# Patient Record
Sex: Male | Born: 1970
Health system: Southern US, Community
[De-identification: ages and names within clinical notes are randomized; demographics above are authoritative.]

## PROBLEM LIST (undated history)

## (undated) DIAGNOSIS — J45909 Unspecified asthma, uncomplicated: Secondary | ICD-10-CM

## (undated) DIAGNOSIS — E662 Morbid (severe) obesity with alveolar hypoventilation: Secondary | ICD-10-CM

## (undated) DIAGNOSIS — I5032 Chronic diastolic (congestive) heart failure: Secondary | ICD-10-CM

## (undated) DIAGNOSIS — J449 Chronic obstructive pulmonary disease, unspecified: Secondary | ICD-10-CM

## (undated) DIAGNOSIS — I509 Heart failure, unspecified: Secondary | ICD-10-CM

## (undated) DIAGNOSIS — E669 Obesity, unspecified: Secondary | ICD-10-CM

## (undated) DIAGNOSIS — I10 Essential (primary) hypertension: Secondary | ICD-10-CM

## (undated) HISTORY — DX: Essential (primary) hypertension: I10

## (undated) HISTORY — PX: NO PAST SURGERIES: SHX2092

## (undated) HISTORY — DX: Chronic diastolic (congestive) heart failure: I50.32

## (undated) HISTORY — DX: Morbid (severe) obesity with alveolar hypoventilation: E66.2

## (undated) NOTE — *Deleted (*Deleted)
NAME:  Joseph Morales, MRN:  [id number], DOB:  09-23-1971, LOS: 1 ADMISSION DATE:  10/16/2020, CONSULTATION DATE: 10/17/20 REFERRING MD:  Dr. Rhona Leavens, CHIEF COMPLAINT:  AMS   Brief History   6 y/o M admitted 10/19 with reports of increasing SOB, DOE, orthopnea and LE swelling thought secondary to decompensated diastolic CHF, OSA/OHS.    History of present illness   42 y/o M who presented to [facility] on 10/19 with reports of 1 week of increasing shortness of breath, dyspnea with exertion, inability to lie flat and LE swelling.   He has a PMH of diastolic CHF (LVEF 70-75% 05/2020), asthma, OHS, and obesity with BMI of 47.25.  He was admitted from 6/24-6/29/21 with acute hypoxic respiratory failure in the setting of dCHF and OHS.  He was diuresed 2L and was discharged on [place] O2 at 2L.   The patient reported on admit that he had been trying to follow his prescribed diet but had not as closely in the last few months.  Initial work up notable for UDS positive for THC, BNP 225, Trop 54, bicarb on BMP 36, WBC 21, Hgb 15.6 and platelets of 304.  CXR 10/19 with mild hilar fullness, interstitial edema.  He was admitted per [facility] for evaluation of suspected decompensated dCHF.  The patient was diuresed (no I/O's documented) and home antihypertensive regimen was continued.     On 10/20 am, RRT was called for evaluation of altered mental status. Per staff report, the patient's O2 was increased to 15L overnight to maintain saturations.  He was placed on NIV per the rapid response team.  RT at bedside, ABG assessed with pH 7.1 / pCO2 >90.  The patient was somnolent but aroused to deep pain.  Asterixis noted on exam.  He was transferred to ICU and placed on AVAPS.   10/21, Patient is more alert and oriented than 10/20. He states he has a CPAP at home but does not wear it because it's uncomfortable. He denies SOB and Chest pain.  Past Medical History  Obesity  Diastolic CHF - LVEF 70-75% Ashtma  Former Smoker - 10pk yr hx  HTN HLD  OHS  Significant Hospital Events   10/19 Admit  10/20 Tx to ICU for hypercarbic   Consults:    Procedures:    Significant Diagnostic Tests:  10/19 CTA Chest >> negative for PE, airway inflammation with mild bronchial wall thickening, small hiatal hernia, non-specific perinephric stranding within the visualized abdomen 10/21 CXR >> shallow inspiration. Infiltration or atelectasis in left lung base.  Micro Data:  COVID 10/19 >> negative  Influenza A/B 10/19 >> negative   Antimicrobials:    Interim history/subjective:  Afebrile / WBC 20.6  Glucose trend 84-103 I/O UOP recorded   Objective   Blood pressure (!) 158/61, pulse 83, temperature 97.7 F (36.5 C), temperature source Oral, resp. rate (!) 24, height 6\' 3"  (1.905 m), weight (!) 168.1 kg, SpO2 96 %.        Intake/Output Summary (Last 24 hours) at 10/18/2020 0831 Last data filed at 10/18/2020 0500 Gross per 24 hour  Intake -  Output 2560 ml  Net -2560 ml   Filed Weights   10/16/20 1319 10/17/20 0900 10/18/20 0500  Weight: (!) 171.5 kg (!) 172.3 kg (!) 168.1 kg    Examination: General: adult male lying in bed in NAD, somnolent HEENT: MM pink/dry, BiPAP mask in place, anicteric  Neuro: Alert to commands; follows commands CV: s1s2 rrr, no m/r/g PULM: non-labored on  5 liter O2, lungs distant bilaterally with scattered wheezing in right lung GI: soft, bsx4 active, No pain with deep palpation Extremities: warm/dry, 2+ pre-tibial edema  Skin: no rashes or lesions  Resolved Hospital Problem list     Assessment & Plan:   Acute on Chronic Hypoxic / Hypercarbic Respiratory Failure  Suspected OHS / OSA  In setting of suspected non-compliance / decompensated dCHF - Now on Nasal Cannula 5 liters: wean oxygen to O2 sats of 88-95% - Will attempt off BiPAP today as tolerated and place back on BiPAP if mental status worsens today. - BiPAP changed to prn and bedtime with AVAPS and back up rate  -  Increased Lasix to 60 mg BID; Also one time dose 40 mg IV ordered -CXR 10/21 shows LLL infiltrate or atelectasis: Pulmonary toiletry (IS and flutter valve) ordered tid and prn - will need sleep study at discharge. Consider nasal pillows with chin strap for patient compliance.  Acute Decompensation of Diastolic CHF - increased lasix 60 mg IV BID  -follow electrolytes closely with diuresis  -follow up BMP at 2000 to assess K  -monitor I/O's   Asthma without Exacerbation  -continue Dulera  - scheduled DuoNeb q6 and albuterol prn for wheezing  Tobacco Abuse  1ppd habit  -nicotine patch   Leukocytosis  -10/21 WBC 20.6 (gradually coming down) -UA negative -CXR 10/21 LLL infiltrate or atelectasis -if fever, consider blood cultures  -follow up CBC in morning  Hyperkalemia -Lokelma given 10/21 pm -Increased Lasix 60 mg IV BID and one time dose 40 mg IV lasix -recheck BMP later today -Consider 2nd dose lokelma -will consider adjusting losartan if hyperkalemia persist  HTN, HLD -Coreg increased to 6.25 due to HTN -resume home losartan, hydralazine, lasix, atorvastatin once able to take PO's   Small Hiatal Hernia  Noted on CT scan 10/19  -PPI    Best practice:  Diet: NPO except for sips with meds Pain/Anxiety/Delirium protocol (if indicated): n/a VAP protocol (if indicated): n/a DVT prophylaxis: lovenox  GI prophylaxis: PPI  Glucose control: per primary  Mobility: BR, mobilize as tolerated  Code Status: Full Code  Family Communication: *** Disposition: ICU  Labs   CBC: Recent Labs  Lab 10/16/20 1358 10/17/20 0424 10/18/20 0250  WBC 21.0* 23.1* 20.6*  NEUTROABS 15.6*  --   --   HGB 15.6 16.2 16.1  HCT 52.2* 55.2* 56.0*  MCV 100.6* 104.2* 103.5*  PLT 304 300 253    Basic Metabolic Panel: Recent Labs  Lab 10/16/20 1358 10/17/20 0424 10/17/20 2050 10/18/20 0250  NA 141 140 142 142  K 4.7 5.0 6.0* 5.5*  CL 96* 94* 94* 92*  CO2 36* 34* 38* 40*  GLUCOSE  120* 143* 100* 105*  BUN 27* 24* 28* 26*  CREATININE 1.15 1.01 1.11 1.14  CALCIUM 8.8* 8.7* 8.6* 8.9  MG  --  2.6*  --   --    GFR: Estimated Creatinine Clearance: 133.6 mL/min (by C-G formula based on SCr of 1.14 mg/dL). Recent Labs  Lab 10/16/20 1358 10/17/20 0424 10/18/20 0250  WBC 21.0* 23.1* 20.6*    Liver Function Tests: Recent Labs  Lab 10/16/20 1358  AST 25  ALT 39  ALKPHOS 57  BILITOT 1.8*  PROT 7.4  ALBUMIN 3.6   No results for input(s): LIPASE, AMYLASE in the last 168 hours. No results for input(s): AMMONIA in the last 168 hours.  ABG    Component Value Date/Time   PHART 7.158 (LL) 10/17/2020 1327  PCO2ART >120 (HH) 10/17/2020 1327   PO2ART 89.8 10/17/2020 1327   HCO3 41.9 (H) 10/17/2020 1327   O2SAT 95.5 10/17/2020 1327     Coagulation Profile: No results for input(s): INR, PROTIME in the last 168 hours.  Cardiac Enzymes: No results for input(s): CKTOTAL, CKMB, CKMBINDEX, TROPONINI in the last 168 hours.  HbA1C: Hgb A1c MFr Bld  Date/Time Value Ref Range Status  06/23/2020 05:02 AM 5.9 (H) 4.8 - 5.6 % Final    Comment:    (NOTE) Pre diabetes:          5.7%-6.4%  Diabetes:              >6.4%  Glycemic control for   <7.0% adults with diabetes     CBG: Recent Labs  Lab 10/17/20 1618 10/17/20 1949 10/17/20 2341 10/18/20 0414 10/18/20 0801  GLUCAP 70 105* 103* 92 84    Review of Systems:   Unable to complete as pt is altered / hypercarbic.   Past Medical History  He,  has a past medical history of Asthma, CHF (congestive heart failure) (HCC), and Obesity.   Surgical History    Past Surgical History:  Procedure Laterality Date  . NO PAST SURGERIES       Social History   reports that he has quit smoking. His smoking use included cigarettes. He has a 10.00 pack-year smoking history. He has never used smokeless tobacco. He reports current drug use. Drug: Marijuana. He reports that he does not drink alcohol.   Family History    His family history includes Breast cancer in his mother; Congestive Heart Failure in his maternal grandmother; Diabetes in his mother; Emphysema in his maternal grandmother.   Allergies No Known Allergies   Home Medications  Prior to Admission medications   Medication Sig Start Date End Date Taking? Authorizing Provider  albuterol (VENTOLIN HFA) 108 (90 Base) MCG/ACT inhaler Inhale 2 puffs into the lungs every 6 (six) hours as needed for wheezing or shortness of breath. 08/01/20  Yes Anders Simmonds, PA-C  aspirin 81 MG EC tablet Take 1 tablet (81 mg total) by mouth daily. Swallow whole. 08/01/20  Yes Georgian Co M, PA-C  atorvastatin (LIPITOR) 40 MG tablet Take 1 tablet (40 mg total) by mouth daily. 08/01/20 10/16/20 Yes Anders Simmonds, PA-C  carvedilol (COREG) 3.125 MG tablet Take 1 tablet (3.125 mg total) by mouth 2 (two) times daily with a meal. 08/01/20 10/16/20 Yes McClung, Angela M, PA-C  CINNAMON PO Take 1 capsule by mouth daily.   Yes [provider]  furosemide (LASIX) 40 MG tablet Take 1 tablet (40 mg total) by mouth 2 (two) times daily. 08/01/20 10/16/20 Yes McClung, Marzella Schlein, PA-C  hydrALAZINE (APRESOLINE) 50 MG tablet Take 1 tablet (50 mg total) by mouth in the morning and at bedtime. 08/01/20 10/16/20 Yes McClung, Marzella Schlein, PA-C  losartan (COZAAR) 50 MG tablet Take 1 tablet (50 mg total) by mouth daily. 08/01/20 10/16/20 Yes McClung, Marzella Schlein, PA-C  mometasone-formoterol (DULERA) 100-5 MCG/ACT AERO Inhale 2 puffs into the lungs 2 (two) times daily. 08/01/20  Yes McClung, Angela M, PA-C  nicotine (NICODERM CQ - DOSED IN MG/24 HOURS) 21 mg/24hr patch Place 1 patch (21 mg total) onto the skin daily. 06/27/20  Yes Briant Cedar, MD     Critical care time: 56 minutes      Canary Brim, MSN, NP-C, AGACNP-BC [facility] 10/18/2020, 8:31 AM   Please see [email or web address] for pager  details.

## (undated) NOTE — *Deleted (*Deleted)
NAME:  Tramell Piechota, MRN:  [id number], DOB:  [birth year], LOS: 0 ADMISSION DATE:  10/16/2020, CONSULTATION DATE:  *** REFERRING MD:  ***, CHIEF COMPLAINT:  ***   Brief History   33 yo male who presented to [facility] ED with increasing SOB, DOE, orthopnea, and leg swelling over the past week. Likely related to history of CHF, OHS, and asthma.  History of present illness   Patient is a 26 yo male with PMH of diastolic CHF (EF 40-98% by TTE 06/22/2020), asthma, OHS, obesity (BMI 47.25) presents to the [facility] ED on 10/16/2020 with SOB, DOE, orthopnea, and leg swelling. Patient got progressively SOB and fatigued with exertion. Having to sleep sitting up and snores at night. He is having increasing daytime fatigue and HA. He thinks he gained 30 lbs since last admission. Takes Lasix for CHF.   Patient was hospitalized on 06/21/2020-06/26/2020 for hypoxia likely due to diastolic CHF and OHS. He was diuresed and required 2 liters O2 [place] on discharge. Patient admits trying to follow dietary recommendations upon discharge but had a poor diet the last few months.  10/17/2020 Critical Care team called to floor for a rapid response this morning. RT found patient unresponsive on cpap. Withdrawal, disoriented response to sternal rub. O2 sats in the 80s. Assuming patient is obtunded and high CO2. I-Stat blood gas read CO2 >90 and pH of 7.13. Patient was placed on V-60 BiPAP with AVAPS mode and RR of 20 and 60% FiO2. Sats improved to mid 90s. Volume's are improving. Will admit to ICU and repeat ABG in a few hours to reassess if intubation is warranted.   Past Medical History  Obesity CHF Asthma Former smoker - 10 PYH ?HTN ?HLD ?OHS  Significant Hospital Events   10/19 admit to [facility] 10/20 admit to ICU  Consults:  PCCM  Procedures:    Significant Diagnostic Tests:  CXR 10/16/2020 >> No significant findings CTA chest PE 10/16/2020 >> No acute PE. Airway inflammation with mild bronchial wall thickening. Small hiatal  hernia.   Micro Data:  COVID 10/19 >> negative Flu 10/19 >> negative  Antimicrobials:  None  Interim history/subjective:  Tmax 99.3 / WBC 23.1[place] Glucose 143 this morning I/O -350 UOP On CPAP on the floors; rapid response called due to hypoxia and unresponsive to sternal rub. I-STAT blood gas drawn with PCO2 greater than 97 and Ph 7.13. Patient placed on AVAPS with a rate of 20 to help blow off CO2 and avoid intubation. 40 IV lasix given. Awaiting ICU bed.    Objective   Blood pressure (!) 145/70, pulse 93, temperature 97.7 F (36.5 C), temperature source Oral, resp. rate 20, height 6\' 3"  (1.905 m), weight (!) 171.5 kg, SpO2 97 %.        Intake/Output Summary (Last 24 hours) at 10/17/2020 0843 Last data filed at 10/17/2020 0600 Gross per 24 hour  Intake 0 ml  Output 350 ml  Net -350 ml   Filed Weights   10/16/20 1319  Weight: (!) 171.5 kg    Examination:  General: obtunded on CPAP HENT: MM moist Lungs: Diminished BS bilaterally Cardiovascular: s1s2 RRR, no m/r/g Abdomen: soft, ND, No abdominal pain Extremities: pre tibial pitting edema; DP pulses 2+ bilaterally; possible asterixis appreciated in LUE Neuro: unable to follow commands; withdrawal to pain;    Resolved Hospital Problem list   ***  Assessment & Plan:    Acute on chronic respiratory failure with hypoxia Acute on chronic diastolic CHF exacerbation Presumed OSA/OHS - Leave  patient on BiPAP on AVAPS with rate of 20 to blow off CO2. Wean FiO2 to keep sats 88-95% - Repeat ABG in 2 hours - Worsening ABG consider intubation - Improving ABG will leave patient on BiPAP today and tonight and will give breaks as tolerated. - STAT 40 mg IV Lasix given; continue 40 mg IV lasix BID - Repeat CXR - Saline lock 10/20; Monitor strict I/O's and daily weights - Repeat CMP, CBC in the morning - Patient will need a sleep study upon discharge for probable OSA/OHS  Asthma: - No wheezing appreciated today. Will  consider changing Dulera to nebs if not off BiPAP by tonight. Alb prn for wheezing.  Leukocytosis: - No evidence of infection. Tmax 99.3. No IV steroids. Maybe secondary to cigarette smoking. Peripheral smear sent. - Ordering UA - ?Consider blood cultures  - Repeat CBC in the morning  Hypertension: - Resume home Coreg, losartan, hydralazine, lasix - Will change from oral to IV route of administration depending on status of patient  Hyperlipidemia: - Continue atorvastatin.  Tobacco use: - Reports usually smoking 1 pack/day, has not smoked the last 2 weeks. 20 PYH. - Prescribing Nicotine Patch  Small Hiatal Hernia - Seen on CT on 10/19 - Likely non-surgical; will order PPI  Best practice:  Diet: NPO Pain/Anxiety/Delirium protocol (if indicated): None VAP protocol (if indicated): None DVT prophylaxis: Lovenox GI prophylaxis: Zofran Glucose control: None Mobility: bedrest Code Status: Full Family Communication: ***  Disposition: ICU  Labs   CBC: Recent Labs  Lab 10/16/20 1358 10/17/20 0424  WBC 21.0* 23.1*  NEUTROABS 15.6*  --   HGB 15.6 16.2  HCT 52.2* 55.2*  MCV 100.6* 104.2*  PLT 304 300    Basic Metabolic Panel: Recent Labs  Lab 10/16/20 1358 10/17/20 0424  NA 141 140  K 4.7 5.0  CL 96* 94*  CO2 36* 34*  GLUCOSE 120* 143*  BUN 27* 24*  CREATININE 1.15 1.01  CALCIUM 8.8* 8.7*  MG  --  2.6*   GFR: Estimated Creatinine Clearance: 152.6 mL/min (by C-G formula based on SCr of 1.01 mg/dL). Recent Labs  Lab 10/16/20 1358 10/17/20 0424  WBC 21.0* 23.1*    Liver Function Tests: Recent Labs  Lab 10/16/20 1358  AST 25  ALT 39  ALKPHOS 57  BILITOT 1.8*  PROT 7.4  ALBUMIN 3.6   No results for input(s): LIPASE, AMYLASE in the last 168 hours. No results for input(s): AMMONIA in the last 168 hours.  ABG No results found for: PHART, PCO2ART, PO2ART, HCO3, TCO2, ACIDBASEDEF, O2SAT   Coagulation Profile: No results for input(s): INR, PROTIME in  the last 168 hours.  Cardiac Enzymes: No results for input(s): CKTOTAL, CKMB, CKMBINDEX, TROPONINI in the last 168 hours.  HbA1C: Hgb A1c MFr Bld  Date/Time Value Ref Range Status  06/23/2020 05:02 AM 5.9 (H) 4.8 - 5.6 % Final    Comment:    (NOTE) Pre diabetes:          5.7%-6.4%  Diabetes:              >6.4%  Glycemic control for   <7.0% adults with diabetes     CBG: No results for input(s): GLUCAP in the last 168 hours.  Review of Systems:   ***  Past Medical History  He,  has a past medical history of Asthma, CHF (congestive heart failure) (HCC), and Obesity.   Surgical History    Past Surgical History:  Procedure Laterality Date  . NO  PAST SURGERIES       Social History   reports that he has quit smoking. His smoking use included cigarettes. He has a 10.00 pack-year smoking history. He has never used smokeless tobacco. He reports current drug use. Drug: Marijuana. He reports that he does not drink alcohol.   Family History   His family history includes Breast cancer in his mother; Congestive Heart Failure in his maternal grandmother; Diabetes in his mother; Emphysema in his maternal grandmother.   Allergies No Known Allergies   Home Medications  Prior to Admission medications   Medication Sig Start Date End Date Taking? Authorizing Provider  albuterol (VENTOLIN HFA) 108 (90 Base) MCG/ACT inhaler Inhale 2 puffs into the lungs every 6 (six) hours as needed for wheezing or shortness of breath. 08/01/20  Yes Anders Simmonds, PA-C  aspirin 81 MG EC tablet Take 1 tablet (81 mg total) by mouth daily. Swallow whole. 08/01/20  Yes Georgian Co M, PA-C  atorvastatin (LIPITOR) 40 MG tablet Take 1 tablet (40 mg total) by mouth daily. 08/01/20 10/16/20 Yes Anders Simmonds, PA-C  carvedilol (COREG) 3.125 MG tablet Take 1 tablet (3.125 mg total) by mouth 2 (two) times daily with a meal. 08/01/20 10/16/20 Yes McClung, Angela M, PA-C  CINNAMON PO Take 1 capsule by mouth daily.    Yes [provider]  furosemide (LASIX) 40 MG tablet Take 1 tablet (40 mg total) by mouth 2 (two) times daily. 08/01/20 10/16/20 Yes McClung, Marzella Schlein, PA-C  hydrALAZINE (APRESOLINE) 50 MG tablet Take 1 tablet (50 mg total) by mouth in the morning and at bedtime. 08/01/20 10/16/20 Yes McClung, Marzella Schlein, PA-C  losartan (COZAAR) 50 MG tablet Take 1 tablet (50 mg total) by mouth daily. 08/01/20 10/16/20 Yes McClung, Marzella Schlein, PA-C  mometasone-formoterol (DULERA) 100-5 MCG/ACT AERO Inhale 2 puffs into the lungs 2 (two) times daily. 08/01/20  Yes McClung, Angela M, PA-C  nicotine (NICODERM CQ - DOSED IN MG/24 HOURS) 21 mg/24hr patch Place 1 patch (21 mg total) onto the skin daily. 06/27/20  Yes Briant Cedar, MD     Critical care time: ***

---

## 2020-06-21 ENCOUNTER — Emergency Department (HOSPITAL_COMMUNITY): Payer: Self-pay

## 2020-06-21 ENCOUNTER — Encounter (HOSPITAL_COMMUNITY): Payer: Self-pay | Admitting: Emergency Medicine

## 2020-06-21 ENCOUNTER — Other Ambulatory Visit: Payer: Self-pay

## 2020-06-21 ENCOUNTER — Inpatient Hospital Stay (HOSPITAL_COMMUNITY)
Admission: EM | Admit: 2020-06-21 | Discharge: 2020-06-26 | DRG: 291 | Disposition: A | Discharge status: Home / Self Care | Payer: Self-pay | Attending: Internal Medicine | Admitting: Internal Medicine

## 2020-06-21 DIAGNOSIS — Z20822 Contact with and (suspected) exposure to covid-19: Secondary | ICD-10-CM | POA: Diagnosis present

## 2020-06-21 DIAGNOSIS — M79669 Pain in unspecified lower leg: Secondary | ICD-10-CM | POA: Diagnosis present

## 2020-06-21 DIAGNOSIS — J45909 Unspecified asthma, uncomplicated: Secondary | ICD-10-CM | POA: Diagnosis present

## 2020-06-21 DIAGNOSIS — I110 Hypertensive heart disease with heart failure: Principal | ICD-10-CM | POA: Diagnosis present

## 2020-06-21 DIAGNOSIS — Z6841 Body Mass Index (BMI) 40.0 and over, adult: Secondary | ICD-10-CM

## 2020-06-21 DIAGNOSIS — L03116 Cellulitis of left lower limb: Secondary | ICD-10-CM | POA: Diagnosis present

## 2020-06-21 DIAGNOSIS — F17210 Nicotine dependence, cigarettes, uncomplicated: Secondary | ICD-10-CM | POA: Diagnosis present

## 2020-06-21 DIAGNOSIS — I872 Venous insufficiency (chronic) (peripheral): Secondary | ICD-10-CM | POA: Diagnosis present

## 2020-06-21 DIAGNOSIS — Z9119 Patient's noncompliance with other medical treatment and regimen: Secondary | ICD-10-CM

## 2020-06-21 DIAGNOSIS — D72829 Elevated white blood cell count, unspecified: Secondary | ICD-10-CM | POA: Diagnosis present

## 2020-06-21 DIAGNOSIS — I10 Essential (primary) hypertension: Secondary | ICD-10-CM

## 2020-06-21 DIAGNOSIS — L03115 Cellulitis of right lower limb: Secondary | ICD-10-CM | POA: Diagnosis present

## 2020-06-21 DIAGNOSIS — I5033 Acute on chronic diastolic (congestive) heart failure: Secondary | ICD-10-CM | POA: Diagnosis present

## 2020-06-21 DIAGNOSIS — L0390 Cellulitis, unspecified: Secondary | ICD-10-CM | POA: Diagnosis present

## 2020-06-21 DIAGNOSIS — Z79899 Other long term (current) drug therapy: Secondary | ICD-10-CM

## 2020-06-21 DIAGNOSIS — E785 Hyperlipidemia, unspecified: Secondary | ICD-10-CM | POA: Diagnosis present

## 2020-06-21 DIAGNOSIS — R0601 Orthopnea: Secondary | ICD-10-CM | POA: Diagnosis present

## 2020-06-21 DIAGNOSIS — I160 Hypertensive urgency: Secondary | ICD-10-CM | POA: Diagnosis present

## 2020-06-21 DIAGNOSIS — R600 Localized edema: Secondary | ICD-10-CM

## 2020-06-21 DIAGNOSIS — Z0189 Encounter for other specified special examinations: Secondary | ICD-10-CM

## 2020-06-21 DIAGNOSIS — R0902 Hypoxemia: Secondary | ICD-10-CM | POA: Diagnosis present

## 2020-06-21 DIAGNOSIS — R7303 Prediabetes: Secondary | ICD-10-CM | POA: Diagnosis present

## 2020-06-21 DIAGNOSIS — E873 Alkalosis: Secondary | ICD-10-CM | POA: Diagnosis present

## 2020-06-21 DIAGNOSIS — J9601 Acute respiratory failure with hypoxia: Secondary | ICD-10-CM | POA: Diagnosis present

## 2020-06-21 DIAGNOSIS — E662 Morbid (severe) obesity with alveolar hypoventilation: Secondary | ICD-10-CM | POA: Diagnosis present

## 2020-06-21 DIAGNOSIS — I5032 Chronic diastolic (congestive) heart failure: Secondary | ICD-10-CM | POA: Diagnosis present

## 2020-06-21 DIAGNOSIS — Z825 Family history of asthma and other chronic lower respiratory diseases: Secondary | ICD-10-CM

## 2020-06-21 DIAGNOSIS — Z8249 Family history of ischemic heart disease and other diseases of the circulatory system: Secondary | ICD-10-CM

## 2020-06-21 DIAGNOSIS — Z833 Family history of diabetes mellitus: Secondary | ICD-10-CM

## 2020-06-21 DIAGNOSIS — I5031 Acute diastolic (congestive) heart failure: Secondary | ICD-10-CM

## 2020-06-21 HISTORY — DX: Heart failure, unspecified: I50.9

## 2020-06-21 HISTORY — DX: Unspecified asthma, uncomplicated: J45.909

## 2020-06-21 LAB — COMPREHENSIVE METABOLIC PANEL
ALT: 22 U/L (ref 0–44)
AST: 22 U/L (ref 15–41)
Albumin: 4.2 g/dL (ref 3.5–5.0)
Alkaline Phosphatase: 65 U/L (ref 38–126)
Anion gap: 10 (ref 5–15)
BUN: 13 mg/dL (ref 6–20)
CO2: 35 mmol/L — ABNORMAL HIGH (ref 22–32)
Calcium: 9 mg/dL (ref 8.9–10.3)
Chloride: 97 mmol/L — ABNORMAL LOW (ref 98–111)
Creatinine, Ser: 1.05 mg/dL (ref 0.61–1.24)
GFR calc Af Amer: >60 mL/min (ref >60)
GFR calc non Af Amer: >60 mL/min (ref >60)
Glucose, Bld: 75 mg/dL (ref 70–99)
Potassium: 4.6 mmol/L (ref 3.5–5.1)
Sodium: 142 mmol/L (ref 135–145)
Total Bilirubin: 1.1 mg/dL (ref 0.3–1.2)
Total Protein: 8 g/dL (ref 6.5–8.1)

## 2020-06-21 LAB — CBC WITH DIFFERENTIAL/PLATELET
Abs Immature Granulocytes: 0.13 10*3/uL — ABNORMAL HIGH (ref 0.00–0.07)
Basophils Absolute: 0.1 10*3/uL (ref 0.0–0.1)
Basophils Relative: 1 %
Eosinophils Absolute: 0.4 10*3/uL (ref 0.0–0.5)
Eosinophils Relative: 3 %
HCT: 55 % — ABNORMAL HIGH (ref 39.0–52.0)
Hemoglobin: 16.5 g/dL (ref 13.0–17.0)
Immature Granulocytes: 1 %
Lymphocytes Relative: 16 %
Lymphs Abs: 2.5 10*3/uL (ref 0.7–4.0)
MCH: 29.8 pg (ref 26.0–34.0)
MCHC: 30 g/dL (ref 30.0–36.0)
MCV: 99.5 fL (ref 80.0–100.0)
Monocytes Absolute: 1.5 10*3/uL — ABNORMAL HIGH (ref 0.1–1.0)
Monocytes Relative: 10 %
Neutro Abs: 10.5 10*3/uL — ABNORMAL HIGH (ref 1.7–7.7)
Neutrophils Relative %: 69 %
Platelets: 218 10*3/uL (ref 150–400)
RBC: 5.53 MIL/uL (ref 4.22–5.81)
RDW: 13.5 % (ref 11.5–15.5)
WBC: 15 10*3/uL — ABNORMAL HIGH (ref 4.0–10.5)
nRBC: 0 % (ref 0.0–0.2)

## 2020-06-21 LAB — TROPONIN I (HIGH SENSITIVITY)
Troponin I (High Sensitivity): 7 ng/L (ref <18)
Troponin I (High Sensitivity): 8 ng/L (ref <18)
Troponin I (High Sensitivity): 6 ng/L (ref <18)

## 2020-06-21 LAB — C-REACTIVE PROTEIN: CRP: 1 mg/dL — ABNORMAL HIGH (ref <1.0)

## 2020-06-21 LAB — SEDIMENTATION RATE: Sed Rate: 3 mm/hr (ref 0–16)

## 2020-06-21 LAB — BRAIN NATRIURETIC PEPTIDE: B Natriuretic Peptide: 33.9 pg/mL (ref 0.0–100.0)

## 2020-06-21 LAB — SARS CORONAVIRUS 2 BY RT PCR (HOSPITAL ORDER, PERFORMED IN ~~LOC~~ HOSPITAL LAB): SARS Coronavirus 2: NEGATIVE

## 2020-06-21 LAB — MAGNESIUM: Magnesium: 2 mg/dL (ref 1.7–2.4)

## 2020-06-21 LAB — TSH: TSH: 0.438 u[IU]/mL (ref 0.350–4.500)

## 2020-06-21 MED ORDER — LOSARTAN POTASSIUM 50 MG PO TABS
50.0000 mg | ORAL_TABLET | Freq: Every day | ORAL | Status: DC
  Administered 2020-06-21 – 2020-06-26 (×6): 50 mg via ORAL
  Filled 2020-06-21 (×6): qty 1

## 2020-06-21 MED ORDER — FUROSEMIDE 10 MG/ML IJ SOLN: 20.0000 mg | Freq: Once | INTRAMUSCULAR | Status: DC

## 2020-06-21 MED ORDER — ASPIRIN EC 81 MG PO TBEC
81.0000 mg | DELAYED_RELEASE_TABLET | Freq: Every day | ORAL | Status: DC
  Administered 2020-06-22 – 2020-06-26 (×5): 81 mg via ORAL
  Filled 2020-06-21 (×5): qty 1

## 2020-06-21 MED ORDER — ACETAMINOPHEN 325 MG PO TABS: 650.0000 mg | ORAL_TABLET | ORAL | Status: DC | PRN

## 2020-06-21 MED ORDER — ONDANSETRON HCL 4 MG/2ML IJ SOLN: 4.0000 mg | Freq: Four times a day (QID) | INTRAMUSCULAR | Status: DC | PRN

## 2020-06-21 MED ORDER — SODIUM CHLORIDE 0.9% FLUSH
3.0000 mL | Freq: Two times a day (BID) | INTRAVENOUS | Status: DC
  Administered 2020-06-22 – 2020-06-26 (×10): 3 mL via INTRAVENOUS

## 2020-06-21 MED ORDER — SODIUM CHLORIDE 0.9 % IV SOLN: 250.0000 mL | INTRAVENOUS | Status: DC | PRN

## 2020-06-21 MED ORDER — FUROSEMIDE 10 MG/ML IJ SOLN
80.0000 mg | Freq: Two times a day (BID) | INTRAMUSCULAR | Status: DC
  Administered 2020-06-22 – 2020-06-26 (×9): 80 mg via INTRAVENOUS
  Filled 2020-06-21 (×9): qty 8

## 2020-06-21 MED ORDER — MOMETASONE FURO-FORMOTEROL FUM 100-5 MCG/ACT IN AERO
2.0000 | INHALATION_SPRAY | Freq: Two times a day (BID) | RESPIRATORY_TRACT | Status: DC
  Administered 2020-06-21 – 2020-06-26 (×9): 2 via RESPIRATORY_TRACT
  Filled 2020-06-21: qty 8.8

## 2020-06-21 MED ORDER — SODIUM CHLORIDE 0.9% FLUSH: 3.0000 mL | INTRAVENOUS | Status: DC | PRN

## 2020-06-21 MED ORDER — FUROSEMIDE 10 MG/ML IJ SOLN
40.0000 mg | Freq: Once | INTRAMUSCULAR | Status: AC
  Administered 2020-06-21: 40 mg via INTRAVENOUS
  Filled 2020-06-21: qty 4

## 2020-06-21 MED ORDER — ENOXAPARIN SODIUM 40 MG/0.4ML ~~LOC~~ SOLN
40.0000 mg | SUBCUTANEOUS | Status: DC
Start: 2020-06-21 — End: 2020-06-22

## 2020-06-21 MED ORDER — ALBUTEROL SULFATE (2.5 MG/3ML) 0.083% IN NEBU: 2.5000 mg | INHALATION_SOLUTION | RESPIRATORY_TRACT | Status: DC | PRN

## 2020-06-21 MED ORDER — CEPHALEXIN 500 MG PO CAPS
500.0000 mg | ORAL_CAPSULE | Freq: Four times a day (QID) | ORAL | Status: DC
  Administered 2020-06-21 – 2020-06-26 (×19): 500 mg via ORAL
  Filled 2020-06-21 (×19): qty 1

## 2020-06-21 MED ORDER — CARVEDILOL 3.125 MG PO TABS
3.1250 mg | ORAL_TABLET | Freq: Two times a day (BID) | ORAL | Status: DC
  Administered 2020-06-22 – 2020-06-26 (×9): 3.125 mg via ORAL
  Filled 2020-06-21 (×9): qty 1

## 2020-06-21 NOTE — ED Provider Notes (Signed)
[place] DEPT Provider Note   CSN: [id number] Arrival date & time: 06/21/20  1454    History Chief Complaint  Patient presents with  . Leg Swelling    Joseph Morales is a 49 y.o. male with past medical history who presents for evaluation of lower extremity edema.  Patient states he has had worsening lower extremity edema times months.  Admits to PND and orthopnea.  States he is unable to lay flat at night and must sit up in a recliner.  States he intermittently feels short of breath.  No unilateral leg swelling, redness or warmth.  No chest pain.  Denies fever, chills, nausea, vomiting, abdominal pain, diarrhea, dysuria.  Denies additional aggravating or alleviating factors.  He was seen at [facility] in 2020 for lower extremity edema and hypoxia.  Was started on Lasix however he is noncompliant does not follow with PCP.  History obtained from patient and past medical records.  No interpreter is used.  HPI     Past Medical History:  Diagnosis Date  . CHF (congestive heart failure) (HCC)     There are no problems to display for this patient.   History reviewed. No pertinent surgical history.     No family history on file.  Social History   Tobacco Use  . Smoking status: Not on file  Substance Use Topics  . Alcohol use: Not on file  . Drug use: Not on file    Home Medications Prior to Admission medications   Medication Sig Start Date End Date Taking? Authorizing Provider  albuterol (VENTOLIN HFA) 108 (90 Base) MCG/ACT inhaler Inhale 2 puffs into the lungs daily as needed for wheezing or shortness of breath.  04/27/20  Yes [provider]  CINNAMON PO Take 1 capsule by mouth daily.   Yes [provider]    Allergies    Patient has no known allergies.  Review of Systems   Review of Systems  Constitutional: Negative.   HENT: Negative.   Respiratory: Positive for shortness of breath. Negative for apnea, cough,  choking, chest tightness, wheezing and stridor.   Cardiovascular: Positive for leg swelling. Negative for chest pain and palpitations.  Gastrointestinal: Negative.   Genitourinary: Negative.   Musculoskeletal: Negative.   Skin: Negative.   Neurological: Negative.   All other systems reviewed and are negative.   Physical Exam Updated Vital Signs BP (!) 204/118 (BP Location: Left Arm)   Pulse 95   Temp 98.3 F (36.8 C) (Oral)   Resp (!) 18   SpO2 97%   Physical Exam Vitals and nursing note reviewed.  Constitutional:      General: He is not in acute distress.    Appearance: He is well-developed. He is not ill-appearing, toxic-appearing or diaphoretic.  HENT:     Head: Normocephalic and atraumatic.     Nose: Nose normal.     Mouth/Throat:     Mouth: Mucous membranes are moist.  Eyes:     Pupils: Pupils are equal, round, and reactive to light.  Cardiovascular:     Rate and Rhythm: Regular rhythm. Tachycardia present.     Heart sounds: Normal heart sounds.     Comments: HR 100 in room Pulmonary:     Effort: Pulmonary effort is normal. No respiratory distress.     Comments: Mild bibasilar crackles. Speaks in full sentences without difficulty Chest:     Chest wall: No tenderness.  Abdominal:     General: Bowel sounds are normal.  There is no distension.     Palpations: Abdomen is soft. There is no mass.     Tenderness: There is no abdominal tenderness. There is no right CVA tenderness, left CVA tenderness or guarding.  Musculoskeletal:        General: Normal range of motion.     Cervical back: Normal range of motion and neck supple.     Right lower leg: Edema present.     Left lower leg: Edema present.     Comments: Moves all 4 extremities without difficulty. Compartments soft. No bony tenderness. Homans sign negative  Skin:    General: Skin is warm and dry.     Capillary Refill: Capillary refill takes 2 to 3 seconds.     Comments: 2-3+ pitting edema to BLE. Chronic venous  stasis skin changes. No erythema, warmth. No fluctuance of induration.  Neurological:     Mental Status: He is alert.     Comments: CN 2-12 grossly intact. Ambulatory without difficulty.     ED Results / Procedures / Treatments   Labs (all labs ordered are listed, but only abnormal results are displayed) Labs Reviewed  CBC WITH DIFFERENTIAL/PLATELET - Abnormal; Notable for the following components:      Result Value   WBC 15.0 (*)    HCT 55.0 (*)    Neutro Abs 10.5 (*)    Monocytes Absolute 1.5 (*)    Abs Immature Granulocytes 0.13 (*)    All other components within normal limits  SARS CORONAVIRUS 2 BY RT PCR (HOSPITAL ORDER, PERFORMED IN [facility])  COMPREHENSIVE METABOLIC PANEL  BRAIN NATRIURETIC PEPTIDE  TROPONIN I (HIGH SENSITIVITY)    EKG EKG Interpretation  Date/Time:  Thursday June 21 2020 16:48:15 EDT Ventricular Rate:  100 PR Interval:    QRS Duration: 91 QT Interval:  350 QTC Calculation: 452 R Axis:   94 Text Interpretation: Sinus tachycardia Borderline right axis deviation ST elevation suggests acute pericarditis No previous ECGs available Confirmed by Richardean Canal [phone]) on 06/21/2020 4:54:34 PM   Radiology DG Chest 2 View  Result Date: 06/21/2020 CLINICAL DATA:  Peripheral edema and hypoxia EXAM: CHEST - 2 VIEW COMPARISON:  None. FINDINGS: 10/05/2019 IMPRESSION: No active cardiopulmonary disease. Electronically Signed   By: Jasmine Pang M.D.   On: 06/21/2020 16:36   Procedures Procedures (including critical care time)  Medications Ordered in ED Medications  furosemide (LASIX) injection 40 mg (40 mg Intravenous Given 06/21/20 1658)   ED Course  I have reviewed the triage vital signs and the nursing notes.  Pertinent labs & imaging results that were available during my care of the patient were reviewed by me and considered in my medical decision making (see chart for details).  49 year old male presents for evaluation of bilateral  lower extremity edema.  Afebrile, nonseptic, not ill-appearing.  No cough, fever or chills to suggest Covid.  Admits to PND, orthopnea.  He is hypoxic on arrival to 89% on room air.  Placed on 2 L with significant improvement.  He has 2-3+ bilateral pitting edema to lower extremities to knees.  Also with chronic venous stasis skin changes.  No unilateral leg swelling, redness, warmth, recent surgery, immobilization, hypoxia. No prior history of PE or DVT.  Patient symptoms seem consistent with CHF.  He has no exertional chest pain.  I have low suspicion for ACS, PE, dissection.  Plan on labs, imaging and reassess: Chest x-ray without acute findings CBC with mild leukocytosis at 15.2 EKG  without STEMI  Care transferred to [facility] who will follow up on remaining labs.  If continues to be hypoxic patient will need to be admitted for further work-up, likely echo. Hypertensive however will recheck in 30 minutes prior to giving BP meds. Question diastolic heart failure?  Patient seen evaluated by attending, Dr. Silverio Lay who agrees above treatment, plan and disposition.   .  MDM Rules/Calculators/A&P                           Final Clinical Impression(s) / ED Diagnoses Final diagnoses:  Hypoxia  Bilateral lower extremity edema  Hypertension, unspecified type    Rx / DC Orders ED Discharge Orders    None       Nastasia Kage A, PA-C 06/21/20 1757    Charlynne Pander, MD 06/25/20 [phone]

## 2020-06-21 NOTE — ED Notes (Signed)
Patient transported to X-ray 

## 2020-06-21 NOTE — ED Triage Notes (Signed)
Pt states that he saw a doctor couple months ago and told has CHF. Pt c/o bilat leg swelling for 2 months. Reports calfs are really tight. Pt O2 ranging 88-90% on RA. Pt placed on 2l O2 via nasal cannula in triage.

## 2020-06-21 NOTE — ED Notes (Signed)
Patient informed the writer that he has urinated a whole bottle full, but had his mother to empty it. Patient informed to call the nurse so we could measure since he received Lasix.

## 2020-06-21 NOTE — H&P (Signed)
[facility] History and Physical   Patient: Joseph Morales BLT:[id number]   PCP: Patient, No Pcp Per DOB: [birth year]   DOA: 06/21/2020   DOS: 06/21/2020   DOS: the patient was seen and examined on 06/21/2020  Patient coming from: The patient is coming from Home  Chief Complaint: Shortness of breath  HPI: Joseph Morales is a 49 y.o. male with Past medical history of asthma and obesity, essential hypertension. Patient presented with complaints of progressively worsening shortness of breath ongoing for last 1 year worsening for last 1 week.  Patient denies having any chest placement chest pain, no nausea no, vomiting.  No fever no chills. He has chronic leg swelling which is also getting worse for last 1 week. He mentions that he has discoloration of his legs chronically but it is getting more worse in the last 1 week as well but Denies having any PCP or taking any medication. Tells me that he is sleeping on couch due to difficulty lying flat for almost a year. Tells me that he wakes up tired and sleeps easily during the day. No nausea no vomiting. He also reports bilateral leg pain. He denies any drug abuse or alcohol abuse. Occasional smoker.  ED Course: Presents with above complaint.  Found to have acute diastolic CHF and volume overload and was referred for admission.  At his baseline ambulates without assistance independent for most of his ADL;  manages his medication on his own.  Review of Systems: as mentioned in the history of present illness.  All other systems reviewed and are negative.  Past Medical History:  Diagnosis Date  . Asthma   . CHF (congestive heart failure) ([place])    Past Surgical History:  Procedure Laterality Date  . NO PAST SURGERIES     Social History:  reports that he has been smoking cigarettes. He has a 10.00 pack-year smoking history. He has never used smokeless tobacco. He reports current drug use. Drug: Marijuana. He reports that he does not drink  alcohol.  No Known Allergies  Family history reviewed and not pertinent Family History  Problem Relation Age of Onset  . Breast cancer Mother   . Diabetes Mother   . Emphysema Maternal Grandmother   . Congestive Heart Failure Maternal Grandmother      Prior to Admission medications   Medication Sig Start Date End Date Taking? Authorizing Provider  albuterol (VENTOLIN HFA) 108 (90 Base) MCG/ACT inhaler Inhale 2 puffs into the lungs daily as needed for wheezing or shortness of breath.  04/27/20  Yes [provider]  CINNAMON PO Take 1 capsule by mouth daily.   Yes [provider]  UNABLE TO FIND Med Name: inhaler for asthma   Yes [provider]    Physical Exam: Vitals:   06/21/20 1858 06/21/20 1930 06/21/20 2032 06/21/20 2130  BP: (!) 161/85 (!) 143/78 (!) 173/86 (!) 167/90  Pulse: 96 96 100 96  Resp: (!) 23 (!) 29 (!) 30 (!) 30  Temp:      TempSrc:      SpO2: 95% 94% 93% 96%    General: alert and oriented to time, place, and person. Appear in moderate distress, affect appropriate Eyes: PERRL, Conjunctiva normal ENT: Oral Mucosa Clear, moist  Neck: difficult to assess  JVD, no Abnormal Mass Or lumps Cardiovascular: S1 and S2 Present, no Murmur, peripheral pulses symmetrical Respiratory: increased respiratory effort, Bilateral Air entry equal and Decreased, no signs of accessory muscle use, bilateral  Crackles, bilateral  wheezes Abdomen: Bowel Sound  sent, Soft and no tenderness, no hernia Skin: no rashes  Extremities: Bilateral erythema, bilateral pedal edema, bilateral calf tenderness Neurologic: without any new focal findings Gait not checked due to patient safety concerns  Data Reviewed: I have personally reviewed and interpreted labs, imaging as discussed below.  CBC: Recent Labs  Lab 06/21/20 1704  WBC 15.0*  NEUTROABS 10.5*  HGB 16.5  HCT 55.0*  MCV 99.5  PLT 865   Basic Metabolic Panel: Recent Labs  Lab 06/21/20 1704  NA  142  K 4.6  CL 97*  CO2 35*  GLUCOSE 75  BUN 13  CREATININE 1.05  CALCIUM 9.0   GFR: CrCl cannot be calculated (Unknown ideal weight.). Liver Function Tests: Recent Labs  Lab 06/21/20 1704  AST 22  ALT 22  ALKPHOS 65  BILITOT 1.1  PROT 8.0  ALBUMIN 4.2   No results for input(s): LIPASE, AMYLASE in the last 168 hours. No results for input(s): AMMONIA in the last 168 hours. Coagulation Profile: No results for input(s): INR, PROTIME in the last 168 hours. Cardiac Enzymes: No results for input(s): CKTOTAL, CKMB, CKMBINDEX, TROPONINI in the last 168 hours. BNP (last 3 results) No results for input(s): PROBNP in the last 8760 hours. HbA1C: No results for input(s): HGBA1C in the last 72 hours. CBG: No results for input(s): GLUCAP in the last 168 hours. Lipid Profile: No results for input(s): CHOL, HDL, LDLCALC, TRIG, CHOLHDL, LDLDIRECT in the last 72 hours. Thyroid Function Tests: No results for input(s): TSH, T4TOTAL, FREET4, T3FREE, THYROIDAB in the last 72 hours. Anemia Panel: No results for input(s): VITAMINB12, FOLATE, FERRITIN, TIBC, IRON, RETICCTPCT in the last 72 hours. Urine analysis: No results found for: COLORURINE, APPEARANCEUR, LABSPEC, PHURINE, GLUCOSEU, HGBUR, BILIRUBINUR, KETONESUR, PROTEINUR, UROBILINOGEN, NITRITE, LEUKOCYTESUR  Radiological Exams on Admission: DG Chest 2 View  Result Date: 06/21/2020 CLINICAL DATA:  Peripheral edema and hypoxia EXAM: CHEST - 2 VIEW COMPARISON:  None. FINDINGS: 10/05/2019 IMPRESSION: No active cardiopulmonary disease. Electronically Signed   By: Donavan Foil M.D.   On: 06/21/2020 16:36   EKG: Independently reviewed.  Normal sinus rhythm, EKG mentions about presence of pericarditis although I do not appreciate pericarditis changes or ST elevation.. Echocardiogram: Ordered  I reviewed all nursing notes, pharmacy notes, vitals, pertinent old records.  Assessment/Plan 1. Acute diastolic (congestive) heart failure  (HCC) Suspected acute on chronic diastolic CHF. Also possibility of acute right heart failure. We will give IV Lasix 80 mg twice daily Monitor on telemetry. Monitor daily weight and ins and outs. Monitor BMP and magnesium.  2.  Cellulitis Chronic venous insufficiency Bilateral lower extremity redness and warmth.  Likely from chronic venous insufficiency Oral Keflex for cellulitis treatment Check ESR and CRP.  3.  Bilateral calf tenderness. Shortness of breath. Check lower extremity Doppler to rule out DVT Pradaxa  4.  Accelerated hypertension. Add Imdur and hydralazine per Also Coreg. Monitor on telemetry. Check echocardiogram. Likely cause of patient's CHF progression.  5.  Needs sleep apnea assessment. Obesity. Obesity hypoventilation syndrome. Orthopnea. Check nocturnal oximetry.  6.  Mild hypoxia not meeting criteria for acute hypoxemic respiratory failure. Continue to monitor for now.  Expecting improvement during daytime hypoxia with diuresis but patient will still suffer from nocturnal hypoxia.  7.  Leukocytosis. In the setting of cellulitis production on oral antibiotic   Nutrition: Cardiac diet DVT Prophylaxis: Subcutaneous Heparin   Advance goals of care discussion: Full code   Consults: none   Family Communication: no  family was present at bedside, at the time of interview.   Disposition:  Status is: Observation  The patient remains OBS appropriate and will d/c before 2 midnights.  Dispo: The patient is from: Home              Anticipated d/c is to: Home              Anticipated d/c date is: 2 days              Patient currently is not medically stable to d/c.       Author: Berle Mull, MD [facility] 06/21/2020 9:56 PM   To reach On-call, see care teams to locate the attending and reach out to them via [email or web address]. If 7PM-7AM, please contact night-coverage If you still have difficulty reaching the attending provider, please page  the [facility] (Director on Call) for [facility] on amion for assistance.

## 2020-06-22 ENCOUNTER — Observation Stay (HOSPITAL_COMMUNITY): Payer: Self-pay

## 2020-06-22 ENCOUNTER — Other Ambulatory Visit: Payer: Self-pay

## 2020-06-22 DIAGNOSIS — I160 Hypertensive urgency: Secondary | ICD-10-CM

## 2020-06-22 DIAGNOSIS — L03119 Cellulitis of unspecified part of limb: Secondary | ICD-10-CM

## 2020-06-22 DIAGNOSIS — R609 Edema, unspecified: Secondary | ICD-10-CM

## 2020-06-22 DIAGNOSIS — I872 Venous insufficiency (chronic) (peripheral): Secondary | ICD-10-CM

## 2020-06-22 DIAGNOSIS — I5031 Acute diastolic (congestive) heart failure: Secondary | ICD-10-CM

## 2020-06-22 LAB — CBC WITH DIFFERENTIAL/PLATELET
Abs Immature Granulocytes: 0.11 10*3/uL — ABNORMAL HIGH (ref 0.00–0.07)
Basophils Absolute: 0.1 10*3/uL (ref 0.0–0.1)
Basophils Relative: 1 %
Eosinophils Absolute: 0.4 10*3/uL (ref 0.0–0.5)
Eosinophils Relative: 3 %
HCT: 54.1 % — ABNORMAL HIGH (ref 39.0–52.0)
Hemoglobin: 16.1 g/dL (ref 13.0–17.0)
Immature Granulocytes: 1 %
Lymphocytes Relative: 16 %
Lymphs Abs: 2.3 10*3/uL (ref 0.7–4.0)
MCH: 29.2 pg (ref 26.0–34.0)
MCHC: 29.8 g/dL — ABNORMAL LOW (ref 30.0–36.0)
MCV: 98.2 fL (ref 80.0–100.0)
Monocytes Absolute: 1.2 10*3/uL — ABNORMAL HIGH (ref 0.1–1.0)
Monocytes Relative: 8 %
Neutro Abs: 10.6 10*3/uL — ABNORMAL HIGH (ref 1.7–7.7)
Neutrophils Relative %: 71 %
Platelets: 228 10*3/uL (ref 150–400)
RBC: 5.51 MIL/uL (ref 4.22–5.81)
RDW: 13.5 % (ref 11.5–15.5)
WBC: 14.7 10*3/uL — ABNORMAL HIGH (ref 4.0–10.5)
nRBC: 0 % (ref 0.0–0.2)

## 2020-06-22 LAB — ECHOCARDIOGRAM COMPLETE

## 2020-06-22 LAB — BASIC METABOLIC PANEL
Anion gap: 11 (ref 5–15)
BUN: 12 mg/dL (ref 6–20)
CO2: 35 mmol/L — ABNORMAL HIGH (ref 22–32)
Calcium: 8.8 mg/dL — ABNORMAL LOW (ref 8.9–10.3)
Chloride: 93 mmol/L — ABNORMAL LOW (ref 98–111)
Creatinine, Ser: 0.99 mg/dL (ref 0.61–1.24)
GFR calc Af Amer: >60 mL/min (ref >60)
GFR calc non Af Amer: >60 mL/min (ref >60)
Glucose, Bld: 116 mg/dL — ABNORMAL HIGH (ref 70–99)
Potassium: 4.5 mmol/L (ref 3.5–5.1)
Sodium: 139 mmol/L (ref 135–145)

## 2020-06-22 LAB — HIV ANTIBODY (ROUTINE TESTING W REFLEX): HIV Screen 4th Generation wRfx: NONREACTIVE

## 2020-06-22 LAB — MAGNESIUM: Magnesium: 2.3 mg/dL (ref 1.7–2.4)

## 2020-06-22 LAB — T4, FREE: Free T4: 0.87 ng/dL (ref 0.61–1.12)

## 2020-06-22 MED ORDER — ISOSORBIDE MONONITRATE ER 30 MG PO TB24
30.0000 mg | ORAL_TABLET | Freq: Every day | ORAL | Status: DC
  Administered 2020-06-22 – 2020-06-26 (×5): 30 mg via ORAL
  Filled 2020-06-22 (×5): qty 1

## 2020-06-22 MED ORDER — NICOTINE 21 MG/24HR TD PT24: 21.0000 mg | MEDICATED_PATCH | Freq: Every day | TRANSDERMAL | Status: DC

## 2020-06-22 MED ORDER — ENOXAPARIN SODIUM 40 MG/0.4ML ~~LOC~~ SOLN
40.0000 mg | SUBCUTANEOUS | Status: DC
  Filled 2020-06-22: qty 0.4

## 2020-06-22 MED ORDER — HYDRALAZINE HCL 25 MG PO TABS
25.0000 mg | ORAL_TABLET | Freq: Four times a day (QID) | ORAL | Status: DC
  Administered 2020-06-22 – 2020-06-26 (×19): 25 mg via ORAL
  Filled 2020-06-22 (×20): qty 1

## 2020-06-22 NOTE — Progress Notes (Signed)
PROGRESS NOTE  Joseph Morales AJO:[id number] DOB: [birth year] DOA: 06/21/2020 PCP: Patient, No Pcp Per  HPI/Recap of past 24 hours: HPI from Dr Dory Horn is a 49 y.o. male with past medical history of asthma and obesity, essential hypertension. Patient presented with complaints of progressively worsening SOB, ongoing for last 1 year worsening for last 1 week.  Patient denies having any chest pain, no nausea no, vomiting. Noted chronic leg swelling which is also getting worse for last 1 week. Denies having any PCP or taking any medication. Reports sleeping on couch due to difficulty lying flat for almost a year, also wakes up tired and sleeps easily during the day. Daily tobacco abuse, denies alcohol or substance abuse, except for occasional marijuana use. In the ED, found to have vol overload clinically, admitted for further management.   Today, patient still reported shortness of breath, with slight improvement, denies any chest pain, abdominal pain, nausea/vomiting, fever/chills.  Still with significant bilateral lower extremity swelling.    Assessment/Plan: Principal Problem:   Acute diastolic (congestive) heart failure (HCC) Active Problems:   Asthma   Hypoxia   Orthopnea   Chronic venous insufficiency   Cellulitis   Obesity, Class III, BMI 40-49.9 (morbid obesity) (HCC)   Obesity hypoventilation syndrome (HCC)   Leucocytosis   Chronic respiratory alkalosis   Hypertensive urgency   Needs sleep apnea assessment   Calf tenderness  Likely acute on chronic diastolic HF Noted S OB, bilateral lower extremity edema with some hypoxia on admission BNP 33.9, could be falsely low in obese patients Troponin negative, EKG with no acute ST changes, independently reviewed Chest x-ray unremarkable ECHO with poor window due to morbid obesity, EF 70 to 75%, no regional wall motion abnormality, left ventricular diastolic parameters are indeterminate, right ventricular size is moderately  enlarged Continue IV Lasix Strict I's and O's, daily weights  Acute hypoxic respiratory failure Likely 2/2 above Vs obesity hypoventilation syndrome Vs possible COPD (current smoker) Noted to drop saturation especially while sleeping Now requiring about 2 L of O2, saturating well Continue diuresis for now, inhalers Supplemental oxygen as needed  Possible cellulitis Chronic venous insufficiency Currently afebrile, with leukocytosis Noted bilateral lower extremity redness, warmth Bilateral DVT Doppler negative for any acute DVT Continue oral Keflex for now  Uncontrolled hypertension Not on any medication PTA Started on Imdur, hydralazine, Coreg Monitor closely  OSA/obesity hypoventilation syndrome Noted elevated CO2, with elevated hematocrit Outpatient sleep study recommended  Obesity Lifestyle modification advised  Tobacco abuse Smokes about half a pack a day Advised to quit Nicotine patch offered       Malnutrition Type:      Malnutrition Characteristics:      Nutrition Interventions:       There is no height or weight on file to calculate BMI.     Code Status: Full  Family Communication: Discussed extensively with patient  Disposition Plan: Status is: Inpatient  Remains inpatient appropriate because:IV treatments appropriate due to intensity of illness or inability to take PO   Dispo: The patient is from: Home              Anticipated d/c is to: Home              Anticipated d/c date is: 2 days              Patient currently is not medically stable to d/c.    Consultants:  None  Procedures:  None  Antimicrobials:  P.o. Keflex  DVT prophylaxis: Lovenox   Objective: Vitals:   06/22/20 1101 06/22/20 1144 06/22/20 1200 06/22/20 1247  BP: 135/65 136/67 (!) 151/78 (!) 148/82  Pulse: 97 100 92 91  Resp: (!) 21 16 (!) 30 (!) 25  Temp:      TempSrc:      SpO2: 95% 90% 90% 96%    Intake/Output Summary (Last 24 hours) at  06/22/2020 1312 Last data filed at 06/22/2020 1142 Gross per 24 hour  Intake --  Output 2850 ml  Net -2850 ml   There were no vitals filed for this visit.  Exam:  General: NAD, obese  Cardiovascular: S1, S2 present  Respiratory:  Diminished breath sounds bilaterally  Abdomen: Soft, nontender, nondistended, bowel sounds present  Musculoskeletal: 2+ bilateral pedal edema noted  Skin: Normal  Psychiatry: Normal mood   Data Reviewed: CBC: Recent Labs  Lab 06/21/20 1704 06/22/20 0600  WBC 15.0* 14.7*  NEUTROABS 10.5* 10.6*  HGB 16.5 16.1  HCT 55.0* 54.1*  MCV 99.5 98.2  PLT 218 228   Basic Metabolic Panel: Recent Labs  Lab 06/21/20 1704 06/21/20 2115 06/22/20 0600  NA 142  --  139  K 4.6  --  4.5  CL 97*  --  93*  CO2 35*  --  35*  GLUCOSE 75  --  116*  BUN 13  --  12  CREATININE 1.05  --  0.99  CALCIUM 9.0  --  8.8*  MG  --  2.0 2.3   GFR: CrCl cannot be calculated (Unknown ideal weight.). Liver Function Tests: Recent Labs  Lab 06/21/20 1704  AST 22  ALT 22  ALKPHOS 65  BILITOT 1.1  PROT 8.0  ALBUMIN 4.2   No results for input(s): LIPASE, AMYLASE in the last 168 hours. No results for input(s): AMMONIA in the last 168 hours. Coagulation Profile: No results for input(s): INR, PROTIME in the last 168 hours. Cardiac Enzymes: No results for input(s): CKTOTAL, CKMB, CKMBINDEX, TROPONINI in the last 168 hours. BNP (last 3 results) No results for input(s): PROBNP in the last 8760 hours. HbA1C: No results for input(s): HGBA1C in the last 72 hours. CBG: No results for input(s): GLUCAP in the last 168 hours. Lipid Profile: No results for input(s): CHOL, HDL, LDLCALC, TRIG, CHOLHDL, LDLDIRECT in the last 72 hours. Thyroid Function Tests: Recent Labs    06/21/20 2115  TSH 0.438  FREET4 0.87   Anemia Panel: No results for input(s): VITAMINB12, FOLATE, FERRITIN, TIBC, IRON, RETICCTPCT in the last 72 hours. Urine analysis: No results found for:  COLORURINE, APPEARANCEUR, LABSPEC, PHURINE, GLUCOSEU, HGBUR, BILIRUBINUR, KETONESUR, PROTEINUR, UROBILINOGEN, NITRITE, LEUKOCYTESUR Sepsis Labs: @LABRCNTIP (procalcitonin:4,lacticidven:4)  ) Recent Results (from the past 240 hour(s))  SARS Coronavirus 2 by RT PCR (hospital order, performed in [facility] hospital lab) Nasopharyngeal Nasopharyngeal Swab     Status: None   Collection Time: 06/21/20  5:04 PM   Specimen: Nasopharyngeal Swab  Result Value Ref Range Status   SARS Coronavirus 2 NEGATIVE NEGATIVE Final    Comment: (NOTE) SARS-CoV-2 target nucleic acids are NOT DETECTED.  The SARS-CoV-2 RNA is generally detectable in upper and lower respiratory specimens during the acute phase of infection. The lowest concentration of SARS-CoV-2 viral copies this assay can detect is 250 copies / mL. A negative result does not preclude SARS-CoV-2 infection and should not be used as the sole basis for treatment or other patient management decisions.  A negative result may occur with improper specimen collection / handling, submission of specimen  other than nasopharyngeal swab, presence of viral mutation(s) within the areas targeted by this assay, and inadequate number of viral copies (<250 copies / mL). A negative result must be combined with clinical observations, patient history, and epidemiological information.  Fact Sheet for Patients:   StrictlyIdeas.no  Fact Sheet for Healthcare Providers: BankingDealers.co.za  This test is not yet approved or  cleared by the [place] FDA and has been authorized for detection and/or diagnosis of SARS-CoV-2 by FDA under an Emergency Use Authorization (EUA).  This EUA will remain in effect (meaning this test can be used) for the duration of the COVID-19 declaration under Section 564(b)(1) of the Act, 21 U.S.C. section 360bbb-3(b)(1), unless the authorization is terminated or revoked sooner.  Performed  at [facility], [place] [address], [place], [place] [address]       Studies: DG Chest 2 View  Result Date: 06/21/2020 CLINICAL DATA:  Peripheral edema and hypoxia EXAM: CHEST - 2 VIEW COMPARISON:  None. FINDINGS: 10/05/2019 IMPRESSION: No active cardiopulmonary disease. Electronically Signed   By: Donavan Foil M.D.   On: 06/21/2020 16:36   ECHOCARDIOGRAM COMPLETE  Result Date: 06/22/2020    ECHOCARDIOGRAM REPORT   Patient Name:   SHAYLON ADEN Date of Exam: 06/22/2020 Medical Rec #:  [id number]   Height:       75.0 in Accession #:    [id number]  Weight:       340.0 lb Date of Birth:  [birth year]   BSA:          2.750 m Patient Age:    39 years    BP:           123/71 mmHg Patient Gender: M           HR:           92 bpm. Exam Location:  Inpatient Procedure: 2D Echo, Cardiac Doppler and Color Doppler Indications:    I50.30* Unspecified diastolic (congestive) heart failure  History:        Patient has no prior history of Echocardiogram examinations.                 CHF, Abnormal ECG; Risk Factors:Hypertension and Current Smoker.                 Marijuana use.  Sonographer:    Roseanna Rainbow RDCS Referring Phys: [id number] [facility] M PATEL  Sonographer Comments: Technically difficult study due to poor echo windows, patient is morbidly obese, suboptimal parasternal window, suboptimal apical window and suboptimal subcostal window. Image acquisition challenging due to patient body habitus. IMPRESSIONS  1. Left ventricular ejection fraction, by estimation, is 70 to 75%. The left ventricle has hyperdynamic function. The left ventricle has no regional wall motion abnormalities. There is mild left ventricular hypertrophy. Left ventricular diastolic parameters are indeterminate.  2. Leftward septal shift with respiratory variation, could suggest primary lung pathology vs constrictive physiology.  3. Right ventricular systolic function is mildly reduced. The right ventricular size is moderately enlarged.  Tricuspid regurgitation signal is inadequate for assessing PA pressure.  4. The mitral valve is grossly normal. No evidence of mitral valve regurgitation.  5. The aortic valve was not well visualized. Aortic valve regurgitation is not visualized.  6. The inferior vena cava is dilated in size with <50% respiratory variability, suggesting right atrial pressure of 15 mmHg. FINDINGS  Left Ventricle: Left ventricular ejection fraction, by estimation, is 70 to 75%. The left ventricle has hyperdynamic function. The left ventricle has  no regional wall motion abnormalities. The left ventricular internal cavity size was normal in size. There is mild left ventricular hypertrophy. Left ventricular diastolic parameters are indeterminate. Right Ventricle: The right ventricular size is moderately enlarged. Right vetricular wall thickness was not assessed. Right ventricular systolic function is mildly reduced. Tricuspid regurgitation signal is inadequate for assessing PA pressure. Left Atrium: Left atrial size was not well visualized. Right Atrium: Right atrial size was not well visualized. Pericardium: There is no evidence of pericardial effusion. Mitral Valve: The mitral valve is grossly normal. No evidence of mitral valve regurgitation. Tricuspid Valve: The tricuspid valve is not well visualized. Tricuspid valve regurgitation is not demonstrated. No evidence of tricuspid stenosis. Aortic Valve: The aortic valve was not well visualized. Aortic valve regurgitation is not visualized. Pulmonic Valve: The pulmonic valve was not well visualized. Pulmonic valve regurgitation is not visualized. No evidence of pulmonic stenosis. Aorta: The aortic root was not well visualized and the ascending aorta was not well visualized. Venous: The inferior vena cava is dilated in size with less than 50% respiratory variability, suggesting right atrial pressure of 15 mmHg. IAS/Shunts: The interatrial septum was not well visualized.  LEFT VENTRICLE  PLAX 2D LVIDd:         3.68 cm     Diastology LVIDs:         3.04 cm     LV e' lateral:   9.79 cm/s LV PW:         1.27 cm     LV E/e' lateral: 6.8 LV IVS:        1.32 cm     LV e' medial:    7.18 cm/s LVOT diam:     2.30 cm     LV E/e' medial:  9.3 LV SV:         66 LV SV Index:   24 LVOT Area:     4.15 cm  LV Volumes (MOD) LV vol d, MOD A2C: 45.0 ml LV vol d, MOD A4C: 79.8 ml LV vol s, MOD A2C: 19.4 ml LV vol s, MOD A4C: 24.1 ml LV SV MOD A2C:     25.6 ml LV SV MOD A4C:     79.8 ml LV SV MOD BP:      39.4 ml RIGHT VENTRICLE             IVC RV S prime:     11.70 cm/s  IVC diam: 2.29 cm TAPSE (M-mode): 1.8 cm LEFT ATRIUM             Index      RIGHT ATRIUM           Index LA diam:        2.90 cm 1.05 cm/m RA Area:     11.90 cm LA Vol (A2C):   16.9 ml 6.15 ml/m RA Volume:   28.80 ml  10.47 ml/m LA Vol (A4C):   19.8 ml 7.20 ml/m LA Biplane Vol: 18.3 ml 6.66 ml/m  AORTIC VALVE LVOT Vmax:   81.30 cm/s LVOT Vmean:  58.600 cm/s LVOT VTI:    0.159 m  AORTA Ao Root diam: 3.00 cm Ao Asc diam:  3.00 cm MITRAL VALVE MV Area (PHT): 2.91 cm    SHUNTS MV Decel Time: 261 msec    Systemic VTI:  0.16 m MV E velocity: 66.80 cm/s  Systemic Diam: 2.30 cm MV A velocity: 57.60 cm/s MV E/A ratio:  1.16 Weston Brass MD Electronically signed by Weston Brass MD  Signature Date/Time: 06/22/2020/11:17:46 AM    Final    VAS [place] LOWER EXTREMITY VENOUS (DVT)  Result Date: 06/22/2020  Lower Venous DVTStudy Indications: Pain, and Swelling.  Risk Factors: None identified. Limitations: Body habitus, poor ultrasound/tissue interface and patient pain tolerance. Comparison Study: No prior studies. Performing Technologist: Chanda Busing RVT  Examination Guidelines: A complete evaluation includes B-mode imaging, spectral Doppler, color Doppler, and power Doppler as needed of all accessible portions of each vessel. Bilateral testing is considered an integral part of a complete examination. Limited examinations for reoccurring indications  may be performed as noted. The reflux portion of the exam is performed with the patient in reverse Trendelenburg.  +---------+---------------+---------+-----------+----------+-------------------+ RIGHT    CompressibilityPhasicitySpontaneityPropertiesThrombus Aging      +---------+---------------+---------+-----------+----------+-------------------+ CFV      Full           Yes      Yes                                      +---------+---------------+---------+-----------+----------+-------------------+ SFJ      Full                                                             +---------+---------------+---------+-----------+----------+-------------------+ FV Prox  Full                                                             +---------+---------------+---------+-----------+----------+-------------------+ FV Mid   Full                                                             +---------+---------------+---------+-----------+----------+-------------------+ FV Distal               Yes      Yes                                      +---------+---------------+---------+-----------+----------+-------------------+ PFV      Full                                                             +---------+---------------+---------+-----------+----------+-------------------+ POP      Full           Yes      Yes                                      +---------+---------------+---------+-----------+----------+-------------------+ PTV  Patency shown with                                                        color doppler       +---------+---------------+---------+-----------+----------+-------------------+ PERO                                                  Not visualized      +---------+---------------+---------+-----------+----------+-------------------+    +---------+---------------+---------+-----------+----------+--------------+ LEFT     CompressibilityPhasicitySpontaneityPropertiesThrombus Aging +---------+---------------+---------+-----------+----------+--------------+ CFV      Full           Yes      Yes                                 +---------+---------------+---------+-----------+----------+--------------+ SFJ      Full                                                        +---------+---------------+---------+-----------+----------+--------------+ FV Prox  Full                                                        +---------+---------------+---------+-----------+----------+--------------+ FV Mid   Full                                                        +---------+---------------+---------+-----------+----------+--------------+ FV Distal               Yes      Yes                                 +---------+---------------+---------+-----------+----------+--------------+ PFV      Full                                                        +---------+---------------+---------+-----------+----------+--------------+ POP      Full           Yes      Yes                                 +---------+---------------+---------+-----------+----------+--------------+ PTV      Full                                                        +---------+---------------+---------+-----------+----------+--------------+  PERO     Full                                                        +---------+---------------+---------+-----------+----------+--------------+     Summary: RIGHT: - There is no evidence of deep vein thrombosis in the lower extremity. However, portions of this examination were limited- see technologist comments above.  - No cystic structure found in the popliteal fossa.  LEFT: - There is no evidence of deep vein thrombosis in the lower extremity. However, portions of this examination were  limited- see technologist comments above.  - No cystic structure found in the popliteal fossa.  *See table(s) above for measurements and observations.    Preliminary     Scheduled Meds: . aspirin EC  81 mg Oral Daily  . carvedilol  3.125 mg Oral BID WC  . cephALEXin  500 mg Oral Q6H  . enoxaparin (LOVENOX) injection  40 mg Subcutaneous Q24H  . furosemide  80 mg Intravenous BID  . hydrALAZINE  25 mg Oral Q6H  . isosorbide mononitrate  30 mg Oral Daily  . losartan  50 mg Oral Daily  . mometasone-formoterol  2 puff Inhalation BID  . sodium chloride flush  3 mL Intravenous Q12H    Continuous Infusions: . sodium chloride       LOS: 0 days     Briant Cedar, MD [facility]  If 7PM-7AM, please contact night-coverage [email or web address] 06/22/2020, 1:12 PM

## 2020-06-22 NOTE — Progress Notes (Signed)
Bilateral lower extremity venous duplex has been completed. Preliminary results can be found in CV Proc through chart review.   06/22/20 8:48 AM Olen Cordial RVT

## 2020-06-22 NOTE — Progress Notes (Signed)
Overnight Pulse ox Study complete, Report to follow.  RT to monitor and assess as needed.

## 2020-06-22 NOTE — Progress Notes (Signed)
  Echocardiogram 2D Echocardiogram has been performed.  Janalyn Harder 06/22/2020, 10:07 AM

## 2020-06-22 NOTE — Progress Notes (Addendum)
Pt started on overnight pulse oximetry study.  Pt wearing 2Lpm nasal cannula.

## 2020-06-22 NOTE — ED Notes (Signed)
Pt. Documented in error see above note in chart. 

## 2020-06-22 NOTE — ED Notes (Signed)
Pt sleeping O2 level on room air dropped to 85%. Pt placed on 2L O2 via nasal canula. O2 came up to 93-95%.

## 2020-06-22 NOTE — Plan of Care (Signed)
  Problem: Education: Goal: Knowledge of General Education information will improve Description Including pain rating scale, medication(s)/side effects and non-pharmacologic comfort measures Outcome: Progressing   Problem: Health Behavior/Discharge Planning: Goal: Ability to manage health-related needs will improve Outcome: Progressing   

## 2020-06-23 LAB — CBC WITH DIFFERENTIAL/PLATELET
Abs Immature Granulocytes: 0.1 10*3/uL — ABNORMAL HIGH (ref 0.00–0.07)
Basophils Absolute: 0.1 10*3/uL (ref 0.0–0.1)
Basophils Relative: 1 %
Eosinophils Absolute: 0.4 10*3/uL (ref 0.0–0.5)
Eosinophils Relative: 3 %
HCT: 54.7 % — ABNORMAL HIGH (ref 39.0–52.0)
Hemoglobin: 16.3 g/dL (ref 13.0–17.0)
Immature Granulocytes: 1 %
Lymphocytes Relative: 17 %
Lymphs Abs: 2.3 10*3/uL (ref 0.7–4.0)
MCH: 29.2 pg (ref 26.0–34.0)
MCHC: 29.8 g/dL — ABNORMAL LOW (ref 30.0–36.0)
MCV: 97.9 fL (ref 80.0–100.0)
Monocytes Absolute: 1.1 10*3/uL — ABNORMAL HIGH (ref 0.1–1.0)
Monocytes Relative: 8 %
Neutro Abs: 10 10*3/uL — ABNORMAL HIGH (ref 1.7–7.7)
Neutrophils Relative %: 70 %
Platelets: 212 10*3/uL (ref 150–400)
RBC: 5.59 MIL/uL (ref 4.22–5.81)
RDW: 13.4 % (ref 11.5–15.5)
WBC: 14 10*3/uL — ABNORMAL HIGH (ref 4.0–10.5)
nRBC: 0 % (ref 0.0–0.2)

## 2020-06-23 LAB — BASIC METABOLIC PANEL
Anion gap: 9 (ref 5–15)
BUN: 17 mg/dL (ref 6–20)
CO2: 37 mmol/L — ABNORMAL HIGH (ref 22–32)
Calcium: 8.4 mg/dL — ABNORMAL LOW (ref 8.9–10.3)
Chloride: 89 mmol/L — ABNORMAL LOW (ref 98–111)
Creatinine, Ser: 1 mg/dL (ref 0.61–1.24)
GFR calc Af Amer: >60 mL/min (ref >60)
GFR calc non Af Amer: >60 mL/min (ref >60)
Glucose, Bld: 123 mg/dL — ABNORMAL HIGH (ref 70–99)
Potassium: 3.9 mmol/L (ref 3.5–5.1)
Sodium: 135 mmol/L (ref 135–145)

## 2020-06-23 LAB — LIPID PANEL
Cholesterol: 199 mg/dL (ref 0–200)
HDL: 35 mg/dL — ABNORMAL LOW (ref >40)
LDL Cholesterol: 137 mg/dL — ABNORMAL HIGH (ref 0–99)
Total CHOL/HDL Ratio: 5.7 RATIO
Triglycerides: 137 mg/dL (ref <150)
VLDL: 27 mg/dL (ref 0–40)

## 2020-06-23 LAB — HEMOGLOBIN A1C
Hgb A1c MFr Bld: 5.9 % — ABNORMAL HIGH (ref 4.8–5.6)
Mean Plasma Glucose: 122.63 mg/dL

## 2020-06-23 MED ORDER — ATORVASTATIN CALCIUM 40 MG PO TABS
40.0000 mg | ORAL_TABLET | Freq: Every day | ORAL | Status: DC
  Administered 2020-06-23 – 2020-06-26 (×4): 40 mg via ORAL
  Filled 2020-06-23 (×4): qty 1

## 2020-06-23 MED ORDER — ENOXAPARIN SODIUM 80 MG/0.8ML ~~LOC~~ SOLN
80.0000 mg | SUBCUTANEOUS | Status: DC
  Filled 2020-06-23 (×2): qty 0.8

## 2020-06-23 NOTE — Progress Notes (Signed)
PROGRESS NOTE  Joseph Morales HDQ:[id number] DOB: 06-16-1971 DOA: 06/21/2020 PCP: Patient, No Pcp Per  HPI/Recap of past 24 hours: HPI from Dr Dory Horn is a 49 y.o. male with past medical history of asthma and obesity, essential hypertension. Patient presented with complaints of progressively worsening SOB, ongoing for last 1 year worsening for last 1 week.  Patient denies having any chest pain, no nausea no, vomiting. Noted chronic leg swelling which is also getting worse for last 1 week. Denies having any PCP or taking any medication. Reports sleeping on couch due to difficulty lying flat for almost a year, also wakes up tired and sleeps easily during the day. Daily tobacco abuse, denies alcohol or substance abuse, except for occasional marijuana use. In the ED, found to have vol overload clinically, admitted for further management.    Today, patient denies any new complaints, still short of breath, denies it worsening.  Bilateral lower extremities still with significant edema.  Denies any chest pain, abdominal pain, nausea/vomiting, fever/chills.    Assessment/Plan: Principal Problem:   Acute diastolic (congestive) heart failure (HCC) Active Problems:   Asthma   Hypoxia   Orthopnea   Chronic venous insufficiency   Cellulitis   Obesity, Class III, BMI 40-49.9 (morbid obesity) (HCC)   Obesity hypoventilation syndrome (HCC)   Leucocytosis   Chronic respiratory alkalosis   Hypertensive urgency   Needs sleep apnea assessment   Calf tenderness  Likely acute on chronic diastolic HF Noted S OB, bilateral lower extremity edema with some hypoxia on admission BNP 33.9, could be falsely low in obese patients Troponin negative, EKG with no acute ST changes, independently reviewed Chest x-ray unremarkable ECHO with poor window due to morbid obesity, EF 70 to 75%, no regional wall motion abnormality, left ventricular diastolic parameters are indeterminate, right ventricular size is  moderately enlarged Continue IV Lasix Strict I's and O's, daily weights  Acute hypoxic respiratory failure Likely 2/2 above Vs obesity hypoventilation syndrome Vs possible COPD (current smoker) Noted to drop saturation especially while sleeping Continue diuresis for now, inhalers Supplemental oxygen as needed  Possible cellulitis Chronic venous insufficiency Currently afebrile, with leukocytosis Procalcitonin pending Noted bilateral lower extremity redness, warmth Bilateral DVT Doppler negative for any acute DVT Continue oral Keflex for now  Uncontrolled hypertension Not on any medication PTA Started on Imdur, hydralazine, Coreg, cozaar Monitor closely  Hyperlipidemia Start Lipitor Follow-up with PCP  Prediabetes A1c 5.9 Follow-up with PCP  OSA/obesity hypoventilation syndrome Noted elevated CO2, with elevated hematocrit Outpatient sleep study recommended  Morbid obesity Lifestyle modification advised  Tobacco abuse Smokes about half a pack a day Advised to quit Nicotine patch offered       Malnutrition Type:      Malnutrition Characteristics:      Nutrition Interventions:       Estimated body mass index is 45.61 kg/m as calculated from the following:   Height as of this encounter: 6\' 3"  (1.905 m).   Weight as of this encounter: 165.5 kg.     Code Status: Full  Family Communication: Discussed extensively with patient  Disposition Plan: Status is: Inpatient  Remains inpatient appropriate because:IV treatments appropriate due to intensity of illness or inability to take PO   Dispo: The patient is from: Home              Anticipated d/c is to: Home              Anticipated d/c date is: 2 days  Patient currently is not medically stable to d/c.    Consultants:  None  Procedures:  None  Antimicrobials:  P.o. Keflex  DVT prophylaxis: Lovenox   Objective: Vitals:   06/23/20 0500 06/23/20 0555 06/23/20 1039  06/23/20 1522  BP:  (!) 147/54  (!) 162/69  Pulse:  93  78  Resp:  20  18  Temp:  98.6 F (37 C)  98 F (36.7 C)  TempSrc:  Oral    SpO2:  94% 90% 95%  Weight: (!) 165.5 kg     Height:        Intake/Output Summary (Last 24 hours) at 06/23/2020 1632 Last data filed at 06/23/2020 1241 Gross per 24 hour  Intake 0 ml  Output 4300 ml  Net -4300 ml   Filed Weights   06/22/20 1551 06/23/20 0500  Weight: (!) 167.6 kg (!) 165.5 kg    Exam:  General: NAD, morbidly obese  Cardiovascular: S1, S2 present  Respiratory:  Diminished breath sounds bilaterally, with bibasilar crackles  Abdomen: Soft, nontender, nondistended, bowel sounds present  Musculoskeletal: 2+ bilateral pedal edema, erythema BLE  Skin:  As above  Psychiatry: Normal mood   Data Reviewed: CBC: Recent Labs  Lab 06/21/20 1704 06/22/20 0600 06/23/20 0502  WBC 15.0* 14.7* 14.0*  NEUTROABS 10.5* 10.6* 10.0*  HGB 16.5 16.1 16.3  HCT 55.0* 54.1* 54.7*  MCV 99.5 98.2 97.9  PLT 218 228 623   Basic Metabolic Panel: Recent Labs  Lab 06/21/20 1704 06/21/20 2115 06/22/20 0600 06/23/20 0502  NA 142  --  139 135  K 4.6  --  4.5 3.9  CL 97*  --  93* 89*  CO2 35*  --  35* 37*  GLUCOSE 75  --  116* 123*  BUN 13  --  12 17  CREATININE 1.05  --  0.99 1.00  CALCIUM 9.0  --  8.8* 8.4*  MG  --  2.0 2.3  --    GFR: Estimated Creatinine Clearance: 152.6 mL/min (by C-G formula based on SCr of 1 mg/dL). Liver Function Tests: Recent Labs  Lab 06/21/20 1704  AST 22  ALT 22  ALKPHOS 65  BILITOT 1.1  PROT 8.0  ALBUMIN 4.2   No results for input(s): LIPASE, AMYLASE in the last 168 hours. No results for input(s): AMMONIA in the last 168 hours. Coagulation Profile: No results for input(s): INR, PROTIME in the last 168 hours. Cardiac Enzymes: No results for input(s): CKTOTAL, CKMB, CKMBINDEX, TROPONINI in the last 168 hours. BNP (last 3 results) No results for input(s): PROBNP in the last 8760  hours. HbA1C: Recent Labs    06/23/20 0502  HGBA1C 5.9*   CBG: No results for input(s): GLUCAP in the last 168 hours. Lipid Profile: Recent Labs    06/23/20 0502  CHOL 199  HDL 35*  LDLCALC 137*  TRIG 137  CHOLHDL 5.7   Thyroid Function Tests: Recent Labs    06/21/20 2115  TSH 0.438  FREET4 0.87   Anemia Panel: No results for input(s): VITAMINB12, FOLATE, FERRITIN, TIBC, IRON, RETICCTPCT in the last 72 hours. Urine analysis: No results found for: COLORURINE, APPEARANCEUR, LABSPEC, PHURINE, GLUCOSEU, HGBUR, BILIRUBINUR, KETONESUR, PROTEINUR, UROBILINOGEN, NITRITE, LEUKOCYTESUR Sepsis Labs: @LABRCNTIP (procalcitonin:4,lacticidven:4)  ) Recent Results (from the past 240 hour(s))  SARS Coronavirus 2 by RT PCR (hospital order, performed in [facility] hospital lab) Nasopharyngeal Nasopharyngeal Swab     Status: None   Collection Time: 06/21/20  5:04 PM   Specimen: Nasopharyngeal Swab  Result  Value Ref Range Status   SARS Coronavirus 2 NEGATIVE NEGATIVE Final    Comment: (NOTE) SARS-CoV-2 target nucleic acids are NOT DETECTED.  The SARS-CoV-2 RNA is generally detectable in upper and lower respiratory specimens during the acute phase of infection. The lowest concentration of SARS-CoV-2 viral copies this assay can detect is 250 copies / mL. A negative result does not preclude SARS-CoV-2 infection and should not be used as the sole basis for treatment or other patient management decisions.  A negative result may occur with improper specimen collection / handling, submission of specimen other than nasopharyngeal swab, presence of viral mutation(s) within the areas targeted by this assay, and inadequate number of viral copies (<250 copies / mL). A negative result must be combined with clinical observations, patient history, and epidemiological information.  Fact Sheet for Patients:   BoilerBrush.com.cy  Fact Sheet for Healthcare  Providers: https://pope.com/  This test is not yet approved or  cleared by the [place] FDA and has been authorized for detection and/or diagnosis of SARS-CoV-2 by FDA under an Emergency Use Authorization (EUA).  This EUA will remain in effect (meaning this test can be used) for the duration of the COVID-19 declaration under Section 564(b)(1) of the Act, 21 U.S.C. section 360bbb-3(b)(1), unless the authorization is terminated or revoked sooner.  Performed at [facility], [address], [place], [place] [address]       Studies: No results found.  Scheduled Meds: . aspirin EC  81 mg Oral Daily  . atorvastatin  40 mg Oral Daily  . carvedilol  3.125 mg Oral BID WC  . cephALEXin  500 mg Oral Q6H  . enoxaparin (LOVENOX) injection  80 mg Subcutaneous Q24H  . furosemide  80 mg Intravenous BID  . hydrALAZINE  25 mg Oral Q6H  . isosorbide mononitrate  30 mg Oral Daily  . losartan  50 mg Oral Daily  . mometasone-formoterol  2 puff Inhalation BID  . nicotine  21 mg Transdermal Daily  . sodium chloride flush  3 mL Intravenous Q12H    Continuous Infusions: . sodium chloride       LOS: 1 day     Briant Cedar, MD [facility]  If 7PM-7AM, please contact night-coverage [email or web address] 06/23/2020, 4:32 PM

## 2020-06-24 LAB — CBC WITH DIFFERENTIAL/PLATELET
Abs Immature Granulocytes: 0.07 10*3/uL (ref 0.00–0.07)
Basophils Absolute: 0.1 10*3/uL (ref 0.0–0.1)
Basophils Relative: 1 %
Eosinophils Absolute: 0.4 10*3/uL (ref 0.0–0.5)
Eosinophils Relative: 3 %
HCT: 52.9 % — ABNORMAL HIGH (ref 39.0–52.0)
Hemoglobin: 16 g/dL (ref 13.0–17.0)
Immature Granulocytes: 1 %
Lymphocytes Relative: 16 %
Lymphs Abs: 2.2 10*3/uL (ref 0.7–4.0)
MCH: 29.3 pg (ref 26.0–34.0)
MCHC: 30.2 g/dL (ref 30.0–36.0)
MCV: 96.9 fL (ref 80.0–100.0)
Monocytes Absolute: 1.2 10*3/uL — ABNORMAL HIGH (ref 0.1–1.0)
Monocytes Relative: 9 %
Neutro Abs: 9.7 10*3/uL — ABNORMAL HIGH (ref 1.7–7.7)
Neutrophils Relative %: 70 %
Platelets: 191 10*3/uL (ref 150–400)
RBC: 5.46 MIL/uL (ref 4.22–5.81)
RDW: 13.4 % (ref 11.5–15.5)
WBC: 13.6 10*3/uL — ABNORMAL HIGH (ref 4.0–10.5)
nRBC: 0 % (ref 0.0–0.2)

## 2020-06-24 LAB — BASIC METABOLIC PANEL
Anion gap: 10 (ref 5–15)
BUN: 22 mg/dL — ABNORMAL HIGH (ref 6–20)
CO2: 37 mmol/L — ABNORMAL HIGH (ref 22–32)
Calcium: 8.4 mg/dL — ABNORMAL LOW (ref 8.9–10.3)
Chloride: 92 mmol/L — ABNORMAL LOW (ref 98–111)
Creatinine, Ser: 0.95 mg/dL (ref 0.61–1.24)
GFR calc Af Amer: >60 mL/min (ref >60)
GFR calc non Af Amer: >60 mL/min (ref >60)
Glucose, Bld: 118 mg/dL — ABNORMAL HIGH (ref 70–99)
Potassium: 4.2 mmol/L (ref 3.5–5.1)
Sodium: 139 mmol/L (ref 135–145)

## 2020-06-24 LAB — PROCALCITONIN: Procalcitonin: <0.1 ng/mL

## 2020-06-24 NOTE — Progress Notes (Signed)
PROGRESS NOTE  Ilai Hiller [id number]:[id number] DOB: May 26, 1971 DOA: 06/21/2020 PCP: Patient, No Pcp Per  HPI/Recap of past 24 hours: HPI from Dr Trilby Drummer is a 49 y.o. male with past medical history of asthma and obesity, essential hypertension. Patient presented with complaints of progressively worsening SOB, ongoing for last 1 year worsening for last 1 week.  Patient denies having any chest pain, no nausea no, vomiting. Noted chronic leg swelling which is also getting worse for last 1 week. Denies having any PCP or taking any medication. Reports sleeping on couch due to difficulty lying flat for almost a year, also wakes up tired and sleeps easily during the day. Daily tobacco abuse, denies alcohol or substance abuse, except for occasional marijuana use. In the ED, found to have vol overload clinically, admitted for further management.    Today, patient reports shortness of breath is improving, denies any chest pain, abdominal pain, nausea/vomiting, fever/chills.  Patient still requiring more IV diuresis    Assessment/Plan: Principal Problem:   Acute diastolic (congestive) heart failure (HCC) Active Problems:   Asthma   Hypoxia   Orthopnea   Chronic venous insufficiency   Cellulitis   Obesity, Class III, BMI 40-49.9 (morbid obesity) (HCC)   Obesity hypoventilation syndrome (HCC)   Leucocytosis   Chronic respiratory alkalosis   Hypertensive urgency   Needs sleep apnea assessment   Calf tenderness   Likely acute on chronic diastolic HF Noted SOB, bilateral lower extremity edema with some hypoxia on admission BNP 33.9, could be falsely low in obese patients Troponin negative, EKG with no acute ST changes, independently reviewed Chest x-ray unremarkable ECHO with poor window due to morbid obesity, EF 70 to 75%, no regional wall motion abnormality, left ventricular diastolic parameters are indeterminate, right ventricular size is moderately enlarged Continue IV Lasix Strict  I's and O's, daily weights  Acute hypoxic respiratory failure Likely 2/2 above Vs obesity hypoventilation syndrome Vs possible COPD (current smoker) Noted to drop saturation especially while sleeping Continue diuresis for now, inhalers Supplemental oxygen as needed  Possible cellulitis Chronic venous insufficiency Currently afebrile, with leukocytosis Procalcitonin negative Noted bilateral lower extremity redness, warmth Bilateral DVT Doppler negative for any acute DVT Continue oral Keflex for now  Hypertension Not on any medication PTA Started on Imdur, hydralazine, Coreg, cozaar Monitor closely  Hyperlipidemia Start Lipitor Follow-up with PCP  Prediabetes A1c 5.9 Follow-up with PCP  OSA/obesity hypoventilation syndrome Noted elevated CO2, with elevated hematocrit Outpatient sleep study recommended  Morbid obesity Lifestyle modification advised  Tobacco abuse Smokes about half a pack a day Advised to quit Nicotine patch offered       Malnutrition Type:      Malnutrition Characteristics:      Nutrition Interventions:       Estimated body mass index is 45.42 kg/m as calculated from the following:   Height as of this encounter: 6\' 3"  (1.905 m).   Weight as of this encounter: 164.8 kg.     Code Status: Full  Family Communication: Discussed extensively with patient  Disposition Plan: Status is: Inpatient  Remains inpatient appropriate because:IV treatments appropriate due to intensity of illness or inability to take PO   Dispo: The patient is from: Home              Anticipated d/c is to: Home              Anticipated d/c date is: 2 days  Patient currently is not medically stable to d/c.    Consultants:  None  Procedures:  None  Antimicrobials:  P.o. Keflex  DVT prophylaxis: Lovenox   Objective: Vitals:   06/24/20 0827 06/24/20 1119 06/24/20 1300 06/24/20 1354  BP: (!) 160/82   (!) 124/46  Pulse: 91   91    Resp:    18  Temp:    97.7 F (36.5 C)  TempSrc:    Oral  SpO2:  90%  95%  Weight:   (!) 164.8 kg   Height:   6\' 3"  (1.905 m)     Intake/Output Summary (Last 24 hours) at 06/24/2020 1450 Last data filed at 06/24/2020 0926 Gross per 24 hour  Intake 239 ml  Output 2000 ml  Net -1761 ml   Filed Weights   06/23/20 0500 06/24/20 0530 06/24/20 1300  Weight: (!) 165.5 kg (!) 167.8 kg (!) 164.8 kg    Exam:  General: NAD, morbidly obese  Cardiovascular: S1, S2 present  Respiratory:  Diminished breath sounds bilaterally, with bibasilar crackles  Abdomen: Soft, nontender, nondistended, bowel sounds present  Musculoskeletal: 2+ bilateral pedal edema, erythema BLE  Skin:  As above, bilateral lower extremity chronic venous changes  Psychiatry: Normal mood   Data Reviewed: CBC: Recent Labs  Lab 06/21/20 1704 06/22/20 0600 06/23/20 0502 06/24/20 0528  WBC 15.0* 14.7* 14.0* 13.6*  NEUTROABS 10.5* 10.6* 10.0* 9.7*  HGB 16.5 16.1 16.3 16.0  HCT 55.0* 54.1* 54.7* 52.9*  MCV 99.5 98.2 97.9 96.9  PLT 218 228 212 191   Basic Metabolic Panel: Recent Labs  Lab 06/21/20 1704 06/21/20 2115 06/22/20 0600 06/23/20 0502 06/24/20 0528  NA 142  --  139 135 139  K 4.6  --  4.5 3.9 4.2  CL 97*  --  93* 89* 92*  CO2 35*  --  35* 37* 37*  GLUCOSE 75  --  116* 123* 118*  BUN 13  --  12 17 22*  CREATININE 1.05  --  0.99 1.00 0.95  CALCIUM 9.0  --  8.8* 8.4* 8.4*  MG  --  2.0 2.3  --   --    GFR: Estimated Creatinine Clearance: 160.2 mL/min (by C-G formula based on SCr of 0.95 mg/dL). Liver Function Tests: Recent Labs  Lab 06/21/20 1704  AST 22  ALT 22  ALKPHOS 65  BILITOT 1.1  PROT 8.0  ALBUMIN 4.2   No results for input(s): LIPASE, AMYLASE in the last 168 hours. No results for input(s): AMMONIA in the last 168 hours. Coagulation Profile: No results for input(s): INR, PROTIME in the last 168 hours. Cardiac Enzymes: No results for input(s): CKTOTAL, CKMB, CKMBINDEX,  TROPONINI in the last 168 hours. BNP (last 3 results) No results for input(s): PROBNP in the last 8760 hours. HbA1C: Recent Labs    06/23/20 0502  HGBA1C 5.9*   CBG: No results for input(s): GLUCAP in the last 168 hours. Lipid Profile: Recent Labs    06/23/20 0502  CHOL 199  HDL 35*  LDLCALC 137*  TRIG 137  CHOLHDL 5.7   Thyroid Function Tests: Recent Labs    06/21/20 2115  TSH 0.438  FREET4 0.87   Anemia Panel: No results for input(s): VITAMINB12, FOLATE, FERRITIN, TIBC, IRON, RETICCTPCT in the last 72 hours. Urine analysis: No results found for: COLORURINE, APPEARANCEUR, LABSPEC, PHURINE, GLUCOSEU, HGBUR, BILIRUBINUR, KETONESUR, PROTEINUR, UROBILINOGEN, NITRITE, LEUKOCYTESUR Sepsis Labs: @LABRCNTIP (procalcitonin:4,lacticidven:4)  ) Recent Results (from the past 240 hour(s))  SARS Coronavirus 2 by RT  PCR (hospital order, performed in [facility] hospital lab) Nasopharyngeal Nasopharyngeal Swab     Status: None   Collection Time: 06/21/20  5:04 PM   Specimen: Nasopharyngeal Swab  Result Value Ref Range Status   SARS Coronavirus 2 NEGATIVE NEGATIVE Final    Comment: (NOTE) SARS-CoV-2 target nucleic acids are NOT DETECTED.  The SARS-CoV-2 RNA is generally detectable in upper and lower respiratory specimens during the acute phase of infection. The lowest concentration of SARS-CoV-2 viral copies this assay can detect is 250 copies / mL. A negative result does not preclude SARS-CoV-2 infection and should not be used as the sole basis for treatment or other patient management decisions.  A negative result may occur with improper specimen collection / handling, submission of specimen other than nasopharyngeal swab, presence of viral mutation(s) within the areas targeted by this assay, and inadequate number of viral copies (<250 copies / mL). A negative result must be combined with clinical observations, patient history, and epidemiological information.  Fact Sheet  for Patients:   BoilerBrush.com.cy  Fact Sheet for Healthcare Providers: https://pope.com/  This test is not yet approved or  cleared by the [place] FDA and has been authorized for detection and/or diagnosis of SARS-CoV-2 by FDA under an Emergency Use Authorization (EUA).  This EUA will remain in effect (meaning this test can be used) for the duration of the COVID-19 declaration under Section 564(b)(1) of the Act, 21 U.S.C. section 360bbb-3(b)(1), unless the authorization is terminated or revoked sooner.  Performed at [facility], [address] [place], [place] [address]       Studies: No results found.  Scheduled Meds: . aspirin EC  81 mg Oral Daily  . atorvastatin  40 mg Oral Daily  . carvedilol  3.125 mg Oral BID WC  . cephALEXin  500 mg Oral Q6H  . enoxaparin (LOVENOX) injection  80 mg Subcutaneous Q24H  . furosemide  80 mg Intravenous BID  . hydrALAZINE  25 mg Oral Q6H  . isosorbide mononitrate  30 mg Oral Daily  . losartan  50 mg Oral Daily  . mometasone-formoterol  2 puff Inhalation BID  . nicotine  21 mg Transdermal Daily  . sodium chloride flush  3 mL Intravenous Q12H    Continuous Infusions: . sodium chloride       LOS: 2 days     Briant Cedar, MD [facility]  If 7PM-7AM, please contact night-coverage [email or web address] 06/24/2020, 2:50 PM

## 2020-06-25 LAB — CBC WITH DIFFERENTIAL/PLATELET
Abs Immature Granulocytes: 0.1 10*3/uL — ABNORMAL HIGH (ref 0.00–0.07)
Basophils Absolute: 0.1 10*3/uL (ref 0.0–0.1)
Basophils Relative: 1 %
Eosinophils Absolute: 0.5 10*3/uL (ref 0.0–0.5)
Eosinophils Relative: 3 %
HCT: 58.4 % — ABNORMAL HIGH (ref 39.0–52.0)
Hemoglobin: 17.3 g/dL — ABNORMAL HIGH (ref 13.0–17.0)
Immature Granulocytes: 1 %
Lymphocytes Relative: 15 %
Lymphs Abs: 2.1 10*3/uL (ref 0.7–4.0)
MCH: 29 pg (ref 26.0–34.0)
MCHC: 29.6 g/dL — ABNORMAL LOW (ref 30.0–36.0)
MCV: 97.8 fL (ref 80.0–100.0)
Monocytes Absolute: 1.1 10*3/uL — ABNORMAL HIGH (ref 0.1–1.0)
Monocytes Relative: 8 %
Neutro Abs: 10 10*3/uL — ABNORMAL HIGH (ref 1.7–7.7)
Neutrophils Relative %: 72 %
Platelets: 227 10*3/uL (ref 150–400)
RBC: 5.97 MIL/uL — ABNORMAL HIGH (ref 4.22–5.81)
RDW: 13.2 % (ref 11.5–15.5)
WBC: 13.8 10*3/uL — ABNORMAL HIGH (ref 4.0–10.5)
nRBC: 0 % (ref 0.0–0.2)

## 2020-06-25 LAB — BASIC METABOLIC PANEL
Anion gap: 11 (ref 5–15)
BUN: 23 mg/dL — ABNORMAL HIGH (ref 6–20)
CO2: 35 mmol/L — ABNORMAL HIGH (ref 22–32)
Calcium: 8.7 mg/dL — ABNORMAL LOW (ref 8.9–10.3)
Chloride: 91 mmol/L — ABNORMAL LOW (ref 98–111)
Creatinine, Ser: 0.99 mg/dL (ref 0.61–1.24)
GFR calc Af Amer: >60 mL/min (ref >60)
GFR calc non Af Amer: >60 mL/min (ref >60)
Glucose, Bld: 125 mg/dL — ABNORMAL HIGH (ref 70–99)
Potassium: 4.6 mmol/L (ref 3.5–5.1)
Sodium: 137 mmol/L (ref 135–145)

## 2020-06-25 NOTE — TOC Initial Note (Signed)
Transition of Care [facility]) - Initial/Assessment Note    Patient Details  Name: Joseph Morales MRN: [id number] Date of Birth: 1971/04/30  Transition of Care [facility]) CM/SW Contact:    Armanda Heritage, RN Phone Number: 06/25/2020, 12:19 PM  Clinical Narrative:    Patient set up for pcp appointment at [facility] and information placed on AVS.  CM following for p[ossible HHRN needs related to heart failure diagnosis.  Should patient require HHRN, charity referral will need to be made closer to discharge date.  Should patient discharge during the week he can utilize the [facility] and their one-free fill program to obtain his medications.  In the event of a weekend discharge, patient may need a Programmer, systems.                Expected Discharge Plan: Home/Self Care Barriers to Discharge: Continued Medical Work up   Patient Goals and CMS Choice Patient states their goals for this hospitalization and ongoing recovery are:: to go home      Expected Discharge Plan and Services Expected Discharge Plan: Home/Self Care   Discharge Planning Services: CM Consult   Living arrangements for the past 2 months: Single Family Home                 DME Arranged: N/A DME Agency: NA                  Prior Living Arrangements/Services Living arrangements for the past 2 months: Single Family Home Lives with:: Self Patient language and need for interpreter reviewed:: Yes Do you feel safe going back to the place where you live?: Yes      Need for Family Participation in Patient Care: No (Comment) Care giver support system in place?: No (comment)   Criminal Activity/Legal Involvement Pertinent to Current Situation/Hospitalization: No - Comment as needed  Activities of Daily Living Home Assistive Devices/Equipment: None ADL Screening (condition at time of admission) Patient's cognitive ability adequate to safely complete daily activities?: Yes Is the patient deaf or have difficulty hearing?: No Does the  patient have difficulty seeing, even when wearing glasses/contacts?: No Does the patient have difficulty concentrating, remembering, or making decisions?: No Patient able to express need for assistance with ADLs?: Yes Does the patient have difficulty dressing or bathing?: No Independently performs ADLs?: Yes (appropriate for developmental age) Does the patient have difficulty walking or climbing stairs?: Yes (gets short of breath) Weakness of Legs: Both Weakness of Arms/Hands: Both  Permission Sought/Granted                  Emotional Assessment Appearance:: Appears stated age Attitude/Demeanor/Rapport: Engaged Affect (typically observed): Accepting Orientation: : Oriented to Self, Oriented to Place, Oriented to  Time, Oriented to Situation   Psych Involvement: No (comment)  Admission diagnosis:  Hypoxia [R09.02] Bilateral lower extremity edema [R60.0] Acute diastolic (congestive) heart failure (HCC) [I50.31] Hypertension, unspecified type [I10] Patient Active Problem List   Diagnosis Date Noted  . Asthma 06/21/2020  . Acute diastolic (congestive) heart failure (HCC) 06/21/2020  . Hypoxia 06/21/2020  . Orthopnea 06/21/2020  . Chronic venous insufficiency 06/21/2020  . Cellulitis 06/21/2020  . Obesity, Class III, BMI 40-49.9 (morbid obesity) (HCC) 06/21/2020  . Obesity hypoventilation syndrome (HCC) 06/21/2020  . Leucocytosis 06/21/2020  . Chronic respiratory alkalosis 06/21/2020  . Hypertensive urgency 06/21/2020  . Needs sleep apnea assessment 06/21/2020  . Calf tenderness 06/21/2020   PCP:  Patient, No Pcp Per Pharmacy:   [facility] [phone] -  [place], [place] - [address] AT [place] [address] [place] [address] Phone: [phone] Fax: [phone]     Social Determinants of Health (SDOH) Interventions    Readmission Risk Interventions No flowsheet data found.

## 2020-06-25 NOTE — Plan of Care (Signed)
  Problem: Education: Goal: Knowledge of General Education information will improve Description: Including pain rating scale, medication(s)/side effects and non-pharmacologic comfort measures Outcome: Progressing   Problem: Clinical Measurements: Goal: Ability to maintain clinical measurements within normal limits will improve Outcome: Progressing Goal: Will remain free from infection Outcome: Progressing Goal: Diagnostic test results will improve Outcome: Progressing Goal: Respiratory complications will improve Outcome: Progressing Goal: Cardiovascular complication will be avoided Outcome: Progressing   Problem: Activity: Goal: Risk for activity intolerance will decrease Outcome: Progressing   Problem: Nutrition: Goal: Adequate nutrition will be maintained Outcome: Progressing   Problem: Coping: Goal: Level of anxiety will decrease Outcome: Progressing   Problem: Elimination: Goal: Will not experience complications related to bowel motility Outcome: Completed/Met Goal: Will not experience complications related to urinary retention Outcome: Completed/Met   Problem: Pain Managment: Goal: General experience of comfort will improve Outcome: Completed/Met   Problem: Skin Integrity: Goal: Risk for impaired skin integrity will decrease Outcome: Progressing   Problem: Education: Goal: Ability to demonstrate management of disease process will improve Outcome: Progressing Goal: Ability to verbalize understanding of medication therapies will improve Outcome: Progressing Goal: Individualized Educational Video(s) Outcome: Not Applicable   Problem: Activity: Goal: Capacity to carry out activities will improve Outcome: Progressing   Problem: Cardiac: Goal: Ability to achieve and maintain adequate cardiopulmonary perfusion will improve Outcome: Progressing

## 2020-06-25 NOTE — Progress Notes (Signed)
PROGRESS NOTE  Joseph Morales PPJ:[id number] DOB: [birth year] DOA: 06/21/2020 PCP: Patient, No Pcp Per  HPI/Recap of past 24 hours: HPI from Dr Dory Horn is a 49 y.o. male with past medical history of asthma and obesity, essential hypertension. Patient presented with complaints of progressively worsening SOB, ongoing for last 1 year worsening for last 1 week.  Patient denies having any chest pain, no nausea no, vomiting. Noted chronic leg swelling which is also getting worse for last 1 week. Denies having any PCP or taking any medication. Reports sleeping on couch due to difficulty lying flat for almost a year, also wakes up tired and sleeps easily during the day. Daily tobacco abuse, denies alcohol or substance abuse, except for occasional marijuana use. In the ED, found to have vol overload clinically, admitted for further management.    Today, patient denies any new complaints, denies any worsening shortness of breath, chest pain.  Patient still needs IV diuresis    Assessment/Plan: Principal Problem:   Acute diastolic (congestive) heart failure (HCC) Active Problems:   Asthma   Hypoxia   Orthopnea   Chronic venous insufficiency   Cellulitis   Obesity, Class III, BMI 40-49.9 (morbid obesity) (HCC)   Obesity hypoventilation syndrome (HCC)   Leucocytosis   Chronic respiratory alkalosis   Hypertensive urgency   Needs sleep apnea assessment   Calf tenderness   Likely acute on chronic diastolic HF Noted SOB, bilateral lower extremity edema with some hypoxia on admission BNP 33.9, could be falsely low in obese patients Troponin negative, EKG with no acute ST changes, independently reviewed Chest x-ray unremarkable ECHO with poor window due to morbid obesity, EF 70 to 75%, no regional wall motion abnormality, left ventricular diastolic parameters are indeterminate, right ventricular size is moderately enlarged Continue IV Lasix Strict I's and O's, daily weights  Acute  hypoxic respiratory failure Likely 2/2 above Vs obesity hypoventilation syndrome Vs possible COPD (current smoker) Noted to drop saturation especially while sleeping Continue diuresis for now, inhalers Supplemental oxygen as needed  Possible cellulitis Chronic venous insufficiency Currently afebrile, with leukocytosis Procalcitonin negative Noted bilateral lower extremity redness, warmth Bilateral DVT Doppler negative for any acute DVT Continue oral Keflex for now  Hypertension Not on any medication PTA Started on Imdur, hydralazine, Coreg, cozaar Monitor closely  Hyperlipidemia Start Lipitor Follow-up with PCP  Prediabetes A1c 5.9 Follow-up with PCP  OSA/obesity hypoventilation syndrome Noted elevated CO2, with elevated hematocrit Outpatient sleep study recommended  Morbid obesity Lifestyle modification advised  Tobacco abuse Smokes about half a pack a day Advised to quit Nicotine patch offered       Malnutrition Type:      Malnutrition Characteristics:      Nutrition Interventions:       Estimated body mass index is 45 kg/m as calculated from the following:   Height as of this encounter: 6\' 3"  (1.905 m).   Weight as of this encounter: 163.3 kg.     Code Status: Full  Family Communication: Discussed extensively with patient  Disposition Plan: Status is: Inpatient  Remains inpatient appropriate because:IV treatments appropriate due to intensity of illness or inability to take PO   Dispo: The patient is from: Home              Anticipated d/c is to: Home              Anticipated d/c date is: 2 days  Patient currently is not medically stable to d/c.    Consultants:  None  Procedures:  None  Antimicrobials:  P.o. Keflex  DVT prophylaxis: Lovenox   Objective: Vitals:   06/24/20 2129 06/25/20 0500 06/25/20 0525 06/25/20 1330  BP: (!) 142/87  (!) 152/95 109/90  Pulse: 94  97 90  Resp: 18  18   Temp: 99.3 F  (37.4 C)  98.3 F (36.8 C) 98.6 F (37 C)  TempSrc: Oral  Oral Oral  SpO2: 98%  98% 94%  Weight:  (!) 163.3 kg    Height:        Intake/Output Summary (Last 24 hours) at 06/25/2020 1621 Last data filed at 06/25/2020 1245 Gross per 24 hour  Intake 879 ml  Output 1400 ml  Net -521 ml   Filed Weights   06/24/20 0530 06/24/20 1300 06/25/20 0500  Weight: (!) 167.8 kg (!) 164.8 kg (!) 163.3 kg    Exam:  General: NAD, morbidly obese  Cardiovascular: S1, S2 present  Respiratory:  Diminished breath sounds bilaterally, with bibasilar crackles  Abdomen: Soft, nontender, nondistended, bowel sounds present  Musculoskeletal: 2+ bilateral pedal edema, erythema BLE  Skin:  As above, bilateral lower extremity chronic venous changes  Psychiatry: Normal mood   Data Reviewed: CBC: Recent Labs  Lab 06/21/20 1704 06/22/20 0600 06/23/20 0502 06/24/20 0528 06/25/20 0514  WBC 15.0* 14.7* 14.0* 13.6* 13.8*  NEUTROABS 10.5* 10.6* 10.0* 9.7* 10.0*  HGB 16.5 16.1 16.3 16.0 17.3*  HCT 55.0* 54.1* 54.7* 52.9* 58.4*  MCV 99.5 98.2 97.9 96.9 97.8  PLT 218 228 212 191 431   Basic Metabolic Panel: Recent Labs  Lab 06/21/20 1704 06/21/20 2115 06/22/20 0600 06/23/20 0502 06/24/20 0528 06/25/20 0514  NA 142  --  139 135 139 137  K 4.6  --  4.5 3.9 4.2 4.6  CL 97*  --  93* 89* 92* 91*  CO2 35*  --  35* 37* 37* 35*  GLUCOSE 75  --  116* 123* 118* 125*  BUN 13  --  12 17 22* 23*  CREATININE 1.05  --  0.99 1.00 0.95 0.99  CALCIUM 9.0  --  8.8* 8.4* 8.4* 8.7*  MG  --  2.0 2.3  --   --   --    GFR: Estimated Creatinine Clearance: 153 mL/min (by C-G formula based on SCr of 0.99 mg/dL). Liver Function Tests: Recent Labs  Lab 06/21/20 1704  AST 22  ALT 22  ALKPHOS 65  BILITOT 1.1  PROT 8.0  ALBUMIN 4.2   No results for input(s): LIPASE, AMYLASE in the last 168 hours. No results for input(s): AMMONIA in the last 168 hours. Coagulation Profile: No results for input(s): INR,  PROTIME in the last 168 hours. Cardiac Enzymes: No results for input(s): CKTOTAL, CKMB, CKMBINDEX, TROPONINI in the last 168 hours. BNP (last 3 results) No results for input(s): PROBNP in the last 8760 hours. HbA1C: Recent Labs    06/23/20 0502  HGBA1C 5.9*   CBG: No results for input(s): GLUCAP in the last 168 hours. Lipid Profile: Recent Labs    06/23/20 0502  CHOL 199  HDL 35*  LDLCALC 137*  TRIG 137  CHOLHDL 5.7   Thyroid Function Tests: No results for input(s): TSH, T4TOTAL, FREET4, T3FREE, THYROIDAB in the last 72 hours. Anemia Panel: No results for input(s): VITAMINB12, FOLATE, FERRITIN, TIBC, IRON, RETICCTPCT in the last 72 hours. Urine analysis: No results found for: COLORURINE, APPEARANCEUR, [place], [place], [place], [place], [place], [place],  PROTEINUR, UROBILINOGEN, NITRITE, LEUKOCYTESUR Sepsis Labs: @LABRCNTIP (procalcitonin:4,lacticidven:4)  ) Recent Results (from the past 240 hour(s))  SARS Coronavirus 2 by RT PCR (hospital order, performed in [facility] hospital lab) Nasopharyngeal Nasopharyngeal Swab     Status: None   Collection Time: 06/21/20  5:04 PM   Specimen: Nasopharyngeal Swab  Result Value Ref Range Status   SARS Coronavirus 2 NEGATIVE NEGATIVE Final    Comment: (NOTE) SARS-CoV-2 target nucleic acids are NOT DETECTED.  The SARS-CoV-2 RNA is generally detectable in upper and lower respiratory specimens during the acute phase of infection. The lowest concentration of SARS-CoV-2 viral copies this assay can detect is 250 copies / mL. A negative result does not preclude SARS-CoV-2 infection and should not be used as the sole basis for treatment or other patient management decisions.  A negative result may occur with improper specimen collection / handling, submission of specimen other than nasopharyngeal swab, presence of viral mutation(s) within the areas targeted by this assay, and inadequate number of viral copies (<250 copies / mL). A  negative result must be combined with clinical observations, patient history, and epidemiological information.  Fact Sheet for Patients:   06/23/20  Fact Sheet for Healthcare Providers: BoilerBrush.com.cy  This test is not yet approved or  cleared by the https://pope.com/ FDA and has been authorized for detection and/or diagnosis of SARS-CoV-2 by FDA under an Emergency Use Authorization (EUA).  This EUA will remain in effect (meaning this test can be used) for the duration of the COVID-19 declaration under Section 564(b)(1) of the Act, 21 U.S.C. section 360bbb-3(b)(1), unless the authorization is terminated or revoked sooner.  Performed at [facility], [address], [place], [place]       Studies: No results found.  Scheduled Meds: . aspirin EC  81 mg Oral Daily  . atorvastatin  40 mg Oral Daily  . carvedilol  3.125 mg Oral BID WC  . cephALEXin  500 mg Oral Q6H  . enoxaparin (LOVENOX) injection  80 mg Subcutaneous Q24H  . furosemide  80 mg Intravenous BID  . hydrALAZINE  25 mg Oral Q6H  . isosorbide mononitrate  30 mg Oral Daily  . losartan  50 mg Oral Daily  . mometasone-formoterol  2 puff Inhalation BID  . nicotine  21 mg Transdermal Daily  . sodium chloride flush  3 mL Intravenous Q12H    Continuous Infusions: . sodium chloride       LOS: 3 days     [id number], MD [facility]  If 7PM-7AM, please contact night-coverage [email or web address] 06/25/2020, 4:21 PM

## 2020-06-26 DIAGNOSIS — E662 Morbid (severe) obesity with alveolar hypoventilation: Secondary | ICD-10-CM

## 2020-06-26 DIAGNOSIS — R0601 Orthopnea: Secondary | ICD-10-CM

## 2020-06-26 DIAGNOSIS — Z0189 Encounter for other specified special examinations: Secondary | ICD-10-CM

## 2020-06-26 LAB — CBC WITH DIFFERENTIAL/PLATELET
Abs Immature Granulocytes: 0.08 10*3/uL — ABNORMAL HIGH (ref 0.00–0.07)
Basophils Absolute: 0.1 10*3/uL (ref 0.0–0.1)
Basophils Relative: 1 %
Eosinophils Absolute: 0.4 10*3/uL (ref 0.0–0.5)
Eosinophils Relative: 3 %
HCT: 54.4 % — ABNORMAL HIGH (ref 39.0–52.0)
Hemoglobin: 16.3 g/dL (ref 13.0–17.0)
Immature Granulocytes: 1 %
Lymphocytes Relative: 17 %
Lymphs Abs: 2.3 10*3/uL (ref 0.7–4.0)
MCH: 29.6 pg (ref 26.0–34.0)
MCHC: 30 g/dL (ref 30.0–36.0)
MCV: 98.7 fL (ref 80.0–100.0)
Monocytes Absolute: 1.3 10*3/uL — ABNORMAL HIGH (ref 0.1–1.0)
Monocytes Relative: 10 %
Neutro Abs: 9.7 10*3/uL — ABNORMAL HIGH (ref 1.7–7.7)
Neutrophils Relative %: 68 %
Platelets: 224 10*3/uL (ref 150–400)
RBC: 5.51 MIL/uL (ref 4.22–5.81)
RDW: 13.2 % (ref 11.5–15.5)
WBC: 14 10*3/uL — ABNORMAL HIGH (ref 4.0–10.5)
nRBC: 0 % (ref 0.0–0.2)

## 2020-06-26 LAB — BASIC METABOLIC PANEL
Anion gap: 9 (ref 5–15)
BUN: 29 mg/dL — ABNORMAL HIGH (ref 6–20)
CO2: 38 mmol/L — ABNORMAL HIGH (ref 22–32)
Calcium: 8.7 mg/dL — ABNORMAL LOW (ref 8.9–10.3)
Chloride: 89 mmol/L — ABNORMAL LOW (ref 98–111)
Creatinine, Ser: 1.03 mg/dL (ref 0.61–1.24)
GFR calc Af Amer: >60 mL/min (ref >60)
GFR calc non Af Amer: >60 mL/min (ref >60)
Glucose, Bld: 116 mg/dL — ABNORMAL HIGH (ref 70–99)
Potassium: 4.4 mmol/L (ref 3.5–5.1)
Sodium: 136 mmol/L (ref 135–145)

## 2020-06-26 MED ORDER — ATORVASTATIN CALCIUM 40 MG PO TABS: 40.0000 mg | ORAL_TABLET | Freq: Every day | ORAL | 0 refills | Status: DC

## 2020-06-26 MED ORDER — LOSARTAN POTASSIUM 50 MG PO TABS: 50.0000 mg | ORAL_TABLET | Freq: Every day | ORAL | 0 refills | Status: DC

## 2020-06-26 MED ORDER — CARVEDILOL 3.125 MG PO TABS: 3.1250 mg | ORAL_TABLET | Freq: Two times a day (BID) | ORAL | 0 refills | Status: DC

## 2020-06-26 MED ORDER — ALBUTEROL SULFATE HFA 108 (90 BASE) MCG/ACT IN AERS: 2.0000 | INHALATION_SPRAY | Freq: Four times a day (QID) | RESPIRATORY_TRACT | 0 refills | Status: DC | PRN

## 2020-06-26 MED ORDER — FUROSEMIDE 40 MG PO TABS
40.0000 mg | ORAL_TABLET | Freq: Two times a day (BID) | ORAL | 0 refills | Status: DC
Start: 2020-06-26 — End: 2020-08-01

## 2020-06-26 MED ORDER — ASPIRIN 81 MG PO TBEC: 81.0000 mg | DELAYED_RELEASE_TABLET | Freq: Every day | ORAL | 0 refills | Status: DC

## 2020-06-26 MED ORDER — HYDRALAZINE HCL 50 MG PO TABS: 50.0000 mg | ORAL_TABLET | Freq: Two times a day (BID) | ORAL | 0 refills | Status: DC

## 2020-06-26 MED ORDER — CEPHALEXIN 500 MG PO CAPS: 1000.0000 mg | ORAL_CAPSULE | Freq: Two times a day (BID) | ORAL | 0 refills | Status: AC

## 2020-06-26 MED ORDER — MOMETASONE FURO-FORMOTEROL FUM 100-5 MCG/ACT IN AERO: 2.0000 | INHALATION_SPRAY | Freq: Two times a day (BID) | RESPIRATORY_TRACT | 0 refills | Status: DC

## 2020-06-26 MED ORDER — NICOTINE 21 MG/24HR TD PT24: 21.0000 mg | MEDICATED_PATCH | Freq: Every day | TRANSDERMAL | 0 refills | Status: DC

## 2020-06-26 MED FILL — NICOTINE 21 MG/24HR PATCH: 21 | 28 days supply | Qty: 28 | Fill #0

## 2020-06-26 MED FILL — LOSARTAN POTASSIUM 50 MG TA: 50 | 30 days supply | Qty: 30 | Fill #0

## 2020-06-26 MED FILL — CARVEDILOL 3.125 MG TABLET: 3.125 | 30 days supply | Qty: 60 | Fill #0

## 2020-06-26 MED FILL — FUROSEMIDE 40 MG TAB: 40 | 30 days supply | Qty: 60 | Fill #0

## 2020-06-26 MED FILL — hydrALAZINE HCL 50 MG TABS: 50 | 30 days supply | Qty: 60 | Fill #0

## 2020-06-26 MED FILL — DULERA 100 MCG/5 MCG INH: 100-5 | 30 days supply | Qty: 13 | Fill #0

## 2020-06-26 MED FILL — CEPHALEXIN 500 MG CAPSULE: 500 | 3 days supply | Qty: 12 | Fill #0

## 2020-06-26 MED FILL — ATORVASTATIN CALCIUM 40 MG: 40 | 30 days supply | Qty: 30 | Fill #0

## 2020-06-26 NOTE — Progress Notes (Signed)
SATURATION QUALIFICATIONS: (This note is used to comply with regulatory documentation for home oxygen)  Patient Saturations on Room Air at Rest = 88%  Patient Saturations on Room Air while Ambulating = N/A%  Patient Saturations on 2 Liters of oxygen while Ambulating =91-92%  Please briefly explain why patient needs home oxygen:

## 2020-06-26 NOTE — Discharge Summary (Signed)
Discharge Summary  Joseph Morales WJX:[id number] DOB: 1971/04/07  PCP: Patient, No Pcp Per  Admit date: 06/21/2020 Discharge date: 06/26/2020  Time spent: 40 mins  Recommendations for Outpatient Follow-up:  1. Follow-up with [facility] to establish care with PCP    Discharge Diagnoses:  Active Hospital Problems   Diagnosis Date Noted   Acute diastolic (congestive) heart failure (HCC) 06/21/2020   Asthma 06/21/2020   Hypoxia 06/21/2020   Orthopnea 06/21/2020   Chronic venous insufficiency 06/21/2020   Cellulitis 06/21/2020   Obesity, Class III, BMI 40-49.9 (morbid obesity) (HCC) 06/21/2020   Obesity hypoventilation syndrome (HCC) 06/21/2020   Leucocytosis 06/21/2020   Chronic respiratory alkalosis 06/21/2020   Hypertensive urgency 06/21/2020   Needs sleep apnea assessment 06/21/2020   Calf tenderness 06/21/2020    Resolved Hospital Problems  No resolved problems to display.    Discharge Condition: Stable  Diet recommendation: Heart healthy/moderate carb  Vitals:   06/26/20 0458 06/26/20 0911  BP: (!) 148/84   Pulse: 97   Resp: 18   Temp: 97.6 F (36.4 C)   SpO2: 98% 97%    History of present illness:  Joseph Isleyis a 48 y.o.malewith past medical history ofasthma and obesity, essential hypertension. Patient presented with complaints of progressively worsening SOB, ongoing for last 1 year worsening for last 1 week. Patient denies having any chest pain, no nausea no, vomiting. Noted chronic leg swelling which is also getting worse for last 1 week. Denies having any PCP or taking any medication. Reports sleeping on couch due to difficulty lying flat for almost a year, also wakes up tired and sleeps easily during the day. Daily tobacco abuse, denies alcohol or substance abuse, except for occasional marijuana use. In the ED, found to have vol overload clinically, admitted for further management.    Today, patient reports feeling better,  denies any worsening shortness of breath, chest pain, abdominal pain, nausea/vomiting, fever/chills.  Patient noted to be still requiring O2, sometimes desaturates in his sleep.  Patient tolerated ambulating the hallway without any worsening shortness of breath or dizziness.  Plan to discharge patient home with close follow-up with [facility].  Advised to be compliant with diet, medication, and appointments.   Hospital Course:  Principal Problem:   Acute diastolic (congestive) heart failure (HCC) Active Problems:   Asthma   Hypoxia   Orthopnea   Chronic venous insufficiency   Cellulitis   Obesity, Class III, BMI 40-49.9 (morbid obesity) (HCC)   Obesity hypoventilation syndrome (HCC)   Leucocytosis   Chronic respiratory alkalosis   Hypertensive urgency   Needs sleep apnea assessment   Calf tenderness   Acute on chronic diastolic HF Noted SOB, bilateral lower extremity edema with hypoxia on admission BNP 33.9, could be falsely low in obese patients Troponin negative, EKG with no acute ST changes, independently reviewed Chest x-ray unremarkable ECHO with poor window due to morbid obesity, EF 70 to 75%, no regional wall motion abnormality, left ventricular diastolic parameters are indeterminate, right ventricular size is moderately enlarged S/P IV lasix 80 mg BID, continue lasix 40 mg BID Output (-10,637), weight 167.6 lbs-->163 lbs Advised on diet modification, medication compliance  Acute hypoxic respiratory failure Still requiring about 2 L of O2 Likely 2/2 above Vs obesity hypoventilation syndrome Vs possible COPD (current smoker) Noted to drop saturation especially while sleeping Continue inhalers, further diuresis Patient will need home O2, 2 L  Possible cellulitis Chronic venous insufficiency Currently afebrile, with persistent leukocytosis Procalcitonin negative Noted bilateral  lower extremity redness, warmth Bilateral DVT Doppler negative for any acute  DVT Continue oral Keflex for a total of 7 days  Hypertension Not on any medication PTA Discharged on p.o. hydralazine, Coreg, cozaar Follow-up with PCP  Hyperlipidemia Continue Lipitor Follow-up with PCP  Prediabetes A1c 5.9 Follow-up with PCP, consider starting Metformin  OSA/obesity hypoventilation syndrome Noted elevated CO2, with elevated hematocrit Outpatient sleep study recommended for evaluation of the need for CPAP In the meantime continue home O2  Morbid obesity Lifestyle modification advised  Tobacco abuse Smokes about half a pack a day Advised to quit Nicotine patch offered        Malnutrition Type:      Malnutrition Characteristics:      Nutrition Interventions:      Estimated body mass index is 44.92 kg/m as calculated from the following:   Height as of this encounter:  (1.905 m).   Weight as of this encounter: 163 kg.    Procedures:  None  Consultations:  None  Discharge Exam: BP (!) 148/84 (BP Location: Right Arm)    Pulse 97    Temp 97.6 F (36.4 C) (Oral)    Resp 18    Ht  (1.905 m)    Wt (!) 163 kg    SpO2 97%    BMI 44.92 kg/m   General: NAD Cardiovascular: S1, S2 present Respiratory: Diminished breath sounds bilaterally     Discharge Instructions You were cared for by a hospitalist during your hospital stay. If you have any questions about your discharge medications or the care you received while you were in the hospital after you are discharged, you can call the unit and asked to speak with the hospitalist on call if the hospitalist that took care of you is not available. Once you are discharged, your primary care physician will handle any further medical issues. Please note that NO REFILLS for any discharge medications will be authorized once you are discharged, as it is imperative that you return to your primary care physician (or establish a relationship with a primary care physician if you do not have  one) for your aftercare needs so that they can reassess your need for medications and monitor your lab values.  Discharge Instructions    Diet - low sodium heart healthy   Complete by: As directed    Increase activity slowly   Complete by: As directed      Allergies as of 06/26/2020   No Known Allergies     Medication List    STOP taking these medications   UNABLE TO FIND     TAKE these medications   albuterol 108 (90 Base) MCG/ACT inhaler Commonly known as: VENTOLIN HFA Inhale 2 puffs into the lungs every 6 (six) hours as needed for wheezing or shortness of breath. What changed: when to take this   aspirin 81 MG EC tablet Take 1 tablet (81 mg total) by mouth daily. Swallow whole. Start taking on: June 27, 2020   atorvastatin 40 MG tablet Commonly known as: LIPITOR Take 1 tablet (40 mg total) by mouth daily. Start taking on: June 27, 2020   carvedilol 3.125 MG tablet Commonly known as: COREG Take 1 tablet (3.125 mg total) by mouth 2 (two) times daily with a meal.   cephALEXin 500 MG capsule Commonly known as: KEFLEX Take 2 capsules (1,000 mg total) by mouth 2 (two) times daily for 3 days.   CINNAMON PO Take 1 capsule by mouth daily.  furosemide 40 MG tablet Commonly known as: Lasix Take 1 tablet (40 mg total) by mouth 2 (two) times daily.   hydrALAZINE 50 MG tablet Commonly known as: APRESOLINE Take 1 tablet (50 mg total) by mouth in the morning and at bedtime.   losartan 50 MG tablet Commonly known as: COZAAR Take 1 tablet (50 mg total) by mouth daily. Start taking on: June 27, 2020   mometasone-formoterol 100-5 MCG/ACT Aero Commonly known as: DULERA Inhale 2 puffs into the lungs 2 (two) times daily.   nicotine 21 mg/24hr patch Commonly known as: NICODERM CQ - dosed in mg/24 hours Place 1 patch (21 mg total) onto the skin daily. Start taking on: June 27, 2020            Durable Medical Equipment  (From admission, onward)         Start      Ordered   06/26/20 1225  For home use only DME oxygen  Once       Question Answer Comment  Length of Need 6 Months   Mode or (Route) Nasal cannula   Liters per Minute 2   Frequency Continuous (stationary and portable oxygen unit needed)   Oxygen delivery system Gas      06/26/20 1224         No Known Allergies  Follow-up Information    [facility] Follow up.   Why: you have a follow up appointment on 07/12/20 at 330 pm to establish primary care.  please ask the clinic about application for financial assistance with cost of care and medications. Contact information: [address] [place] [address] [phone]               The results of significant diagnostics from this hospitalization (including imaging, microbiology, ancillary and laboratory) are listed below for reference.    Significant Diagnostic Studies: DG Chest 2 View  Result Date: 06/21/2020 CLINICAL DATA:  Peripheral edema and hypoxia EXAM: CHEST - 2 VIEW COMPARISON:  None. FINDINGS: 10/05/2019 IMPRESSION: No active cardiopulmonary disease. Electronically Signed   By: Jasmine Pang M.D.   On: 06/21/2020 16:36   ECHOCARDIOGRAM COMPLETE  Result Date: 06/22/2020    ECHOCARDIOGRAM REPORT   Patient Name:   ANTWINE AGOSTO Date of Exam: 06/22/2020 Medical Rec #:  [id number]   Height:       75.0 in Accession #:    [id number]  Weight:       340.0 lb Date of Birth:  [birth year]   BSA:          2.750 m Patient Age:    48 years    BP:           123/71 mmHg Patient Gender: M           HR:           92 bpm. Exam Location:  Inpatient Procedure: 2D Echo, Cardiac Doppler and Color Doppler Indications:    I50.30* Unspecified diastolic (congestive) heart failure  History:        Patient has no prior history of Echocardiogram examinations.                 CHF, Abnormal ECG; Risk Factors:Hypertension and Current Smoker.                 Marijuana use.  Sonographer:    Sheralyn Boatman RDCS  Referring Phys: [id number] [facility] M PATEL  Sonographer Comments: Technically difficult study due to  poor echo windows, patient is morbidly obese, suboptimal parasternal window, suboptimal apical window and suboptimal subcostal window. Image acquisition challenging due to patient body habitus. IMPRESSIONS  1. Left ventricular ejection fraction, by estimation, is 70 to 75%. The left ventricle has hyperdynamic function. The left ventricle has no regional wall motion abnormalities. There is mild left ventricular hypertrophy. Left ventricular diastolic parameters are indeterminate.  2. Leftward septal shift with respiratory variation, could suggest primary lung pathology vs constrictive physiology.  3. Right ventricular systolic function is mildly reduced. The right ventricular size is moderately enlarged. Tricuspid regurgitation signal is inadequate for assessing PA pressure.  4. The mitral valve is grossly normal. No evidence of mitral valve regurgitation.  5. The aortic valve was not well visualized. Aortic valve regurgitation is not visualized.  6. The inferior vena cava is dilated in size with <50% respiratory variability, suggesting right atrial pressure of 15 mmHg. FINDINGS  Left Ventricle: Left ventricular ejection fraction, by estimation, is 70 to 75%. The left ventricle has hyperdynamic function. The left ventricle has no regional wall motion abnormalities. The left ventricular internal cavity size was normal in size. There is mild left ventricular hypertrophy. Left ventricular diastolic parameters are indeterminate. Right Ventricle: The right ventricular size is moderately enlarged. Right vetricular wall thickness was not assessed. Right ventricular systolic function is mildly reduced. Tricuspid regurgitation signal is inadequate for assessing PA pressure. Left Atrium: Left atrial size was not well visualized. Right Atrium: Right atrial size was not well visualized. Pericardium: There is no evidence of  pericardial effusion. Mitral Valve: The mitral valve is grossly normal. No evidence of mitral valve regurgitation. Tricuspid Valve: The tricuspid valve is not well visualized. Tricuspid valve regurgitation is not demonstrated. No evidence of tricuspid stenosis. Aortic Valve: The aortic valve was not well visualized. Aortic valve regurgitation is not visualized. Pulmonic Valve: The pulmonic valve was not well visualized. Pulmonic valve regurgitation is not visualized. No evidence of pulmonic stenosis. Aorta: The aortic root was not well visualized and the ascending aorta was not well visualized. Venous: The inferior vena cava is dilated in size with less than 50% respiratory variability, suggesting right atrial pressure of 15 mmHg. IAS/Shunts: The interatrial septum was not well visualized.  LEFT VENTRICLE PLAX 2D LVIDd:         3.68 cm     Diastology LVIDs:         3.04 cm     LV e' lateral:   9.79 cm/s LV PW:         1.27 cm     LV E/e' lateral: 6.8 LV IVS:        1.32 cm     LV e' medial:    7.18 cm/s LVOT diam:     2.30 cm     LV E/e' medial:  9.3 LV SV:         66 LV SV Index:   24 LVOT Area:     4.15 cm  LV Volumes (MOD) LV vol d, MOD A2C: 45.0 ml LV vol d, MOD A4C: 79.8 ml LV vol s, MOD A2C: 19.4 ml LV vol s, MOD A4C: 24.1 ml LV SV MOD A2C:     25.6 ml LV SV MOD A4C:     79.8 ml LV SV MOD BP:      39.4 ml RIGHT VENTRICLE             IVC RV S prime:     11.70 cm/s  IVC diam: 2.29  cm TAPSE (M-mode): 1.8 cm LEFT ATRIUM             Index      RIGHT ATRIUM           Index LA diam:        2.90 cm 1.05 cm/m RA Area:     11.90 cm LA Vol (A2C):   16.9 ml 6.15 ml/m RA Volume:   28.80 ml  10.47 ml/m LA Vol (A4C):   19.8 ml 7.20 ml/m LA Biplane Vol: 18.3 ml 6.66 ml/m  AORTIC VALVE LVOT Vmax:   81.30 cm/s LVOT Vmean:  58.600 cm/s LVOT VTI:    0.159 m  AORTA Ao Root diam: 3.00 cm Ao Asc diam:  3.00 cm MITRAL VALVE MV Area (PHT): 2.91 cm    SHUNTS MV Decel Time: 261 msec    Systemic VTI:  0.16 m MV E velocity: 66.80  cm/s  Systemic Diam: 2.30 cm MV A velocity: 57.60 cm/s MV E/A ratio:  1.16 Weston Brass MD Electronically signed by Weston Brass MD Signature Date/Time: 06/22/2020/11:17:46 AM    Final    VAS [place] LOWER EXTREMITY VENOUS (DVT)  Result Date: 06/23/2020  Lower Venous DVTStudy Indications: Pain, and Swelling.  Risk Factors: None identified. Limitations: Body habitus, poor ultrasound/tissue interface and patient pain tolerance. Comparison Study: No prior studies. Performing Technologist: Chanda Busing RVT  Examination Guidelines: A complete evaluation includes B-mode imaging, spectral Doppler, color Doppler, and power Doppler as needed of all accessible portions of each vessel. Bilateral testing is considered an integral part of a complete examination. Limited examinations for reoccurring indications may be performed as noted. The reflux portion of the exam is performed with the patient in reverse Trendelenburg.  +---------+---------------+---------+-----------+----------+-------------------+  RIGHT     Compressibility Phasicity Spontaneity Properties Thrombus Aging       +---------+---------------+---------+-----------+----------+-------------------+  CFV       Full            Yes       Yes                                         +---------+---------------+---------+-----------+----------+-------------------+  SFJ       Full                                                                  +---------+---------------+---------+-----------+----------+-------------------+  FV Prox   Full                                                                  +---------+---------------+---------+-----------+----------+-------------------+  FV Mid    Full                                                                  +---------+---------------+---------+-----------+----------+-------------------+  FV Distal                 Yes       Yes                                          +---------+---------------+---------+-----------+----------+-------------------+  PFV       Full                                                                  +---------+---------------+---------+-----------+----------+-------------------+  POP       Full            Yes       Yes                                         +---------+---------------+---------+-----------+----------+-------------------+  PTV                                                        Patency shown with                                                               color doppler        +---------+---------------+---------+-----------+----------+-------------------+  PERO                                                       Not visualized       +---------+---------------+---------+-----------+----------+-------------------+   +---------+---------------+---------+-----------+----------+--------------+  LEFT      Compressibility Phasicity Spontaneity Properties Thrombus Aging  +---------+---------------+---------+-----------+----------+--------------+  CFV       Full            Yes       Yes                                    +---------+---------------+---------+-----------+----------+--------------+  SFJ       Full                                                             +---------+---------------+---------+-----------+----------+--------------+  FV Prox   Full                                                             +---------+---------------+---------+-----------+----------+--------------+  FV Mid    Full                                                             +---------+---------------+---------+-----------+----------+--------------+  FV Distal                 Yes       Yes                                    +---------+---------------+---------+-----------+----------+--------------+  PFV       Full                                                              +---------+---------------+---------+-----------+----------+--------------+  POP       Full            Yes       Yes                                    +---------+---------------+---------+-----------+----------+--------------+  PTV       Full                                                             +---------+---------------+---------+-----------+----------+--------------+  PERO      Full                                                             +---------+---------------+---------+-----------+----------+--------------+     Summary: RIGHT: - There is no evidence of deep vein thrombosis in the lower extremity. However, portions of this examination were limited- see technologist comments above.  - No cystic structure found in the popliteal fossa.  LEFT: - There is no evidence of deep vein thrombosis in the lower extremity. However, portions of this examination were limited- see technologist comments above.  - No cystic structure found in the popliteal fossa.  *See table(s) above for measurements and observations. Electronically signed by Waverly Ferrari MD on 06/23/2020 at 6:26:59 AM.    Final     Microbiology: Recent Results (from the past 240 hour(s))  SARS Coronavirus 2 by RT PCR (hospital order, performed in [facility] hospital lab) Nasopharyngeal Nasopharyngeal Swab     Status: None   Collection Time: 06/21/20  5:04 PM   Specimen: Nasopharyngeal Swab  Result Value Ref Range Status   SARS Coronavirus 2 NEGATIVE NEGATIVE Final    Comment: (NOTE) SARS-CoV-2 target nucleic acids are NOT DETECTED.  The SARS-CoV-2 RNA is generally detectable in upper and lower respiratory specimens during the acute phase of infection. The lowest concentration  of SARS-CoV-2 viral copies this assay can detect is 250 copies / mL. A negative result does not preclude SARS-CoV-2 infection and should not be used as the sole basis for treatment or other patient management decisions.  A negative result may occur  with improper specimen collection / handling, submission of specimen other than nasopharyngeal swab, presence of viral mutation(s) within the areas targeted by this assay, and inadequate number of viral copies (<250 copies / mL). A negative result must be combined with clinical observations, patient history, and epidemiological information.  Fact Sheet for Patients:   BoilerBrush.com.cyhttps://www.fda.gov/media/136312/download  Fact Sheet for Healthcare Providers: https://pope.com/https://www.fda.gov/media/136313/download  This test is not yet approved or  cleared by the [place] FDA and has been authorized for detection and/or diagnosis of SARS-CoV-2 by FDA under an Emergency Use Authorization (EUA).  This EUA will remain in effect (meaning this test can be used) for the duration of the COVID-19 declaration under Section 564(b)(1) of the Act, 21 U.S.C. section 360bbb-3(b)(1), unless the authorization is terminated or revoked sooner.  Performed at [facility], [address] [place], [place] [address]      Labs: Basic Metabolic Panel: Recent Labs  Lab 06/21/20 1704 06/21/20 2115 06/22/20 0600 06/23/20 0502 06/24/20 0528 06/25/20 0514 06/26/20 0438  NA   < >  --  139 135 139 137 136  K   < >  --  4.5 3.9 4.2 4.6 4.4  CL   < >  --  93* 89* 92* 91* 89*  CO2   < >  --  35* 37* 37* 35* 38*  GLUCOSE   < >  --  116* 123* 118* 125* 116*  BUN   < >  --  12 17 22* 23* 29*  CREATININE   < >  --  0.99 1.00 0.95 0.99 1.03  CALCIUM   < >  --  8.8* 8.4* 8.4* 8.7* 8.7*  MG  --  2.0 2.3  --   --   --   --    < > = values in this interval not displayed.   Liver Function Tests: Recent Labs  Lab 06/21/20 1704  AST 22  ALT 22  ALKPHOS 65  BILITOT 1.1  PROT 8.0  ALBUMIN 4.2   No results for input(s): LIPASE, AMYLASE in the last 168 hours. No results for input(s): AMMONIA in the last 168 hours. CBC: Recent Labs  Lab 06/22/20 0600 06/23/20 0502 06/24/20 0528 06/25/20 0514  06/26/20 0438  WBC 14.7* 14.0* 13.6* 13.8* 14.0*  NEUTROABS 10.6* 10.0* 9.7* 10.0* 9.7*  HGB 16.1 16.3 16.0 17.3* 16.3  HCT 54.1* 54.7* 52.9* 58.4* 54.4*  MCV 98.2 97.9 96.9 97.8 98.7  PLT 228 212 191 227 224   Cardiac Enzymes: No results for input(s): CKTOTAL, CKMB, CKMBINDEX, TROPONINI in the last 168 hours. BNP: BNP (last 3 results) Recent Labs    06/21/20 1704  BNP 33.9    ProBNP (last 3 results) No results for input(s): PROBNP in the last 8760 hours.  CBG: No results for input(s): GLUCAP in the last 168 hours.     Signed:  Briant CedarNkeiruka J Irvine Glorioso, MD [facility] 06/26/2020, 1:02 PM

## 2020-06-26 NOTE — TOC Progression Note (Signed)
Transition of Care [facility]) - Progression Note    Patient Details  Name: Joseph Morales MRN: [id number] Date of Birth: 1971/04/30  Transition of Care [facility]) CM/SW Contact  Armanda Heritage, RN Phone Number: 06/26/2020, 12:44 PM  Clinical Narrative:    Referral for home oxygen given to [employer] rep Zach.   Expected Discharge Plan: Home/Self Care Barriers to Discharge: Continued Medical Work up  Expected Discharge Plan and Services Expected Discharge Plan: Home/Self Care   Discharge Planning Services: CM Consult   Living arrangements for the past 2 months: Single Family Home Expected Discharge Date: 06/26/20               DME Arranged: Oxygen DME Agency: [employer] Date DME Agency Contacted: 06/26/20 Time DME Agency Contacted: [phone] Representative spoke with at DME Agency: Ian Malkin             Social Determinants of Health (SDOH) Interventions    Readmission Risk Interventions No flowsheet data found.

## 2020-06-26 NOTE — Plan of Care (Signed)
  Problem: Education: Goal: Knowledge of General Education information will improve Description: Including pain rating scale, medication(s)/side effects and non-pharmacologic comfort measures Outcome: Completed/Met   Problem: Health Behavior/Discharge Planning: Goal: Ability to manage health-related needs will improve Outcome: Progressing   Problem: Clinical Measurements: Goal: Ability to maintain clinical measurements within normal limits will improve Outcome: Progressing Goal: Will remain free from infection Outcome: Progressing Goal: Diagnostic test results will improve Outcome: Progressing Goal: Respiratory complications will improve Outcome: Progressing Goal: Cardiovascular complication will be avoided Outcome: Progressing   Problem: Activity: Goal: Risk for activity intolerance will decrease Outcome: Progressing   Problem: Nutrition: Goal: Adequate nutrition will be maintained Outcome: Completed/Met   Problem: Coping: Goal: Level of anxiety will decrease Outcome: Completed/Met   Problem: Safety: Goal: Ability to remain free from injury will improve Outcome: Progressing   Problem: Skin Integrity: Goal: Risk for impaired skin integrity will decrease Outcome: Progressing   Problem: Education: Goal: Ability to demonstrate management of disease process will improve Outcome: Progressing Goal: Ability to verbalize understanding of medication therapies will improve Outcome: Progressing   Problem: Activity: Goal: Capacity to carry out activities will improve Outcome: Progressing   Problem: Cardiac: Goal: Ability to achieve and maintain adequate cardiopulmonary perfusion will improve Outcome: Progressing

## 2020-06-26 NOTE — Progress Notes (Signed)
Pt discharged home today per Dr. Sharolyn Douglas. Pt's IV site D/C'd and WDL. Pt's VSS. Pt provided with home medication list, discharge instructions and prescriptions. Verbalized understanding. Pt received home O2 tank prior to leaving. Pt left floor via WC in stable condition accompanied by NT.

## 2020-06-26 NOTE — Plan of Care (Signed)
  Problem: Education: Goal: Knowledge of General Education information will improve Description: Including pain rating scale, medication(s)/side effects and non-pharmacologic comfort measures Outcome: Completed/Met   Problem: Health Behavior/Discharge Planning: Goal: Ability to manage health-related needs will improve 06/26/2020 1304 by Annie Sable, RN Outcome: Completed/Met 06/26/2020 0849 by Annie Sable, RN Outcome: Progressing   Problem: Clinical Measurements: Goal: Ability to maintain clinical measurements within normal limits will improve 06/26/2020 1304 by Annie Sable, RN Outcome: Completed/Met 06/26/2020 0849 by Annie Sable, RN Outcome: Progressing Goal: Will remain free from infection 06/26/2020 1304 by Annie Sable, RN Outcome: Completed/Met 06/26/2020 0849 by Annie Sable, RN Outcome: Progressing Goal: Diagnostic test results will improve 06/26/2020 1304 by Annie Sable, RN Outcome: Completed/Met 06/26/2020 0849 by Annie Sable, RN Outcome: Progressing Goal: Respiratory complications will improve 06/26/2020 1304 by Annie Sable, RN Outcome: Completed/Met 06/26/2020 0849 by Annie Sable, RN Outcome: Progressing Goal: Cardiovascular complication will be avoided 06/26/2020 1304 by Annie Sable, RN Outcome: Completed/Met 06/26/2020 0849 by Annie Sable, RN Outcome: Progressing   Problem: Activity: Goal: Risk for activity intolerance will decrease 06/26/2020 1304 by Annie Sable, RN Outcome: Completed/Met 06/26/2020 0849 by Annie Sable, RN Outcome: Progressing   Problem: Nutrition: Goal: Adequate nutrition will be maintained Outcome: Completed/Met   Problem: Coping: Goal: Level of anxiety will decrease Outcome: Completed/Met   Problem: Safety: Goal: Ability to remain free from injury will improve 06/26/2020 1304 by Annie Sable, RN Outcome: Completed/Met 06/26/2020 0849 by Annie Sable, RN Outcome: Progressing    Problem: Skin Integrity: Goal: Risk for impaired skin integrity will decrease 06/26/2020 1304 by Annie Sable, RN Outcome: Completed/Met 06/26/2020 0849 by Annie Sable, RN Outcome: Progressing   Problem: Education: Goal: Ability to demonstrate management of disease process will improve 06/26/2020 1304 by Annie Sable, RN Outcome: Completed/Met 06/26/2020 0849 by Annie Sable, RN Outcome: Progressing Goal: Ability to verbalize understanding of medication therapies will improve 06/26/2020 1304 by Annie Sable, RN Outcome: Completed/Met 06/26/2020 0849 by Annie Sable, RN Outcome: Progressing   Problem: Activity: Goal: Capacity to carry out activities will improve 06/26/2020 1304 by Annie Sable, RN Outcome: Completed/Met 06/26/2020 0849 by Annie Sable, RN Outcome: Progressing   Problem: Cardiac: Goal: Ability to achieve and maintain adequate cardiopulmonary perfusion will improve 06/26/2020 1304 by Annie Sable, RN Outcome: Completed/Met 06/26/2020 0849 by Annie Sable, RN Outcome: Progressing

## 2020-07-12 ENCOUNTER — Inpatient Hospital Stay: Payer: Self-pay | Admitting: Physician Assistant

## 2020-07-26 NOTE — Progress Notes (Signed)
Patient ID: Joseph Morales, male   DOB: July 29, 1971, 49 y.o.   MRN: [id number]     Joseph Morales, is a 49 y.o. male  EQA:[id number]  LNL:[id number]  DOB - [birth year]  Subjective:  Chief Complaint and HPI: Joseph Morales is a 49 y.o. male here today to establish care and for a follow up visit After hospitalization 6/24-6/29/2021.  He is feeling much better..   He has not f/up with cardiology or had sleep study bc he still hasn't applied for financial aid.  He is only using O2 at night.  SOB much improved.  No CP.  Needs to get RF of meds.  Has not taken BP meds today.  He is working on his diet for elevated blood sugars.  No pain or swelling in legs now.    From discharge summary: History of present illness:  Joseph Morales a 49 y.o.malewith past medical history ofasthma and obesity, essential hypertension. Patient presented with complaints of progressively worsening SOB, ongoing for last 1 year worsening for last 1 week. Patient denies having any chest pain, no nausea no, vomiting. Noted chronic leg swelling which is also getting worse for last 1 week. Denies having any PCP or taking any medication. Reports sleeping on couch due to difficulty lying flat for almost a year, also wakes up tired and sleeps easily during the day. Daily tobacco abuse, denies alcohol or substance abuse, except for occasional marijuana use. In the ED, found to have vol overload clinically, admitted for further management.    Today, patient reports feeling better, denies any worsening shortness of breath, chest pain, abdominal pain, nausea/vomiting, fever/chills.  Patient noted to be still requiring O2, sometimes desaturates in his sleep.  Patient tolerated ambulating the hallway without any worsening shortness of breath or dizziness.  Plan to discharge patient home with close follow-up with [facility].  Advised to be compliant with diet, medication, and appointments.   Hospital Course:  Principal Problem:    Acute diastolic (congestive) heart failure (HCC) Active Problems:   Asthma   Hypoxia   Orthopnea   Chronic venous insufficiency   Cellulitis   Obesity, Class III, BMI 40-49.9 (morbid obesity) (HCC)   Obesity hypoventilation syndrome (HCC)   Leucocytosis   Chronic respiratory alkalosis   Hypertensive urgency   Needs sleep apnea assessment   Calf tenderness   Acute on chronic diastolic HF Noted SOB, bilateral lower extremity edema with hypoxia on admission BNP 33.9, could be falsely low in obese patients Troponin negative, EKG with no acute ST changes, independently reviewed Chest x-ray unremarkable ECHO with poor window due to morbid obesity, EF 70 to 75%, no regional wall motion abnormality, left ventricular diastolic parameters are indeterminate, right ventricular size is moderately enlarged S/P IV lasix 80 mg BID, continue lasix 40 mg BID Output (-10,637), weight 167.6 lbs-->163 lbs Advised on diet modification, medication compliance  Acute hypoxic respiratory failure Still requiring about 2 L of O2 Likely 2/2 above Vs obesity hypoventilation syndrome Vs possible COPD (current smoker) Noted to drop saturation especially while sleeping Continue inhalers, further diuresis Patient will need home O2, 2 L  Possible cellulitis Chronic venous insufficiency Currently afebrile, with persistent leukocytosis Procalcitonin negative Noted bilateral lower extremity redness, warmth Bilateral DVT Doppler negative for any acute DVT Continue oral Keflex for a total of 7 days  Hypertension Not on any medication PTA Discharged on p.o. hydralazine, Coreg, cozaar Follow-up with PCP  Hyperlipidemia Continue Lipitor Follow-up with PCP  Prediabetes A1c 5.9 Follow-up with PCP,  consider starting Metformin  OSA/obesity hypoventilation syndrome Noted elevated CO2, with elevated hematocrit Outpatient sleep study recommended for evaluation of the need for CPAP In the meantime  continue home O2  Morbid obesity Lifestyle modification advised  Tobacco abuse Smokes about half a pack a day Advised to quit Nicotine patch offered   ED/Hospital notes reviewed.   Social History: Family history:  ROS:   Constitutional:  No f/c, No night sweats, No unexplained weight loss. EENT:  No vision changes, No blurry vision, No hearing changes. No mouth, throat, or ear problems.  Respiratory: No cough, No SOB Cardiac: No CP, no palpitations GI:  No abd pain, No N/V/D. GU: No Urinary s/sx Musculoskeletal: No joint pain Neuro: No headache, no dizziness, no motor weakness.  Skin: No rash Endocrine:  No polydipsia. No polyuria.  Psych: Denies SI/HI  No problems updated.  ALLERGIES: No Known Allergies  PAST MEDICAL HISTORY: Past Medical History:  Diagnosis Date   Asthma    CHF (congestive heart failure) (HCC)     MEDICATIONS AT HOME: Prior to Admission medications   Medication Sig Start Date End Date Taking? Authorizing Provider  albuterol (VENTOLIN HFA) 108 (90 Base) MCG/ACT inhaler Inhale 2 puffs into the lungs every 6 (six) hours as needed for wheezing or shortness of breath. 08/01/20   Anders Simmonds, PA-C  aspirin 81 MG EC tablet Take 1 tablet (81 mg total) by mouth daily. Swallow whole. 08/01/20   Anders Simmonds, PA-C  atorvastatin (LIPITOR) 40 MG tablet Take 1 tablet (40 mg total) by mouth daily. 08/01/20 08/31/20  Anders Simmonds, PA-C  carvedilol (COREG) 3.125 MG tablet Take 1 tablet (3.125 mg total) by mouth 2 (two) times daily with a meal. 08/01/20 08/31/20  Jabari Swoveland, Marzella Schlein, PA-C  CINNAMON PO Take 1 capsule by mouth daily.    [provider]  furosemide (LASIX) 40 MG tablet Take 1 tablet (40 mg total) by mouth 2 (two) times daily. 08/01/20 08/31/20  Anders Simmonds, PA-C  hydrALAZINE (APRESOLINE) 50 MG tablet Take 1 tablet (50 mg total) by mouth in the morning and at bedtime. 08/01/20 08/31/20  Anders Simmonds, PA-C  losartan (COZAAR) 50 MG  tablet Take 1 tablet (50 mg total) by mouth daily. 08/01/20 08/31/20  Anders Simmonds, PA-C  mometasone-formoterol (DULERA) 100-5 MCG/ACT AERO Inhale 2 puffs into the lungs 2 (two) times daily. 08/01/20   Anders Simmonds, PA-C  nicotine (NICODERM CQ - DOSED IN MG/24 HOURS) 21 mg/24hr patch Place 1 patch (21 mg total) onto the skin daily. 06/27/20   Briant Cedar, MD     Objective:  EXAM:   Vitals:   08/01/20 1349  BP: (!) 149/80  Pulse: 95  Resp: 16  SpO2: 90%  Weight: (!) 372 lb 6.4 oz (168.9 kg)  Height: 6\' 3"  (1.905 m)    General appearance : A&OX3. NAD. Non-toxic-appearing HEENT: Atraumatic and Normocephalic.  PERRLA. EOM intact.  Neck: supple, no JVD. No cervical lymphadenopathy. No thyromegaly Chest/Lungs:  Breathing-non-labored, Good air entry bilaterally, breath sounds normal without rales, rhonchi, or wheezing  CVS: S1 S2 regular, no murmurs, gallops, rubs  Extremities: Bilateral Lower Ext shows <1+ edema, both legs are warm to touch with = pulse throughout.  No erythema ow seeping areas Neurology:  CN II-XII grossly intact, Non focal.   Psych:  TP linear. J/I fair. Normal speech. Appropriate eye contact and affect.  Skin:  No Rash  Data Review Lab Results  Component Value Date  HGBA1C 5.9 (H) 06/23/2020     Assessment & Plan   1. Acute diastolic (congestive) heart failure (HCC) He will get financial packet today - aspirin 81 MG EC tablet; Take 1 tablet (81 mg total) by mouth daily. Swallow whole.  Dispense: 100 tablet; Refill: 1 - Ambulatory referral to Cardiology - carvedilol (COREG) 3.125 MG tablet; Take 1 tablet (3.125 mg total) by mouth 2 (two) times daily with a meal.  Dispense: 60 tablet; Refill: 5 - hydrALAZINE (APRESOLINE) 50 MG tablet; Take 1 tablet (50 mg total) by mouth in the morning and at bedtime.  Dispense: 60 tablet; Refill: 2 - furosemide (LASIX) 40 MG tablet; Take 1 tablet (40 mg total) by mouth 2 (two) times daily.  Dispense: 60 tablet;  Refill: 5  2. Chronic venous insufficiency - aspirin 81 MG EC tablet; Take 1 tablet (81 mg total) by mouth daily. Swallow whole.  Dispense: 100 tablet; Refill: 1 - Ambulatory referral to Cardiology - furosemide (LASIX) 40 MG tablet; Take 1 tablet (40 mg total) by mouth 2 (two) times daily.  Dispense: 60 tablet; Refill: 5 - CBC with Differential/Platelet - Comprehensive metabolic panel  3. Obesity hypoventilation syndrome (HCC) Weight loss recommended  4. Needs sleep apnea assessment Will await financial assistance  5. Hyperlipidemia, unspecified hyperlipidemia type And hyperglycemia - atorvastatin (LIPITOR) 40 MG tablet; Take 1 tablet (40 mg total) by mouth daily.  Dispense: 30 tablet; Refill: 5 I have had a lengthy discussion and provided education about insulin resistance and the intake of too much sugar/refined carbohydrates.  I have advised the patient to work at a goal of eliminating sugary drinks, candy, desserts, sweets, refined sugars, processed foods, and white carbohydrates.  The patient expresses understanding.    6. Moderate asthma without complication, unspecified whether persistent No wheezing today - mometasone-formoterol (DULERA) 100-5 MCG/ACT AERO; Inhale 2 puffs into the lungs 2 (two) times daily.  Dispense: 8.8 g; Refill: 2 - albuterol (VENTOLIN HFA) 108 (90 Base) MCG/ACT inhaler; Inhale 2 puffs into the lungs every 6 (six) hours as needed for wheezing or shortness of breath.  Dispense: 6.7 g; Refill: 2  7. Hypertensive urgency Improving;  Has not had meds today. Take meds - carvedilol (COREG) 3.125 MG tablet; Take 1 tablet (3.125 mg total) by mouth 2 (two) times daily with a meal.  Dispense: 60 tablet; Refill: 5 - losartan (COZAAR) 50 MG tablet; Take 1 tablet (50 mg total) by mouth daily.  Dispense: 30 tablet; Refill: 5 - CBC with Differential/Platelet - Comprehensive metabolic panel  8. Leukocytosis, unspecified type - CBC with Differential/Platelet   Patient  have been counseled extensively about nutrition and exercise  Return in about 6 weeks (around 09/12/2020) for assign PCP.  The patient was given clear instructions to go to ER or return to medical center if symptoms don't improve, worsen or new problems develop. The patient verbalized understanding. The patient was told to call to get lab results if they haven't heard anything in the next week.     Georgian Co, PA-C [facility] [place], [place] [phone]   08/01/2020, 3:56 PM

## 2020-08-01 ENCOUNTER — Encounter: Payer: Self-pay | Admitting: Physician Assistant

## 2020-08-01 ENCOUNTER — Other Ambulatory Visit: Payer: Self-pay

## 2020-08-01 ENCOUNTER — Ambulatory Visit: Payer: Self-pay | Attending: Physician Assistant | Admitting: Physician Assistant

## 2020-08-01 VITALS — BP 149/80 | HR 95 | Resp 16 | Ht 75.0 in | Wt 372.4 lb

## 2020-08-01 DIAGNOSIS — Z0189 Encounter for other specified special examinations: Secondary | ICD-10-CM

## 2020-08-01 DIAGNOSIS — E662 Morbid (severe) obesity with alveolar hypoventilation: Secondary | ICD-10-CM

## 2020-08-01 DIAGNOSIS — D72829 Elevated white blood cell count, unspecified: Secondary | ICD-10-CM

## 2020-08-01 DIAGNOSIS — I160 Hypertensive urgency: Secondary | ICD-10-CM

## 2020-08-01 DIAGNOSIS — I872 Venous insufficiency (chronic) (peripheral): Secondary | ICD-10-CM

## 2020-08-01 DIAGNOSIS — E785 Hyperlipidemia, unspecified: Secondary | ICD-10-CM

## 2020-08-01 DIAGNOSIS — J45909 Unspecified asthma, uncomplicated: Secondary | ICD-10-CM

## 2020-08-01 DIAGNOSIS — I5031 Acute diastolic (congestive) heart failure: Secondary | ICD-10-CM

## 2020-08-01 MED ORDER — FUROSEMIDE 40 MG PO TABS: 40.0000 mg | ORAL_TABLET | Freq: Two times a day (BID) | ORAL | 5 refills | Status: DC

## 2020-08-01 MED ORDER — HYDRALAZINE HCL 50 MG PO TABS: 50.0000 mg | ORAL_TABLET | Freq: Two times a day (BID) | ORAL | 1 refills | Status: DC

## 2020-08-01 MED ORDER — ATORVASTATIN CALCIUM 40 MG PO TABS: 40.0000 mg | ORAL_TABLET | Freq: Every day | ORAL | 5 refills | Status: DC

## 2020-08-01 MED ORDER — LOSARTAN POTASSIUM 50 MG PO TABS: 50.0000 mg | ORAL_TABLET | Freq: Every day | ORAL | 5 refills | Status: DC

## 2020-08-01 MED ORDER — HYDRALAZINE HCL 50 MG PO TABS: 50.0000 mg | ORAL_TABLET | Freq: Two times a day (BID) | ORAL | 2 refills | Status: DC

## 2020-08-01 MED ORDER — ASPIRIN 81 MG PO TBEC: 81.0000 mg | DELAYED_RELEASE_TABLET | Freq: Every day | ORAL | 1 refills | Status: DC

## 2020-08-01 MED ORDER — MOMETASONE FURO-FORMOTEROL FUM 100-5 MCG/ACT IN AERO: 2.0000 | INHALATION_SPRAY | Freq: Two times a day (BID) | RESPIRATORY_TRACT | 2 refills | Status: DC

## 2020-08-01 MED ORDER — ALBUTEROL SULFATE HFA 108 (90 BASE) MCG/ACT IN AERS: 2.0000 | INHALATION_SPRAY | Freq: Four times a day (QID) | RESPIRATORY_TRACT | 2 refills | Status: DC | PRN

## 2020-08-01 MED ORDER — CARVEDILOL 3.125 MG PO TABS: 3.1250 mg | ORAL_TABLET | Freq: Two times a day (BID) | ORAL | 5 refills | Status: DC

## 2020-08-01 MED FILL — FUROSEMIDE 40 MG TAB: 40 | 30 days supply | Qty: 60 | Fill #0

## 2020-08-01 MED FILL — CARVEDILOL 3.125 MG TABLET: 3.125 | 30 days supply | Qty: 60 | Fill #0

## 2020-08-01 MED FILL — hydrALAZINE HCL 50 MG TABS: 50 | 30 days supply | Qty: 60 | Fill #0

## 2020-08-01 MED FILL — LOSARTAN POTASSIUM 50 MG TA: 50 | 30 days supply | Qty: 30 | Fill #0

## 2020-08-01 MED FILL — DULERA 100 MCG/5 MCG INH: 100-5 | 30 days supply | Qty: 1 | Fill #0

## 2020-08-01 MED FILL — ATORVASTATIN CALCIUM 40 MG: 40 | 30 days supply | Qty: 30 | Fill #0

## 2020-08-02 LAB — COMPREHENSIVE METABOLIC PANEL
ALT: 24 IU/L (ref 0–44)
AST: 20 IU/L (ref 0–40)
Albumin/Globulin Ratio: 1.6 (ref 1.2–2.2)
Albumin: 4.4 g/dL (ref 4.0–5.0)
Alkaline Phosphatase: 76 IU/L (ref 48–121)
BUN/Creatinine Ratio: 15 (ref 9–20)
BUN: 14 mg/dL (ref 6–24)
Bilirubin Total: 0.9 mg/dL (ref 0.0–1.2)
CO2: 32 mmol/L — ABNORMAL HIGH (ref 20–29)
Calcium: 9.5 mg/dL (ref 8.7–10.2)
Chloride: 99 mmol/L (ref 96–106)
Creatinine, Ser: 0.96 mg/dL (ref 0.76–1.27)
GFR calc Af Amer: 109 mL/min/{1.73_m2} (ref >59)
GFR calc non Af Amer: 94 mL/min/{1.73_m2} (ref >59)
Globulin, Total: 2.8 g/dL (ref 1.5–4.5)
Glucose: 82 mg/dL (ref 65–99)
Potassium: 4.5 mmol/L (ref 3.5–5.2)
Sodium: 141 mmol/L (ref 134–144)
Total Protein: 7.2 g/dL (ref 6.0–8.5)

## 2020-08-02 LAB — CBC WITH DIFFERENTIAL/PLATELET
Basophils Absolute: 0.1 10*3/uL (ref 0.0–0.2)
Basos: 1 %
EOS (ABSOLUTE): 0.4 10*3/uL (ref 0.0–0.4)
Eos: 3 %
Hematocrit: 49 % (ref 37.5–51.0)
Hemoglobin: 15.4 g/dL (ref 13.0–17.7)
Immature Grans (Abs): 0.1 10*3/uL (ref 0.0–0.1)
Immature Granulocytes: 1 %
Lymphocytes Absolute: 2.8 10*3/uL (ref 0.7–3.1)
Lymphs: 20 %
MCH: 29.2 pg (ref 26.6–33.0)
MCHC: 31.4 g/dL — ABNORMAL LOW (ref 31.5–35.7)
MCV: 93 fL (ref 79–97)
Monocytes Absolute: 1 10*3/uL — ABNORMAL HIGH (ref 0.1–0.9)
Monocytes: 7 %
Neutrophils Absolute: 9.9 10*3/uL — ABNORMAL HIGH (ref 1.4–7.0)
Neutrophils: 68 %
Platelets: 232 10*3/uL (ref 150–450)
RBC: 5.28 x10E6/uL (ref 4.14–5.80)
RDW: 12.2 % (ref 11.6–15.4)
WBC: 14.3 10*3/uL — ABNORMAL HIGH (ref 3.4–10.8)

## 2020-08-02 MED FILL — !PROVENTIL HFA 90 MCG INH: 108 (90 BAS | 25 days supply | Qty: 7 | Fill #0

## 2020-08-08 ENCOUNTER — Other Ambulatory Visit: Payer: Self-pay | Admitting: Physician Assistant

## 2020-08-08 ENCOUNTER — Telehealth: Payer: Self-pay | Admitting: General Practice

## 2020-08-08 ENCOUNTER — Ambulatory Visit: Payer: Self-pay

## 2020-08-08 DIAGNOSIS — D72829 Elevated white blood cell count, unspecified: Secondary | ICD-10-CM

## 2020-08-08 NOTE — Telephone Encounter (Signed)
Please follow up Copied from CRM [id number]. Topic: Appointment Scheduling - Scheduling Inquiry for Clinic >> Aug 08, 2020  1:49 PM [name], [place] wrote: Reason for CRM: Pt was scheduled for a financial counseling appt today at 4 pm but he stated he will not be able to make it. Pt requests that the appt be rescheduled.

## 2020-08-08 NOTE — Telephone Encounter (Signed)
Pt was call and reschedule the appt °

## 2020-08-24 ENCOUNTER — Other Ambulatory Visit: Payer: Self-pay

## 2020-08-24 ENCOUNTER — Ambulatory Visit: Payer: Self-pay | Attending: Family Medicine

## 2020-08-24 MED FILL — LOSARTAN POTASSIUM 50 MG TA: 50 | 30 days supply | Qty: 30 | Fill #0

## 2020-08-24 MED FILL — ATORVASTATIN CALCIUM 40 MG: 40 | 30 days supply | Qty: 30 | Fill #0

## 2020-08-24 MED FILL — hydrALAZINE HCL 50 MG TABS: 50 | 30 days supply | Qty: 60 | Fill #0

## 2020-08-24 MED FILL — CARVEDILOL 3.125 MG TABLET: 3.125 | 30 days supply | Qty: 60 | Fill #0

## 2020-08-24 MED FILL — FUROSEMIDE 40 MG TAB: 40 | 30 days supply | Qty: 60 | Fill #0

## 2020-09-20 ENCOUNTER — Ambulatory Visit: Payer: Self-pay | Admitting: Cardiovascular Disease

## 2020-10-04 ENCOUNTER — Ambulatory Visit: Payer: Self-pay | Admitting: Cardiology

## 2020-10-16 ENCOUNTER — Inpatient Hospital Stay (HOSPITAL_COMMUNITY)
Admission: EM | Admit: 2020-10-16 | Discharge: 2020-10-25 | DRG: 291 | Disposition: A | Discharge status: Home / Self Care | Payer: Medicaid Other | Attending: Internal Medicine | Admitting: Internal Medicine

## 2020-10-16 ENCOUNTER — Encounter (HOSPITAL_COMMUNITY): Payer: Self-pay

## 2020-10-16 ENCOUNTER — Emergency Department (HOSPITAL_COMMUNITY): Payer: Medicaid Other

## 2020-10-16 ENCOUNTER — Other Ambulatory Visit: Payer: Self-pay

## 2020-10-16 DIAGNOSIS — I110 Hypertensive heart disease with heart failure: Principal | ICD-10-CM | POA: Diagnosis present

## 2020-10-16 DIAGNOSIS — G4733 Obstructive sleep apnea (adult) (pediatric): Secondary | ICD-10-CM

## 2020-10-16 DIAGNOSIS — J9622 Acute and chronic respiratory failure with hypercapnia: Secondary | ICD-10-CM | POA: Diagnosis present

## 2020-10-16 DIAGNOSIS — R0602 Shortness of breath: Secondary | ICD-10-CM

## 2020-10-16 DIAGNOSIS — Z8249 Family history of ischemic heart disease and other diseases of the circulatory system: Secondary | ICD-10-CM

## 2020-10-16 DIAGNOSIS — J45909 Unspecified asthma, uncomplicated: Secondary | ICD-10-CM | POA: Diagnosis present

## 2020-10-16 DIAGNOSIS — I5032 Chronic diastolic (congestive) heart failure: Secondary | ICD-10-CM | POA: Diagnosis present

## 2020-10-16 DIAGNOSIS — Z6841 Body Mass Index (BMI) 40.0 and over, adult: Secondary | ICD-10-CM

## 2020-10-16 DIAGNOSIS — K449 Diaphragmatic hernia without obstruction or gangrene: Secondary | ICD-10-CM | POA: Diagnosis present

## 2020-10-16 DIAGNOSIS — J9612 Chronic respiratory failure with hypercapnia: Secondary | ICD-10-CM | POA: Diagnosis present

## 2020-10-16 DIAGNOSIS — E662 Morbid (severe) obesity with alveolar hypoventilation: Secondary | ICD-10-CM | POA: Diagnosis present

## 2020-10-16 DIAGNOSIS — J9601 Acute respiratory failure with hypoxia: Secondary | ICD-10-CM

## 2020-10-16 DIAGNOSIS — I10 Essential (primary) hypertension: Secondary | ICD-10-CM | POA: Diagnosis present

## 2020-10-16 DIAGNOSIS — I5033 Acute on chronic diastolic (congestive) heart failure: Secondary | ICD-10-CM | POA: Diagnosis present

## 2020-10-16 DIAGNOSIS — R4182 Altered mental status, unspecified: Secondary | ICD-10-CM | POA: Diagnosis not present

## 2020-10-16 DIAGNOSIS — Z713 Dietary counseling and surveillance: Secondary | ICD-10-CM

## 2020-10-16 DIAGNOSIS — Z9981 Dependence on supplemental oxygen: Secondary | ICD-10-CM

## 2020-10-16 DIAGNOSIS — J9621 Acute and chronic respiratory failure with hypoxia: Secondary | ICD-10-CM | POA: Diagnosis not present

## 2020-10-16 DIAGNOSIS — Z7951 Long term (current) use of inhaled steroids: Secondary | ICD-10-CM

## 2020-10-16 DIAGNOSIS — J9602 Acute respiratory failure with hypercapnia: Secondary | ICD-10-CM

## 2020-10-16 DIAGNOSIS — E875 Hyperkalemia: Secondary | ICD-10-CM | POA: Diagnosis present

## 2020-10-16 DIAGNOSIS — I872 Venous insufficiency (chronic) (peripheral): Secondary | ICD-10-CM

## 2020-10-16 DIAGNOSIS — I5031 Acute diastolic (congestive) heart failure: Secondary | ICD-10-CM

## 2020-10-16 DIAGNOSIS — R279 Unspecified lack of coordination: Secondary | ICD-10-CM | POA: Diagnosis not present

## 2020-10-16 DIAGNOSIS — E785 Hyperlipidemia, unspecified: Secondary | ICD-10-CM | POA: Diagnosis present

## 2020-10-16 DIAGNOSIS — Z20822 Contact with and (suspected) exposure to covid-19: Secondary | ICD-10-CM | POA: Diagnosis present

## 2020-10-16 DIAGNOSIS — Z720 Tobacco use: Secondary | ICD-10-CM

## 2020-10-16 DIAGNOSIS — I160 Hypertensive urgency: Secondary | ICD-10-CM

## 2020-10-16 DIAGNOSIS — Z7982 Long term (current) use of aspirin: Secondary | ICD-10-CM

## 2020-10-16 DIAGNOSIS — D72829 Elevated white blood cell count, unspecified: Secondary | ICD-10-CM | POA: Diagnosis present

## 2020-10-16 DIAGNOSIS — J9611 Chronic respiratory failure with hypoxia: Secondary | ICD-10-CM | POA: Diagnosis present

## 2020-10-16 DIAGNOSIS — Z79899 Other long term (current) drug therapy: Secondary | ICD-10-CM

## 2020-10-16 DIAGNOSIS — Z9111 Patient's noncompliance with dietary regimen: Secondary | ICD-10-CM

## 2020-10-16 DIAGNOSIS — J449 Chronic obstructive pulmonary disease, unspecified: Secondary | ICD-10-CM | POA: Diagnosis present

## 2020-10-16 DIAGNOSIS — F17210 Nicotine dependence, cigarettes, uncomplicated: Secondary | ICD-10-CM | POA: Diagnosis present

## 2020-10-16 DIAGNOSIS — Z716 Tobacco abuse counseling: Secondary | ICD-10-CM

## 2020-10-16 HISTORY — DX: Obesity, unspecified: E66.9

## 2020-10-16 LAB — BRAIN NATRIURETIC PEPTIDE: B Natriuretic Peptide: 225.1 pg/mL — ABNORMAL HIGH (ref 0.0–100.0)

## 2020-10-16 LAB — CBC WITH DIFFERENTIAL/PLATELET
Abs Immature Granulocytes: 0.24 10*3/uL — ABNORMAL HIGH (ref 0.00–0.07)
Basophils Absolute: 0.1 10*3/uL (ref 0.0–0.1)
Basophils Relative: 1 %
Eosinophils Absolute: 0.3 10*3/uL (ref 0.0–0.5)
Eosinophils Relative: 1 %
HCT: 52.2 % — ABNORMAL HIGH (ref 39.0–52.0)
Hemoglobin: 15.6 g/dL (ref 13.0–17.0)
Immature Granulocytes: 1 %
Lymphocytes Relative: 15 %
Lymphs Abs: 3.2 10*3/uL (ref 0.7–4.0)
MCH: 30.1 pg (ref 26.0–34.0)
MCHC: 29.9 g/dL — ABNORMAL LOW (ref 30.0–36.0)
MCV: 100.6 fL — ABNORMAL HIGH (ref 80.0–100.0)
Monocytes Absolute: 1.6 10*3/uL — ABNORMAL HIGH (ref 0.1–1.0)
Monocytes Relative: 8 %
Neutro Abs: 15.6 10*3/uL — ABNORMAL HIGH (ref 1.7–7.7)
Neutrophils Relative %: 74 %
Platelets: 304 10*3/uL (ref 150–400)
RBC: 5.19 MIL/uL (ref 4.22–5.81)
RDW: 14 % (ref 11.5–15.5)
WBC: 21 10*3/uL — ABNORMAL HIGH (ref 4.0–10.5)
nRBC: 0.5 % — ABNORMAL HIGH (ref 0.0–0.2)

## 2020-10-16 LAB — COMPREHENSIVE METABOLIC PANEL
ALT: 39 U/L (ref 0–44)
AST: 25 U/L (ref 15–41)
Albumin: 3.6 g/dL (ref 3.5–5.0)
Alkaline Phosphatase: 57 U/L (ref 38–126)
Anion gap: 9 (ref 5–15)
BUN: 27 mg/dL — ABNORMAL HIGH (ref 6–20)
CO2: 36 mmol/L — ABNORMAL HIGH (ref 22–32)
Calcium: 8.8 mg/dL — ABNORMAL LOW (ref 8.9–10.3)
Chloride: 96 mmol/L — ABNORMAL LOW (ref 98–111)
Creatinine, Ser: 1.15 mg/dL (ref 0.61–1.24)
GFR, Estimated: >60 mL/min (ref >60)
Glucose, Bld: 120 mg/dL — ABNORMAL HIGH (ref 70–99)
Potassium: 4.7 mmol/L (ref 3.5–5.1)
Sodium: 141 mmol/L (ref 135–145)
Total Bilirubin: 1.8 mg/dL — ABNORMAL HIGH (ref 0.3–1.2)
Total Protein: 7.4 g/dL (ref 6.5–8.1)

## 2020-10-16 LAB — RESPIRATORY PANEL BY RT PCR (FLU A&B, COVID)
Influenza A by PCR: NEGATIVE
Influenza B by PCR: NEGATIVE
SARS Coronavirus 2 by RT PCR: NEGATIVE

## 2020-10-16 LAB — RAPID URINE DRUG SCREEN, HOSP PERFORMED
Amphetamines: NOT DETECTED
Barbiturates: NOT DETECTED
Benzodiazepines: NOT DETECTED
Cocaine: NOT DETECTED
Opiates: NOT DETECTED
Tetrahydrocannabinol: POSITIVE — AB

## 2020-10-16 LAB — TROPONIN I (HIGH SENSITIVITY)
Troponin I (High Sensitivity): 54 ng/L — ABNORMAL HIGH (ref <18)
Troponin I (High Sensitivity): 52 ng/L — ABNORMAL HIGH (ref <18)

## 2020-10-16 MED ORDER — ALBUTEROL SULFATE HFA 108 (90 BASE) MCG/ACT IN AERS: 2.0000 | INHALATION_SPRAY | Freq: Four times a day (QID) | RESPIRATORY_TRACT | Status: DC | PRN

## 2020-10-16 MED ORDER — FUROSEMIDE 10 MG/ML IJ SOLN
40.0000 mg | Freq: Two times a day (BID) | INTRAMUSCULAR | Status: DC
  Administered 2020-10-17 – 2020-10-25 (×18): 40 mg via INTRAVENOUS
  Filled 2020-10-16 (×18): qty 4

## 2020-10-16 MED ORDER — ENOXAPARIN SODIUM 40 MG/0.4ML ~~LOC~~ SOLN
40.0000 mg | SUBCUTANEOUS | Status: DC
  Administered 2020-10-17: 40 mg via SUBCUTANEOUS
  Filled 2020-10-16: qty 0.4

## 2020-10-16 MED ORDER — ONDANSETRON HCL 4 MG/2ML IJ SOLN: 4.0000 mg | Freq: Four times a day (QID) | INTRAMUSCULAR | Status: DC | PRN

## 2020-10-16 MED ORDER — HYDRALAZINE HCL 50 MG PO TABS
50.0000 mg | ORAL_TABLET | Freq: Two times a day (BID) | ORAL | Status: DC
  Administered 2020-10-17 – 2020-10-25 (×15): 50 mg via ORAL
  Filled 2020-10-16 (×8): qty 1
  Filled 2020-10-16: qty 2
  Filled 2020-10-16 (×8): qty 1

## 2020-10-16 MED ORDER — CARVEDILOL 3.125 MG PO TABS
3.1250 mg | ORAL_TABLET | Freq: Two times a day (BID) | ORAL | Status: DC
  Administered 2020-10-17 – 2020-10-18 (×3): 3.125 mg via ORAL
  Filled 2020-10-16 (×3): qty 1

## 2020-10-16 MED ORDER — ATORVASTATIN CALCIUM 40 MG PO TABS
40.0000 mg | ORAL_TABLET | Freq: Every day | ORAL | Status: DC
  Administered 2020-10-18 – 2020-10-25 (×8): 40 mg via ORAL
  Filled 2020-10-16 (×8): qty 1

## 2020-10-16 MED ORDER — LOSARTAN POTASSIUM 50 MG PO TABS
50.0000 mg | ORAL_TABLET | Freq: Every day | ORAL | Status: DC
  Administered 2020-10-17 – 2020-10-18 (×2): 50 mg via ORAL
  Filled 2020-10-16 (×2): qty 1

## 2020-10-16 MED ORDER — MOMETASONE FURO-FORMOTEROL FUM 100-5 MCG/ACT IN AERO
2.0000 | INHALATION_SPRAY | Freq: Two times a day (BID) | RESPIRATORY_TRACT | Status: DC
  Administered 2020-10-17 – 2020-10-25 (×15): 2 via RESPIRATORY_TRACT
  Filled 2020-10-16 (×2): qty 8.8

## 2020-10-16 MED ORDER — IOHEXOL 350 MG/ML SOLN
100.0000 mL | Freq: Once | INTRAVENOUS | Status: AC | PRN
  Administered 2020-10-16: 100 mL via INTRAVENOUS

## 2020-10-16 MED ORDER — ONDANSETRON HCL 4 MG PO TABS: 4.0000 mg | ORAL_TABLET | Freq: Four times a day (QID) | ORAL | Status: DC | PRN

## 2020-10-16 MED ORDER — SODIUM CHLORIDE (PF) 0.9 % IJ SOLN
INTRAMUSCULAR | Status: AC
  Filled 2020-10-16: qty 50

## 2020-10-16 MED ORDER — ACETAMINOPHEN 650 MG RE SUPP: 650.0000 mg | Freq: Four times a day (QID) | RECTAL | Status: DC | PRN

## 2020-10-16 MED ORDER — ACETAMINOPHEN 325 MG PO TABS: 650.0000 mg | ORAL_TABLET | Freq: Four times a day (QID) | ORAL | Status: DC | PRN

## 2020-10-16 MED ORDER — SODIUM CHLORIDE 0.9% FLUSH
3.0000 mL | Freq: Two times a day (BID) | INTRAVENOUS | Status: DC
  Administered 2020-10-16 – 2020-10-25 (×18): 3 mL via INTRAVENOUS

## 2020-10-16 NOTE — ED Notes (Signed)
Patient continues to removed oxygen despite being educated on the importance of keeping it on. Patient needed to have a bowel movement and would not let the staff get the bedside commonde next to the bed, stating he was about to "shit his pants."  Extension put on oxygen and placed back on the patient.  Patient insisted that staff leaving the room because he cannot go to the bathroom with [place] in the room with him.  Call bell given to patient and he was made aware to push red button and to not get up alone.  Sch;ossman, EDP at bedside as well.

## 2020-10-16 NOTE — ED Provider Notes (Signed)
[facility] EMERGENCY DEPARTMENT Provider Note  CSN: [id number] Arrival date & time: 10/16/20 1257    History Chief Complaint  Patient presents with  . Shortness of Breath  . Leg Swelling    HPI  Joseph Morales is a 49 y.o. male with history of asthma and CHF reports about a week of increasing SOB, DOE and orthopnea, associated with leg swelling and not improved with his usual diuretics and inhalers. He has had decreased urine output. Denies CP, fever or cough.    Past Medical History:  Diagnosis Date  . Asthma   . CHF (congestive heart failure) (HCC)   . Obesity     Past Surgical History:  Procedure Laterality Date  . NO PAST SURGERIES      Family History  Problem Relation Age of Onset  . Breast cancer Mother   . Diabetes Mother   . Emphysema Maternal Grandmother   . Congestive Heart Failure Maternal Grandmother     Social History   Tobacco Use  . Smoking status: Former Smoker    Packs/day: 0.50    Years: 20.00    Pack years: 10.00    Types: Cigarettes  . Smokeless tobacco: Never Used  Vaping Use  . Vaping Use: Never used  Substance Use Topics  . Alcohol use: Never  . Drug use: Yes    Types: Marijuana    Comment: occasional marijuana     Home Medications Prior to Admission medications   Medication Sig Start Date End Date Taking? Authorizing Provider  albuterol (VENTOLIN HFA) 108 (90 Base) MCG/ACT inhaler Inhale 2 puffs into the lungs every 6 (six) hours as needed for wheezing or shortness of breath. 08/01/20   Anders Simmonds, PA-C  aspirin 81 MG EC tablet Take 1 tablet (81 mg total) by mouth daily. Swallow whole. 08/01/20   Anders Simmonds, PA-C  atorvastatin (LIPITOR) 40 MG tablet Take 1 tablet (40 mg total) by mouth daily. 08/01/20 08/31/20  Anders Simmonds, PA-C  carvedilol (COREG) 3.125 MG tablet Take 1 tablet (3.125 mg total) by mouth 2 (two) times daily with a meal. 08/01/20 08/31/20  McClung, Marzella Schlein, PA-C  CINNAMON PO Take 1 capsule by mouth  daily.    [provider]  furosemide (LASIX) 40 MG tablet Take 1 tablet (40 mg total) by mouth 2 (two) times daily. 08/01/20 08/31/20  Anders Simmonds, PA-C  hydrALAZINE (APRESOLINE) 50 MG tablet Take 1 tablet (50 mg total) by mouth in the morning and at bedtime. 08/01/20 08/31/20  Anders Simmonds, PA-C  lisinopril (ZESTRIL) 10 MG tablet Take by mouth. 08/15/19   [provider]  losartan (COZAAR) 50 MG tablet Take 1 tablet (50 mg total) by mouth daily. 08/01/20 08/31/20  Anders Simmonds, PA-C  mometasone-formoterol (DULERA) 100-5 MCG/ACT AERO Inhale 2 puffs into the lungs 2 (two) times daily. 08/01/20   Anders Simmonds, PA-C  nicotine (NICODERM CQ - DOSED IN MG/24 HOURS) 21 mg/24hr patch Place 1 patch (21 mg total) onto the skin daily. 06/27/20   Briant Cedar, MD     Allergies    Patient has no known allergies.   Review of Systems   Review of Systems A comprehensive review of systems was completed and negative except as noted in HPI.    Physical Exam BP (!) 163/93   Pulse (!) 103   Temp 98.3 F (36.8 C) (Oral)   Resp (!) 29   Ht 6\' 3"  (1.905 m)   Wt )  171.5 kg   SpO2 91%   BMI 47.25 kg/m   Physical Exam Vitals and nursing note reviewed.  Constitutional:      Appearance: Normal appearance.  HENT:     Head: Normocephalic and atraumatic.     Nose: Nose normal.     Mouth/Throat:     Mouth: Mucous membranes are moist.  Eyes:     Extraocular Movements: Extraocular movements intact.     Conjunctiva/sclera: Conjunctivae normal.  Cardiovascular:     Rate and Rhythm: Normal rate.  Pulmonary:     Effort: Pulmonary effort is normal.     Comments: Decreased air movement, faint distant breath sounds, mild end expiratory wheeze, no rales Abdominal:     General: Abdomen is flat.     Palpations: Abdomen is soft.     Tenderness: There is no abdominal tenderness.  Musculoskeletal:        General: No swelling. Normal range of motion.     Cervical back: Neck  supple.     Comments: 2+ edema BLE  Skin:    General: Skin is warm and dry.  Neurological:     General: No focal deficit present.     Mental Status: He is alert.  Psychiatric:        Mood and Affect: Mood normal.      ED Results / Procedures / Treatments   Labs (all labs ordered are listed, but only abnormal results are displayed) Labs Reviewed  COMPREHENSIVE METABOLIC PANEL - Abnormal; Notable for the following components:      Result Value   Chloride 96 (*)    CO2 36 (*)    Glucose, Bld 120 (*)    BUN 27 (*)    Calcium 8.8 (*)    Total Bilirubin 1.8 (*)    All other components within normal limits  BRAIN NATRIURETIC PEPTIDE - Abnormal; Notable for the following components:   B Natriuretic Peptide 225.1 (*)    All other components within normal limits  CBC WITH DIFFERENTIAL/PLATELET - Abnormal; Notable for the following components:   WBC 21.0 (*)    HCT 52.2 (*)    MCV 100.6 (*)    MCHC 29.9 (*)    nRBC 0.5 (*)    Neutro Abs 15.6 (*)    Monocytes Absolute 1.6 (*)    Abs Immature Granulocytes 0.24 (*)    All other components within normal limits  TROPONIN I (HIGH SENSITIVITY) - Abnormal; Notable for the following components:   Troponin I (High Sensitivity) 54 (*)    All other components within normal limits  RESPIRATORY PANEL BY RT PCR (FLU A&B, COVID)  TROPONIN I (HIGH SENSITIVITY)    EKG EKG Interpretation  Date/Time:  Tuesday October 16 2020 14:01:05 EDT Ventricular Rate:  100 PR Interval:    QRS Duration: 99 QT Interval:  338 QTC Calculation: 436 R Axis:   101 Text Interpretation: Sinus tachycardia Right axis deviation Borderline T wave abnormalities No significant change since last tracing EARLIER SAME DATE Confirmed by Susy Frizzle [phone]) on 10/16/2020 2:13:54 PM   Radiology DG Chest Port 1 View  Result Date: 10/16/2020 CLINICAL DATA:  Shortness of breath and leg swelling EXAM: PORTABLE CHEST 1 VIEW COMPARISON:  06/21/2020 FINDINGS: Cardiac  shadow is stable. The lungs are well aerated bilaterally. No focal infiltrate or sizable effusion is seen. No bony abnormality is noted IMPRESSION: No acute abnormality noted. Electronically Signed   By: Alcide Clever M.D.   On: 10/16/2020 15:10  Procedures Procedures  Medications Ordered in the ED Medications - No data to display   MDM Rules/Calculators/A&P MDM Patient with marked hypoxia in Triage, improved with 5L [place] and now titrated back to 2L. Symptoms concerning for CHF vs Covid, less likely asthma exacerbation as he is improved with oxygen and rest alone. Will check labs and CXR. ED Course  I have reviewed the triage vital signs and the nursing notes.  Pertinent labs & imaging results that were available during my care of the patient were reviewed by me and considered in my medical decision making (see chart for details).  Clinical Course as of Oct 16 1612  Tue Oct 16, 2020  1506 CBC shows leukocytosis.    [CS]  1531 Trop mildly elevated, CMP unremarkable. CXR is clear. Consider PE or occult PNA as a cause. Will send for CTA chest.    [CS]  1548 BNP not significantly elevated.    [CS]  1607 Care of the patient will be signed out to Dr. Dalene Seltzer at the change of shift pending CTA.    [CS]    Clinical Course User Index [CS] Pollyann Savoy, MD    Final Clinical Impression(s) / ED Diagnoses Final diagnoses:  SOB (shortness of breath)    Rx / DC Orders ED Discharge Orders    None       Pollyann Savoy, MD 10/16/20 [phone]

## 2020-10-16 NOTE — ED Notes (Signed)
Wa;le

## 2020-10-16 NOTE — ED Triage Notes (Signed)
Patient c/o SOB and leg swelling. patient states he has had symptoms x a few months which have gradually worsened.  Room air sats 73%. Patient placed on O2 5L/min and sats increased to 94%.

## 2020-10-16 NOTE — ED Notes (Signed)
Patient transported to CT 

## 2020-10-16 NOTE — ED Provider Notes (Signed)
  Physical Exam  BP (!) 167/83   Pulse (!) 105   Temp 98.3 F (36.8 C) (Oral)   Resp 13   Ht 6\' 3"  (1.905 m)   Wt (!) 171.5 kg   SpO2 97%   BMI 47.25 kg/m   Physical Exam  ED Course/Procedures   Clinical Course as of Oct 16 1825  Tue Oct 16, 2020  1506 CBC shows leukocytosis.    [CS]  1531 Trop mildly elevated, CMP unremarkable. CXR is clear. Consider PE or occult PNA as a cause. Will send for CTA chest.    [CS]  1548 BNP not significantly elevated.    [CS]  1607 Care of the patient will be signed out to Dr. Oct 18, 2020 at the change of shift pending CTA.    [CS]    Clinical Course User Index [CS] Dalene Seltzer, MD    Procedures  MDM    Received care of patient from Dr. Pollyann Savoy.  Please see his note for prior history, physical and care.  Briefly this is a 49 year old male who presented with concern for shortness of breath.  CT PE study is currently pending.  CT PE study shows no evidence of PE, no sign of acute pulmonary abnormalities with the exception of minor mild findings of bronchitis.  COVID-19 testing negative.  Patient has severe hypoxia requiring 5 L nasal cannula.  At this time, unclear etiology of his severe hypoxia--suspect acute on chronic respiratory failure secondary to obesity hypoventilation syndrome and volume overload.  Will admit to the hospitalist for further care.    57, MD 10/17/20 [phone]

## 2020-10-16 NOTE — H&P (Signed)
History and Physical    Joseph Morales HER:[id number] DOB: 01-08-1971 DOA: 10/16/2020  PCP: Patient, No Pcp Per  Patient coming from: Home  I have personally briefly reviewed patient's old medical records in [place]  Chief Complaint: Shortness of breath, leg swelling  HPI: Joseph Morales is a 49 y.o. male with medical history significant for chronic diastolic CHF (EF 85-63% By TTE 06/22/2020), asthma, HTN, HLD, tobacco use, and suspected OHS who presents to the ED for evaluation of progressive dyspnea and lower extremity swelling.  Patient previously hospitalized 06/21/2020-06/26/2020 for hypoxia presumed multifactorial due to diastolic CHF and OHS with possible obstructive lung disease contributing.  He was diuresed and was requiring 2 L supplemental O2 via [place] on discharge.  Patient states shortly after discharge he was adhering to dietary recommendations and was feeling well however admits over the last couple months he has done poorly regarding lifestyle and dietary modifications.  He has had progressive swelling of his lower extremities and dyspnea on exertion. This past week he has become easily fatigued and short of breath with minimal activity. He says he is now having to sleep with his upper half of his body straight up. He says he is waking up short of breath in the middle the night. He says he does snore and has significant daytime fatigue and occasional headaches. He he says his symptoms and swelling is worse than when presented to the hospital back in June. He thinks he has gained about 30 lbs since last time he was in the hospital. He says he has been taking his Lasix and other cardiac meds regularly.  He says he does use 2 L supplemental oxygen at night. He has not had a formal sleep study completed. He reports smoking tobacco, 1 pack a day, but stopped 2 weeks ago.  ED Course:  Initial vitals showed BP 151/79, pulse 101, RR 22, temp 98.3 Fahrenheit, SPO2 75% on room air.   Patient subsequently placed on 6 L O2 via [place] with improved SPO2 >95%.  Labs are notable for WBC 21.0, hemoglobin 15.6, platelets 304,000, sodium 141, potassium 4.7, bicarb 36, BUN 27, creatinine 1.15, serum glucose 120, AST 25, ALT 39, alk phos 57, total bilirubin 1.8, BNP 225.1, high-sensitivity troponin I 54 > 52.  SARS-CoV-2 PCR is negative.  Influenza A/B PCR negative.  Portable chest x-ray was negative for focal consolidation, edema, or effusion.  CTA chest PE study was negative for acute PE.  Airway inflammation with mild bronchial wall thickening is noted.  The hospitalist service was consulted to admit for further evaluation and management.  Review of Systems: All systems reviewed and are negative except as documented in history of present illness above.   Past Medical History:  Diagnosis Date  . Asthma   . CHF (congestive heart failure) ([place])   . Obesity     Past Surgical History:  Procedure Laterality Date  . NO PAST SURGERIES      Social History:  reports that he has quit smoking. His smoking use included cigarettes. He has a 10.00 pack-year smoking history. He has never used smokeless tobacco. He reports current drug use. Drug: Marijuana. He reports that he does not drink alcohol.  No Known Allergies  Family History  Problem Relation Age of Onset  . Breast cancer Mother   . Diabetes Mother   . Emphysema Maternal Grandmother   . Congestive Heart Failure Maternal Grandmother      Prior to Admission medications   Medication Sig  Start Date End Date Taking? Authorizing Provider  albuterol (VENTOLIN HFA) 108 (90 Base) MCG/ACT inhaler Inhale 2 puffs into the lungs every 6 (six) hours as needed for wheezing or shortness of breath. 08/01/20   Argentina Donovan, PA-C  aspirin 81 MG EC tablet Take 1 tablet (81 mg total) by mouth daily. Swallow whole. 08/01/20   Argentina Donovan, PA-C  atorvastatin (LIPITOR) 40 MG tablet Take 1 tablet (40 mg total) by mouth daily. 08/01/20  08/31/20  Argentina Donovan, PA-C  carvedilol (COREG) 3.125 MG tablet Take 1 tablet (3.125 mg total) by mouth 2 (two) times daily with a meal. 08/01/20 08/31/20  McClung, Dionne Bucy, PA-C  CINNAMON PO Take 1 capsule by mouth daily.    [provider]  furosemide (LASIX) 40 MG tablet Take 1 tablet (40 mg total) by mouth 2 (two) times daily. 08/01/20 08/31/20  Argentina Donovan, PA-C  hydrALAZINE (APRESOLINE) 50 MG tablet Take 1 tablet (50 mg total) by mouth in the morning and at bedtime. 08/01/20 08/31/20  Argentina Donovan, PA-C  lisinopril (ZESTRIL) 10 MG tablet Take by mouth. 08/15/19   [provider]  losartan (COZAAR) 50 MG tablet Take 1 tablet (50 mg total) by mouth daily. 08/01/20 08/31/20  Argentina Donovan, PA-C  mometasone-formoterol (DULERA) 100-5 MCG/ACT AERO Inhale 2 puffs into the lungs 2 (two) times daily. 08/01/20   Argentina Donovan, PA-C  nicotine (NICODERM CQ - DOSED IN MG/24 HOURS) 21 mg/24hr patch Place 1 patch (21 mg total) onto the skin daily. 06/27/20   Alma Friendly, MD    Physical Exam: Vitals:   10/16/20 1725 10/16/20 1726 10/16/20 1750 10/16/20 1811  BP:  (!) 174/78 (!) 167/83   Pulse: 98 98 (!) 105   Resp: 20 (!) 28 13   Temp:      TempSrc:      SpO2: 96% 97% (!) 82% 97%  Weight:      Height:       Constitutional: Obese man resting in bed with head elevated, NAD, calm, comfortable Eyes: PERRL, lids and conjunctivae normal ENMT: Mucous membranes are moist. Posterior pharynx clear of any exudate or lesions.Normal dentition.  Neck: normal, supple, no masses. Respiratory: Distant breath sounds with faint expiratory wheeze. Normal respiratory effort while on 6 L supplemental O2 via [place]. No accessory muscle use.  Cardiovascular: Regular rate and rhythm, no murmurs / rubs / gallops. +2 tight pitting bilateral lower extremity edema past the knees. 2+ pedal pulses. Abdomen: no tenderness, no masses palpated. No hepatosplenomegaly. Bowel sounds positive.    Musculoskeletal: no clubbing / cyanosis. No joint deformity upper and lower extremities. Good ROM, no contractures. Normal muscle tone.  Skin: no rashes, lesions, ulcers. No induration Neurologic: CN 2-12 grossly intact. Sensation intact, Strength 5/5 in all 4.  Psychiatric: Normal judgment and insight. Alert and oriented x 3. Normal mood.   Labs on Admission: I have personally reviewed following labs and imaging studies  CBC: Recent Labs  Lab 10/16/20 1358  WBC 21.0*  NEUTROABS 15.6*  HGB 15.6  HCT 52.2*  MCV 100.6*  PLT 546   Basic Metabolic Panel: Recent Labs  Lab 10/16/20 1358  NA 141  K 4.7  CL 96*  CO2 36*  GLUCOSE 120*  BUN 27*  CREATININE 1.15  CALCIUM 8.8*   GFR: Estimated Creatinine Clearance: 134 mL/min (by C-G formula based on SCr of 1.15 mg/dL). Liver Function Tests: Recent Labs  Lab 10/16/20 1358  AST 25  ALT 39  ALKPHOS 57  BILITOT 1.8*  PROT 7.4  ALBUMIN 3.6   No results for input(s): LIPASE, AMYLASE in the last 168 hours. No results for input(s): AMMONIA in the last 168 hours. Coagulation Profile: No results for input(s): INR, PROTIME in the last 168 hours. Cardiac Enzymes: No results for input(s): CKTOTAL, CKMB, CKMBINDEX, TROPONINI in the last 168 hours. BNP (last 3 results) No results for input(s): PROBNP in the last 8760 hours. HbA1C: No results for input(s): HGBA1C in the last 72 hours. CBG: No results for input(s): GLUCAP in the last 168 hours. Lipid Profile: No results for input(s): CHOL, HDL, LDLCALC, TRIG, CHOLHDL, LDLDIRECT in the last 72 hours. Thyroid Function Tests: No results for input(s): TSH, T4TOTAL, FREET4, T3FREE, THYROIDAB in the last 72 hours. Anemia Panel: No results for input(s): VITAMINB12, FOLATE, FERRITIN, TIBC, IRON, RETICCTPCT in the last 72 hours. Urine analysis: No results found for: COLORURINE, APPEARANCEUR, LABSPEC, PHURINE, GLUCOSEU, HGBUR, BILIRUBINUR, KETONESUR, PROTEINUR, UROBILINOGEN, NITRITE,  LEUKOCYTESUR  Radiological Exams on Admission: CT Angio Chest PE W/Cm &/Or Wo Cm  Result Date: 10/16/2020 CLINICAL DATA:  Asthma, congestive heart failure, increasing dyspnea on exertion, orthopnea EXAM: CT ANGIOGRAPHY CHEST WITH CONTRAST TECHNIQUE: Multidetector CT imaging of the chest was performed using the standard protocol during bolus administration of intravenous contrast. Multiplanar CT image reconstructions and MIPs were obtained to evaluate the vascular anatomy. CONTRAST:  128m OMNIPAQUE IOHEXOL 350 MG/ML SOLN COMPARISON:  None. FINDINGS: Cardiovascular: Satisfactory opacification of the pulmonary arteries to the segmental level. No evidence of pulmonary embolism. Central pulmonary arteries are of normal caliber. No significant coronary artery calcification. Normal heart size. No pericardial effusion. Thoracic aorta is unremarkable. Mediastinum/Nodes: Thyroid unremarkable. No pathologic thoracic adenopathy. Small hiatal hernia. Lungs/Pleura: The lungs are clear. No pneumothorax or pleural effusion. There is mild bronchial wall thickening centrally suggesting mild airway inflammation. No central obstructing mass. Upper Abdomen: Mild nonspecific bilateral perinephric stranding is noted, incompletely assessed on this examination. Otherwise, no acute abnormality. Musculoskeletal: No acute bone abnormality. Review of the MIP images confirms the above findings. IMPRESSION: No acute pulmonary embolism. Airway inflammation with mild bronchial wall thickening. Small hiatal hernia. Nonspecific perinephric stranding within the visualized abdomen. Electronically Signed   By: AFidela SalisburyMD   On: 10/16/2020 16:55   DG Chest Port 1 View  Result Date: 10/16/2020 CLINICAL DATA:  Shortness of breath and leg swelling EXAM: PORTABLE CHEST 1 VIEW COMPARISON:  06/21/2020 FINDINGS: Cardiac shadow is stable. The lungs are well aerated bilaterally. No focal infiltrate or sizable effusion is seen. No bony  abnormality is noted IMPRESSION: No acute abnormality noted. Electronically Signed   By: MInez CatalinaM.D.   On: 10/16/2020 15:10    EKG: Personally reviewed. Sinus tachycardia, rate 102, nonspecific T wave changes inferior leads.  T wave changes new when compared to prior.  Assessment/Plan Principal Problem:   Acute on chronic respiratory failure with hypoxia (HCC) Active Problems:   Asthma   Acute on chronic diastolic CHF (congestive heart failure) (HCC)   Essential hypertension   Hyperlipidemia  BJaythan Hinelyis a 49y.o. male with medical history significant for chronic diastolic CHF (EF 714-97%By TTE 06/22/2020), asthma, HTN, HLD, tobacco use, and suspected OHS who is admitted with acute on chronic respiratory failure with hypoxia.  Acute on chronic respiratory failure with hypoxia Acute on chronic diastolic CHF exacerbation Presumed OSA/OHS: Symptoms seem primarily driven by suspected underlying OSA/OHS likely chronic hypercapnia as evidenced by persistently elevated bicarb. Has not  had formal sleep study. Has significant peripheral edema at time of admission. TTE 06/22/2020 showed EF 70-75%, indeterminate LV diastolic parameters, and inadequate TR signal to assess PA pressure. Patient admits to recent dietary indiscretion. -Continue supplemental O2 and wean as able, currently requiring 6 L O2 via [place] -Trial CPAP nightly -Start IV Lasix 40 mg twice daily -Monitor strict I/O's and daily weights  Asthma: Without acute exacerbation. Continue Dulera and as needed albuterol.  Leukocytosis: Currently no evidence of infection. May be secondary to cigarette smoking. Will send peripheral smear.  Hypertension: Resume home Coreg, losartan, hydralazine.  Hyperlipidemia: Continue atorvastatin.  Tobacco use: Reports usually smoking 1 pack/day, has not smoked the last 2 weeks.  DVT prophylaxis: Lovenox Code Status: Full code, confirmed with patient Family Communication: Discussed with  patient, he has discussed with family Disposition Plan: From home and likely discharge to home pending symptomatic improvement, adequate diuresis, and ability to wean supplemental O2 Consults called: None Admission status:  Status is: Observation  The patient remains OBS appropriate and will d/c before 2 midnights.  Dispo: The patient is from: Home              Anticipated d/c is to: Home              Anticipated d/c date is: 1 day              Patient currently is not medically stable to d/c.    Zada Finders MD [facility]  If 7PM-7AM, please contact night-coverage [email or web address]  10/16/2020, 6:28 PM

## 2020-10-16 NOTE — ED Notes (Addendum)
Patient oxygen in the 70s, upon entering room patient with his oxygen off and asking for food.  Patient placed back on oxygen and sat improved to 97% on 6L. Per [place], EDP at bedside, patient cannot have anything to eat/drink at this time.

## 2020-10-17 DIAGNOSIS — R4182 Altered mental status, unspecified: Secondary | ICD-10-CM | POA: Diagnosis not present

## 2020-10-17 DIAGNOSIS — J449 Chronic obstructive pulmonary disease, unspecified: Secondary | ICD-10-CM | POA: Diagnosis present

## 2020-10-17 DIAGNOSIS — J9621 Acute and chronic respiratory failure with hypoxia: Secondary | ICD-10-CM

## 2020-10-17 DIAGNOSIS — D72829 Elevated white blood cell count, unspecified: Secondary | ICD-10-CM | POA: Diagnosis present

## 2020-10-17 DIAGNOSIS — Z20822 Contact with and (suspected) exposure to covid-19: Secondary | ICD-10-CM | POA: Diagnosis present

## 2020-10-17 DIAGNOSIS — Z7951 Long term (current) use of inhaled steroids: Secondary | ICD-10-CM | POA: Diagnosis not present

## 2020-10-17 DIAGNOSIS — E785 Hyperlipidemia, unspecified: Secondary | ICD-10-CM | POA: Diagnosis present

## 2020-10-17 DIAGNOSIS — J9602 Acute respiratory failure with hypercapnia: Secondary | ICD-10-CM | POA: Diagnosis not present

## 2020-10-17 DIAGNOSIS — I10 Essential (primary) hypertension: Secondary | ICD-10-CM | POA: Diagnosis not present

## 2020-10-17 DIAGNOSIS — Z8249 Family history of ischemic heart disease and other diseases of the circulatory system: Secondary | ICD-10-CM | POA: Diagnosis not present

## 2020-10-17 DIAGNOSIS — I5033 Acute on chronic diastolic (congestive) heart failure: Secondary | ICD-10-CM | POA: Diagnosis present

## 2020-10-17 DIAGNOSIS — E875 Hyperkalemia: Secondary | ICD-10-CM | POA: Diagnosis present

## 2020-10-17 DIAGNOSIS — Z7982 Long term (current) use of aspirin: Secondary | ICD-10-CM | POA: Diagnosis not present

## 2020-10-17 DIAGNOSIS — J9622 Acute and chronic respiratory failure with hypercapnia: Secondary | ICD-10-CM | POA: Diagnosis present

## 2020-10-17 DIAGNOSIS — J9601 Acute respiratory failure with hypoxia: Secondary | ICD-10-CM | POA: Diagnosis not present

## 2020-10-17 DIAGNOSIS — R0602 Shortness of breath: Secondary | ICD-10-CM

## 2020-10-17 DIAGNOSIS — Z6841 Body Mass Index (BMI) 40.0 and over, adult: Secondary | ICD-10-CM | POA: Diagnosis not present

## 2020-10-17 DIAGNOSIS — E662 Morbid (severe) obesity with alveolar hypoventilation: Secondary | ICD-10-CM | POA: Diagnosis present

## 2020-10-17 DIAGNOSIS — F17210 Nicotine dependence, cigarettes, uncomplicated: Secondary | ICD-10-CM | POA: Diagnosis present

## 2020-10-17 DIAGNOSIS — Z716 Tobacco abuse counseling: Secondary | ICD-10-CM | POA: Diagnosis not present

## 2020-10-17 DIAGNOSIS — Z9111 Patient's noncompliance with dietary regimen: Secondary | ICD-10-CM | POA: Diagnosis not present

## 2020-10-17 DIAGNOSIS — I110 Hypertensive heart disease with heart failure: Secondary | ICD-10-CM | POA: Diagnosis present

## 2020-10-17 DIAGNOSIS — Z79899 Other long term (current) drug therapy: Secondary | ICD-10-CM | POA: Diagnosis not present

## 2020-10-17 DIAGNOSIS — K449 Diaphragmatic hernia without obstruction or gangrene: Secondary | ICD-10-CM | POA: Diagnosis present

## 2020-10-17 DIAGNOSIS — Z713 Dietary counseling and surveillance: Secondary | ICD-10-CM | POA: Diagnosis not present

## 2020-10-17 DIAGNOSIS — R279 Unspecified lack of coordination: Secondary | ICD-10-CM | POA: Diagnosis not present

## 2020-10-17 DIAGNOSIS — Z9981 Dependence on supplemental oxygen: Secondary | ICD-10-CM | POA: Diagnosis not present

## 2020-10-17 LAB — URINALYSIS, ROUTINE W REFLEX MICROSCOPIC
Bilirubin Urine: NEGATIVE
Glucose, UA: NEGATIVE mg/dL
Hgb urine dipstick: NEGATIVE
Ketones, ur: NEGATIVE mg/dL
Leukocytes,Ua: NEGATIVE
Nitrite: NEGATIVE
Protein, ur: NEGATIVE mg/dL
Specific Gravity, Urine: 1.017 (ref 1.005–1.030)
pH: 5 (ref 5.0–8.0)

## 2020-10-17 LAB — BLOOD GAS, ARTERIAL
Acid-Base Excess: 5.3 mmol/L — ABNORMAL HIGH (ref 0.0–2.0)
Acid-Base Excess: 6.9 mmol/L — ABNORMAL HIGH (ref 0.0–2.0)
Bicarbonate: 39.9 mmol/L — ABNORMAL HIGH (ref 20.0–28.0)
Bicarbonate: 41.9 mmol/L — ABNORMAL HIGH (ref 20.0–28.0)
Delivery systems: POSITIVE
Delivery systems: POSITIVE
Drawn by: 29503
Drawn by: 44126
Expiratory PAP: 8
Expiratory PAP: 8
FIO2: 30
FIO2: 50
MECHVT: 600 mL
MECHVT: 600 mL
O2 Saturation: 82.1 %
O2 Saturation: 95.5 %
Patient temperature: 98.6
Patient temperature: 98.6
RATE: 20 resp/min
RATE: 24 resp/min
pCO2 arterial: 117 mmHg (ref 32.0–48.0)
pCO2 arterial: >120 mmHg (ref 32.0–48.0)
pH, Arterial: 7.16 — CL (ref 7.350–7.450)
pH, Arterial: 7.158 — CL (ref 7.350–7.450)
pO2, Arterial: 54.9 mmHg — ABNORMAL LOW (ref 83.0–108.0)
pO2, Arterial: 89.8 mmHg (ref 83.0–108.0)

## 2020-10-17 LAB — CBC
HCT: 55.2 % — ABNORMAL HIGH (ref 39.0–52.0)
Hemoglobin: 16.2 g/dL (ref 13.0–17.0)
MCH: 30.6 pg (ref 26.0–34.0)
MCHC: 29.3 g/dL — ABNORMAL LOW (ref 30.0–36.0)
MCV: 104.2 fL — ABNORMAL HIGH (ref 80.0–100.0)
Platelets: 300 10*3/uL (ref 150–400)
RBC: 5.3 MIL/uL (ref 4.22–5.81)
RDW: 13.8 % (ref 11.5–15.5)
WBC: 23.1 10*3/uL — ABNORMAL HIGH (ref 4.0–10.5)
nRBC: 0.8 % — ABNORMAL HIGH (ref 0.0–0.2)

## 2020-10-17 LAB — BASIC METABOLIC PANEL
Anion gap: 12 (ref 5–15)
Anion gap: 10 (ref 5–15)
BUN: 24 mg/dL — ABNORMAL HIGH (ref 6–20)
BUN: 28 mg/dL — ABNORMAL HIGH (ref 6–20)
CO2: 34 mmol/L — ABNORMAL HIGH (ref 22–32)
CO2: 38 mmol/L — ABNORMAL HIGH (ref 22–32)
Calcium: 8.7 mg/dL — ABNORMAL LOW (ref 8.9–10.3)
Calcium: 8.6 mg/dL — ABNORMAL LOW (ref 8.9–10.3)
Chloride: 94 mmol/L — ABNORMAL LOW (ref 98–111)
Chloride: 94 mmol/L — ABNORMAL LOW (ref 98–111)
Creatinine, Ser: 1.01 mg/dL (ref 0.61–1.24)
Creatinine, Ser: 1.11 mg/dL (ref 0.61–1.24)
GFR, Estimated: >60 mL/min (ref >60)
GFR, Estimated: >60 mL/min (ref >60)
Glucose, Bld: 143 mg/dL — ABNORMAL HIGH (ref 70–99)
Glucose, Bld: 100 mg/dL — ABNORMAL HIGH (ref 70–99)
Potassium: 5 mmol/L (ref 3.5–5.1)
Potassium: 6 mmol/L — ABNORMAL HIGH (ref 3.5–5.1)
Sodium: 140 mmol/L (ref 135–145)
Sodium: 142 mmol/L (ref 135–145)

## 2020-10-17 LAB — GLUCOSE, CAPILLARY
Glucose-Capillary: 103 mg/dL — ABNORMAL HIGH (ref 70–99)
Glucose-Capillary: 105 mg/dL — ABNORMAL HIGH (ref 70–99)
Glucose-Capillary: 139 mg/dL — ABNORMAL HIGH (ref 70–99)
Glucose-Capillary: 64 mg/dL — ABNORMAL LOW (ref 70–99)
Glucose-Capillary: 70 mg/dL (ref 70–99)
Glucose-Capillary: 92 mg/dL (ref 70–99)

## 2020-10-17 LAB — MAGNESIUM: Magnesium: 2.6 mg/dL — ABNORMAL HIGH (ref 1.7–2.4)

## 2020-10-17 LAB — PATHOLOGIST SMEAR REVIEW

## 2020-10-17 LAB — VITAMIN B12: Vitamin B-12: 326 pg/mL (ref 180–914)

## 2020-10-17 MED ORDER — LIP MEDEX EX OINT
TOPICAL_OINTMENT | CUTANEOUS | Status: AC
  Filled 2020-10-17: qty 7

## 2020-10-17 MED ORDER — FUROSEMIDE 10 MG/ML IJ SOLN
40.0000 mg | Freq: Once | INTRAMUSCULAR | Status: AC
  Administered 2020-10-17: 40 mg via INTRAVENOUS
  Filled 2020-10-17: qty 4

## 2020-10-17 MED ORDER — CHLORHEXIDINE GLUCONATE 0.12 % MT SOLN
15.0000 mL | Freq: Two times a day (BID) | OROMUCOSAL | Status: DC
  Administered 2020-10-17 – 2020-10-24 (×13): 15 mL via OROMUCOSAL
  Filled 2020-10-17 (×13): qty 15

## 2020-10-17 MED ORDER — DEXTROSE 50 % IV SOLN
12.5000 g | INTRAVENOUS | Status: AC
  Administered 2020-10-17: 12.5 g via INTRAVENOUS

## 2020-10-17 MED ORDER — NICOTINE 14 MG/24HR TD PT24
14.0000 mg | MEDICATED_PATCH | TRANSDERMAL | Status: DC
  Administered 2020-10-17 – 2020-10-24 (×7): 14 mg via TRANSDERMAL
  Filled 2020-10-17 (×7): qty 1

## 2020-10-17 MED ORDER — DEXTROSE 50 % IV SOLN
INTRAVENOUS | Status: AC
  Filled 2020-10-17: qty 50

## 2020-10-17 MED ORDER — PANTOPRAZOLE SODIUM 40 MG IV SOLR
40.0000 mg | Freq: Every day | INTRAVENOUS | Status: DC
  Administered 2020-10-17 – 2020-10-20 (×4): 40 mg via INTRAVENOUS
  Filled 2020-10-17 (×4): qty 40

## 2020-10-17 MED ORDER — ORAL CARE MOUTH RINSE
15.0000 mL | Freq: Two times a day (BID) | OROMUCOSAL | Status: DC
  Administered 2020-10-17 – 2020-10-18 (×3): 15 mL via OROMUCOSAL

## 2020-10-17 MED ORDER — CHLORHEXIDINE GLUCONATE CLOTH 2 % EX PADS
6.0000 | MEDICATED_PAD | Freq: Every day | CUTANEOUS | Status: DC
  Administered 2020-10-17 – 2020-10-19 (×3): 6 via TOPICAL

## 2020-10-17 MED ORDER — SODIUM ZIRCONIUM CYCLOSILICATE 10 G PO PACK
10.0000 g | PACK | Freq: Once | ORAL | Status: AC
  Administered 2020-10-17: 10 g via ORAL
  Filled 2020-10-17: qty 1

## 2020-10-17 MED ORDER — ENOXAPARIN SODIUM 80 MG/0.8ML ~~LOC~~ SOLN
80.0000 mg | SUBCUTANEOUS | Status: DC
  Administered 2020-10-17 – 2020-10-24 (×8): 80 mg via SUBCUTANEOUS
  Filled 2020-10-17 (×8): qty 0.8

## 2020-10-17 NOTE — Progress Notes (Signed)
Patient transported to ICU from 4W on BiPAP without complication.

## 2020-10-17 NOTE — Consult Note (Signed)
NAME:  Joseph Morales, MRN:  [id number], DOB:  [birth year], LOS: 0 ADMISSION DATE:  10/16/2020, CONSULTATION DATE: 10/17/20 REFERRING MD:  Dr. Rhona Leavens, CHIEF COMPLAINT:  AMS   Brief History   49 y/o M admitted 10/19 with reports of increasing SOB, DOE, orthopnea and LE swelling thought secondary to decompensated diastolic CHF, OSA/OHS.    History of present illness   49 y/o M who presented to [facility] on 10/19 with reports of 1 week of increasing shortness of breath, dyspnea with exertion, inability to lie flat and LE swelling.   He has a PMH of diastolic CHF (LVEF 70-75% 05/2020), asthma, OHS, and obesity with BMI of 47.25.  He was admitted from 6/24-6/29/21 with acute hypoxic respiratory failure in the setting of dCHF and OHS.  He was diuresed 2L and was discharged on [place] O2 at 2L.   The patient reported on admit that he had been trying to follow his prescribed diet but had not as closely in the last few months.  Initial work up notable for UDS positive for THC, BNP 225, Trop 54, bicarb on BMP 36, WBC 21, Hgb 15.6 and platelets of 304.  CXR 10/19 with mild hilar fullness, interstitial edema.  He was admitted per [facility] for evaluation of suspected decompensated dCHF.  The patient was diuresed (no I/O's documented) and home antihypertensive regimen was continued.     On 10/20 am, RRT was called for evaluation of altered mental status. Per staff report, the patient's O2 was increased to 15L overnight to maintain saturations.  He was placed on NIV per the rapid response team.  RT at bedside, ABG assessed with pH 7.1 / pCO2 >90.  The patient was somnolent but aroused to deep pain.  Asterixis noted on exam.  He was transferred to ICU and placed on AVAPS.   PCCM consulted for evaluation.    Past Medical History  Obesity  Diastolic CHF - LVEF 70-75% Ashtma  Former Smoker - 10pk yr hx HTN HLD  OHS  Significant Hospital Events   10/19 Admit  10/20 Tx to ICU for hypercarbic   Consults:    Procedures:     Significant Diagnostic Tests:  10/19 CTA Chest >> negative for PE, airway inflammation with mild bronchial wall thickening, small hiatal hernia, non-specific perinephric stranding within the visualized abdomen  Micro Data:  COVID 10/19 >> negative  Influenza A/B 10/19 >> negative   Antimicrobials:    Interim history/subjective:  Afebrile / WBC 23.1  Glucose trend 92-139  I/O UOP recorded   Objective   Blood pressure 132/60, pulse 87, temperature 97.9 F (36.6 C), temperature source Axillary, resp. rate 20, height 6\' 3"  (1.905 m), weight (!) 172.3 kg, SpO2 100 %.        Intake/Output Summary (Last 24 hours) at 10/17/2020 1349 Last data filed at 10/17/2020 0600 Gross per 24 hour  Intake 0 ml  Output 350 ml  Net -350 ml   Filed Weights   10/16/20 1319 10/17/20 0900  Weight: (!) 171.5 kg (!) 172.3 kg    Examination: General: adult male lying in bed in NAD, somnolent  HEENT: MM pink/dry, BiPAP mask in place, anicteric  Neuro: somnolent, arouses to painful stimulation with a startle, asterixis noted CV: s1s2 rrr, no m/r/g PULM: non-labored on BiPAP, lungs distant bilaterally without wheeze GI: soft, bsx4 active  Extremities: warm/dry, 2+ pre-tibial edema  Skin: no rashes or lesions  Resolved Hospital Problem list     Assessment & Plan:  Acute on Chronic Hypoxic / Hypercarbic Respiratory Failure  Suspected OHS / OSA  In setting of suspected non-compliance / decompensated dCHF -now BiPAP on AVAPS with back up rate  -follow up ABG -consider intubation if worsening mental status -follow intermittent CXR -additional lasix now  -will need sleep study at discharge  Acute Decompensation of Diastolic CHF -lasix 40 mg IV BID  -follow electrolytes closely with diuresis  -follow up BMP at 2000 to assess K  -monitor I/O's   Asthma without Exacerbation  -continue Dulera  -PRN albuterol for wheezing   Tobacco Abuse  1ppd habit  -nicotine patch    Leukocytosis  -assess UA -if fever, consider blood cultures  -follow up CBC  HTN, HLD -resume home Coreg, losartan, hydralazine, lasix, atorvastatin once able to take PO's   Small Hiatal Hernia  Noted on CT scan 10/19  -PPI    Best practice:  Diet: NPO Pain/Anxiety/Delirium protocol (if indicated): n/a VAP protocol (if indicated): n/a DVT prophylaxis: lovenox  GI prophylaxis: PPI  Glucose control: per primary  Mobility: BR, mobilize as tolerated  Code Status: Full Code  Family Communication: Mother updated at bedside  Disposition: ICU  Labs   CBC: Recent Labs  Lab 10/16/20 1358 10/17/20 0424  WBC 21.0* 23.1*  NEUTROABS 15.6*  --   HGB 15.6 16.2  HCT 52.2* 55.2*  MCV 100.6* 104.2*  PLT 304 300    Basic Metabolic Panel: Recent Labs  Lab 10/16/20 1358 10/17/20 0424  NA 141 140  K 4.7 5.0  CL 96* 94*  CO2 36* 34*  GLUCOSE 120* 143*  BUN 27* 24*  CREATININE 1.15 1.01  CALCIUM 8.8* 8.7*  MG  --  2.6*   GFR: Estimated Creatinine Clearance: 153 mL/min (by C-G formula based on SCr of 1.01 mg/dL). Recent Labs  Lab 10/16/20 1358 10/17/20 0424  WBC 21.0* 23.1*    Liver Function Tests: Recent Labs  Lab 10/16/20 1358  AST 25  ALT 39  ALKPHOS 57  BILITOT 1.8*  PROT 7.4  ALBUMIN 3.6   No results for input(s): LIPASE, AMYLASE in the last 168 hours. No results for input(s): AMMONIA in the last 168 hours.  ABG    Component Value Date/Time   PHART 7.160 (LL) 10/17/2020 1030   PCO2ART 117 (HH) 10/17/2020 1030   PO2ART 54.9 (L) 10/17/2020 1030   HCO3 39.9 (H) 10/17/2020 1030   O2SAT 82.1 10/17/2020 1030     Coagulation Profile: No results for input(s): INR, PROTIME in the last 168 hours.  Cardiac Enzymes: No results for input(s): CKTOTAL, CKMB, CKMBINDEX, TROPONINI in the last 168 hours.  HbA1C: Hgb A1c MFr Bld  Date/Time Value Ref Range Status  06/23/2020 05:02 AM 5.9 (H) 4.8 - 5.6 % Final    Comment:    (NOTE) Pre diabetes:           5.7%-6.4%  Diabetes:              >6.4%  Glycemic control for   <7.0% adults with diabetes     CBG: Recent Labs  Lab 10/17/20 0743 10/17/20 1152  GLUCAP 139* 92    Review of Systems:   Unable to complete as pt is altered / hypercarbic.   Past Medical History  He,  has a past medical history of Asthma, CHF (congestive heart failure) (HCC), and Obesity.   Surgical History    Past Surgical History:  Procedure Laterality Date   NO PAST SURGERIES       Social  History   reports that he has quit smoking. His smoking use included cigarettes. He has a 10.00 pack-year smoking history. He has never used smokeless tobacco. He reports current drug use. Drug: Marijuana. He reports that he does not drink alcohol.   Family History   His family history includes Breast cancer in his mother; Congestive Heart Failure in his maternal grandmother; Diabetes in his mother; Emphysema in his maternal grandmother.   Allergies No Known Allergies   Home Medications  Prior to Admission medications   Medication Sig Start Date End Date Taking? Authorizing Provider  albuterol (VENTOLIN HFA) 108 (90 Base) MCG/ACT inhaler Inhale 2 puffs into the lungs every 6 (six) hours as needed for wheezing or shortness of breath. 08/01/20  Yes Anders Simmonds, PA-C  aspirin 81 MG EC tablet Take 1 tablet (81 mg total) by mouth daily. Swallow whole. 08/01/20  Yes Georgian Co M, PA-C  atorvastatin (LIPITOR) 40 MG tablet Take 1 tablet (40 mg total) by mouth daily. 08/01/20 10/16/20 Yes Anders Simmonds, PA-C  carvedilol (COREG) 3.125 MG tablet Take 1 tablet (3.125 mg total) by mouth 2 (two) times daily with a meal. 08/01/20 10/16/20 Yes McClung, Angela M, PA-C  CINNAMON PO Take 1 capsule by mouth daily.   Yes [provider]  furosemide (LASIX) 40 MG tablet Take 1 tablet (40 mg total) by mouth 2 (two) times daily. 08/01/20 10/16/20 Yes McClung, Marzella Schlein, PA-C  hydrALAZINE (APRESOLINE) 50 MG tablet Take 1 tablet  (50 mg total) by mouth in the morning and at bedtime. 08/01/20 10/16/20 Yes McClung, Marzella Schlein, PA-C  losartan (COZAAR) 50 MG tablet Take 1 tablet (50 mg total) by mouth daily. 08/01/20 10/16/20 Yes McClung, Marzella Schlein, PA-C  mometasone-formoterol (DULERA) 100-5 MCG/ACT AERO Inhale 2 puffs into the lungs 2 (two) times daily. 08/01/20  Yes McClung, Angela M, PA-C  nicotine (NICODERM CQ - DOSED IN MG/24 HOURS) 21 mg/24hr patch Place 1 patch (21 mg total) onto the skin daily. 06/27/20  Yes Briant Cedar, MD     Critical care time: 68 minutes      Canary Brim, MSN, NP-C, AGACNP-BC [facility] 10/17/2020, 2:54 PM   Please see [email or web address] for pager details.

## 2020-10-17 NOTE — Significant Event (Signed)
Rapid Response Event Note   Reason for Call : Respiratory failure, unresponsive  Notified by respiratory therapist, Angie D., that patient was unresponsive on CPAP. Upon arrival to room patient did not respond to voice and hardly to sternal rub. ABG order placed. MD Rhona Leavens notified to come to beside.   Initial Focused Assessment:  Neuro: Unresponsive  Cardiac: NSR, BP HTN see vitals, pulses 2+ in radial and pedal locations bilaterally  Pulmonary: RR 30s-40s, O2 Sats 83% originally. Placed patient on BIPAP-patient O2 Sats improved to mid 90s.   Interventions:  CCM Consulted BIPAP placed ABG obtained-showed hypercarbia/hypoxia  Plan of Care:  Transfer to ICU for closer monitoring, high risk for intubation    Event Summary:   MD Notified: MD Rhona Leavens  -CCM notified as well Call Time: [phone] for rapid response call Arrival Time: 0755 End Time: 0900  Carolie Mcilrath C, RN

## 2020-10-17 NOTE — Progress Notes (Signed)
CRITICAL VALUE ALERT  Critical Value: pH: 7.158, pCo2 >120  Date & Time Notified: 10/20 at 1354  Provider Notified: MD Hunsucker at 1355  Orders Received/Actions taken: Continue BIPAP at this time. Patient following commands, mentation slightly improving.

## 2020-10-17 NOTE — Progress Notes (Signed)
Assessing LUE with [place] after one failed attempt.  Pt very jumpy, startling easily, annoyed with IV attempts.  Dr Judeth Horn at bedside states ok to leave 2nd PIV out at this time.  Notified that if pt intubated, central access recommended d/t small veins noted with ultrasound.

## 2020-10-17 NOTE — Progress Notes (Signed)
Came to Room for patients am tx. Pt unresponsive on cpap. Will barely respond with sternal rub. O2 saturations initially 82% respiratory rate 44. Rapid response called. Md paged. Switched pt to bipap 25/10. Abg obtained. V60 bipap then placed on pt with AVAPS settings.

## 2020-10-17 NOTE — Progress Notes (Signed)
Patient remains hypercarbic on ABG; no further intervention per MD. He is more alert than this morning. Wean FiO2 to maintain O2 sats of 88-92% due to patient history of CHF and asthma/COPD. Patient mandatory BiPAP V60 QHS/Naps per PCCM MD; patient history of sleep apnea and obesity (172.3 kg). RT will continue to monitor patient.

## 2020-10-17 NOTE — Progress Notes (Signed)
CRITICAL VALUE ALERT  Critical Value:  PH 7.160 CO2 117  Date & Time Notied:  10/17/20 1100  Provider Notified: Judeth Horn, MD  Orders Received/Actions taken: Continue with current plan of care

## 2020-10-17 NOTE — Progress Notes (Signed)
Hypoglycemic Event  CBG: 64  Treatment: 12.5g D50  Symptoms: none  Follow-up CBG: Time: 1619  CBG Result: 70  Possible Reasons for Event: NPO   Comments/MD notified: Hunsucker, MD 12.5g more of D50 given at follow up CBG     Lucca Ballo E Sakira Dahmer

## 2020-10-17 NOTE — Progress Notes (Signed)
PROGRESS NOTE    Joseph Morales  LHT:[id number] DOB: 10-Jun-1971 DOA: 10/16/2020 PCP: Patient, No Pcp Per    Brief Narrative:  49 y.o. male with medical history significant for chronic diastolic CHF (EF 57-26% By TTE 06/22/2020), asthma, HTN, HLD, tobacco use, and suspected OHS who presents to the ED for evaluation of progressive dyspnea and lower extremity swelling.  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 ABG obtained with findings notable for pH 7.16 and pCO2 of 117 -Critical Care consulted and at bedside, recommendation for transfer to ICU for further work up  Asthma: -this AM, decreased breath sounds throughout -Continue Dulera and as needed albuterol.  Leukocytosis: -No obvious evidence of infection noted  Hypertension: -Pt on home Coreg, losartan, hydralazine. -BP stable at this time  Hyperlipidemia: -Would continue home atorvastatin.  Tobacco use: -Pt reportedly usually smoking 1 pack/day, reportedly has not smoked the last 2 weeks.   DVT prophylaxis: Lovenox subq Code Status: Full Family Communication: Pt in room, family not at bedside  Status is:  Observation  The patient will require care spanning > 2 midnights and should be moved to inpatient because: Hemodynamically unstable, Altered mental status, Unsafe d/c plan and Inpatient level of care appropriate due to severity of illness  Dispo: The patient is from: Home              Anticipated d/c is to: Home              Anticipated d/c date is: > 3 days              Patient currently is not medically stable to d/c.       Consultants:   Critical Care  Procedures:     Antimicrobials: Anti-infectives (From admission, onward)   None       Subjective: Unable to assess given current mentation  Objective: Vitals:   10/17/20 0852 10/17/20 0900 10/17/20 0920 10/17/20 1000  BP: 124/62  (!) 110/42 132/60  Pulse: 94 91 98 87  Resp: 20 20 20 20   Temp:  97.9 F (36.6 C)    TempSrc:  Axillary    SpO2: 96% 95% 100% 100%  Weight:  (!) 172.3 kg    Height:  6\' 3"  (1.905 m)      Intake/Output Summary (Last 24 hours) at 10/17/2020 1252 Last data filed at 10/17/2020 0600 Gross per 24 hour  Intake 0 ml  Output 350 ml  Net -350 ml   Filed Weights   10/16/20 1319 10/17/20 0900  Weight: (!) 171.5 kg (!) 172.3 kg    Examination:  General exam: Appears calm and comfortable  Respiratory system: decreased breath sounds throughout, increased resp effort Cardiovascular system: S1 & S2 heard, Regular Gastrointestinal system: Abdomen  is nondistended, soft and nontender. No organomegaly or masses felt. Normal bowel sounds heard. Central nervous system: Lethargic, difficult to arouse. No focal neurological deficits. Extremities: Symmetric 5 x 5 power. Skin: No rashes, lesions Psychiatry: Unable to assess given current mentation  Data Reviewed: I have personally reviewed following labs and imaging studies  CBC: Recent Labs  Lab 10/16/20 1358 10/17/20 0424  WBC 21.0* 23.1*  NEUTROABS 15.6*  --   HGB 15.6 16.2  HCT 52.2* 55.2*  MCV 100.6* 104.2*  PLT 304 300   Basic  Metabolic Panel: Recent Labs  Lab 10/16/20 1358 10/17/20 0424  NA 141 140  K 4.7 5.0  CL 96* 94*  CO2 36* 34*  GLUCOSE 120* 143*  BUN 27* 24*  CREATININE 1.15 1.01  CALCIUM 8.8* 8.7*  MG  --  2.6*   GFR: Estimated Creatinine Clearance: 153 mL/min (by C-G formula based on SCr of 1.01 mg/dL). Liver Function Tests: Recent Labs  Lab 10/16/20 1358  AST 25  ALT 39  ALKPHOS 57  BILITOT 1.8*  PROT 7.4  ALBUMIN 3.6   No results for input(s): LIPASE, AMYLASE in the last 168 hours. No results for input(s): AMMONIA in the last 168 hours. Coagulation Profile: No results for input(s): INR, PROTIME in the last 168 hours. Cardiac Enzymes: No results for input(s): CKTOTAL, CKMB, CKMBINDEX, TROPONINI in the last 168 hours. BNP (last 3 results) No results for input(s): PROBNP in the last 8760 hours. HbA1C: No results for input(s): HGBA1C in the last 72 hours. CBG: Recent Labs  Lab 10/17/20 0743 10/17/20 1152  GLUCAP 139* 92   Lipid Profile: No results for input(s): CHOL, HDL, LDLCALC, TRIG, CHOLHDL, LDLDIRECT in the last 72 hours. Thyroid Function Tests: No results for input(s): TSH, T4TOTAL, FREET4, T3FREE, THYROIDAB in the last 72 hours. Anemia Panel: Recent Labs    10/17/20 0424  VITAMINB12 326   Sepsis Labs: No results for input(s): PROCALCITON, LATICACIDVEN in the last 168 hours.  Recent Results (from the past 240 hour(s))  Respiratory Panel by RT PCR (Flu A&B, Covid) -     Status: None   Collection Time: 10/16/20  2:35 PM  Result Value Ref Range Status   SARS Coronavirus 2 by RT PCR NEGATIVE NEGATIVE Final    Comment: (NOTE) SARS-CoV-2 target nucleic acids are NOT DETECTED.  The SARS-CoV-2 RNA is generally detectable in upper respiratoy specimens during the acute phase of infection. The lowest concentration of SARS-CoV-2 viral copies this assay can detect is 131 copies/mL. A negative result does not preclude SARS-Cov-2 infection and should not be used as the  sole basis for treatment or other patient management decisions. A negative result may occur with  improper specimen collection/handling, submission of specimen other than nasopharyngeal swab, presence of viral mutation(s) within the areas targeted by this assay, and inadequate number of viral copies (<131 copies/mL). A negative result must be combined with clinical observations, patient history, and epidemiological information. The expected result is Negative.  Fact Sheet for Patients:  https://www.moore.com/  Fact Sheet for Healthcare Providers:  https://www.young.biz/  This test is no t yet approved or cleared by the [place] FDA and  has been authorized for detection and/or diagnosis of SARS-CoV-2 by FDA under an Emergency Use Authorization (EUA). This EUA will remain  in effect (meaning this test can be used) for the duration of the COVID-19 declaration under Section 564(b)(1) of the Act, 21 U.S.C. section 360bbb-3(b)(1), unless the authorization is terminated or revoked sooner.  Influenza A by PCR NEGATIVE NEGATIVE Final   Influenza B by PCR NEGATIVE NEGATIVE Final    Comment: (NOTE) The Xpert Xpress SARS-CoV-2/FLU/RSV assay is intended as an aid in  the diagnosis of influenza from Nasopharyngeal swab specimens and  should not be used as a sole basis for treatment. Nasal washings and  aspirates are unacceptable for Xpert Xpress SARS-CoV-2/FLU/RSV  testing.  Fact Sheet for Patients: https://www.moore.com/  Fact Sheet for Healthcare Providers: https://www.young.biz/  This test is not yet approved or cleared by the [place] FDA and  has been authorized for detection and/or diagnosis of SARS-CoV-2 by  FDA under an Emergency Use Authorization (EUA). This EUA will remain  in effect (meaning this test can be used) for the duration of the  Covid-19 declaration under Section 564(b)(1) of  the Act, 21  U.S.C. section 360bbb-3(b)(1), unless the authorization is  terminated or revoked. Performed at [facility], [address] [place], [place] [address]      Radiology Studies: CT Angio Chest PE W/Cm &/Or Wo Cm  Result Date: 10/16/2020 CLINICAL DATA:  Asthma, congestive heart failure, increasing dyspnea on exertion, orthopnea EXAM: CT ANGIOGRAPHY CHEST WITH CONTRAST TECHNIQUE: Multidetector CT imaging of the chest was performed using the standard protocol during bolus administration of intravenous contrast. Multiplanar CT image reconstructions and MIPs were obtained to evaluate the vascular anatomy. CONTRAST:  OMNIPAQUE IOHEXOL 350 MG/ML SOLN COMPARISON:  None. FINDINGS: Cardiovascular: Satisfactory opacification of the pulmonary arteries to the segmental level. No evidence of pulmonary embolism. Central pulmonary arteries are of normal caliber. No significant coronary artery calcification. Normal heart size. No pericardial effusion. Thoracic aorta is unremarkable. Mediastinum/Nodes: Thyroid unremarkable. No pathologic thoracic adenopathy. Small hiatal hernia. Lungs/Pleura: The lungs are clear. No pneumothorax or pleural effusion. There is mild bronchial wall thickening centrally suggesting mild airway inflammation. No central obstructing mass. Upper Abdomen: Mild nonspecific bilateral perinephric stranding is noted, incompletely assessed on this examination. Otherwise, no acute abnormality. Musculoskeletal: No acute bone abnormality. Review of the MIP images confirms the above findings. IMPRESSION: No acute pulmonary embolism. Airway inflammation with mild bronchial wall thickening. Small hiatal hernia. Nonspecific perinephric stranding within the visualized abdomen. Electronically Signed   By: Helyn Numbers MD   On: 10/16/2020 16:55   DG Chest Port 1 View  Result Date: 10/16/2020 CLINICAL DATA:  Shortness of breath and leg swelling EXAM: PORTABLE CHEST 1 VIEW  COMPARISON:  06/21/2020 FINDINGS: Cardiac shadow is stable. The lungs are well aerated bilaterally. No focal infiltrate or sizable effusion is seen. No bony abnormality is noted IMPRESSION: No acute abnormality noted. Electronically Signed   By: Alcide Clever M.D.   On: 10/16/2020 15:10    Scheduled Meds: . atorvastatin  40 mg Oral Daily  . carvedilol  3.125 mg Oral BID  . chlorhexidine  15 mL Mouth Rinse BID  . Chlorhexidine Gluconate Cloth  6 each Topical Daily  . enoxaparin (LOVENOX) injection  40 mg Subcutaneous Q24H  . furosemide  40 mg Intravenous Q12H  . hydrALAZINE  50 mg Oral BID  . losartan  50 mg Oral Daily  . mouth rinse  15 mL Mouth Rinse q12n4p  . mometasone-formoterol  2 puff Inhalation BID  . sodium chloride flush  3 mL Intravenous Q12H   Continuous Infusions:   LOS: 0 days   Rickey Barbara, MD [facility] Pager On Amion  If 7PM-7AM, please contact night-coverage 10/17/2020, 12:52 PM

## 2020-10-17 NOTE — Progress Notes (Signed)
CRITICAL VALUE ALERT  Critical Value: ISTAT ABG (Questional Mixed Sample) PH 7.131, PCO2 >97, PO2 67 Discussed with POC in regards to ISTAT results  Date & Time Notified: 10/20 at 0816 via ISTAT  Provider Notified: CCM MD Hunsucker   Orders Received/Actions taken: Patient placed on BIPAP, will reassess ABG in a hour. Patient high risk for intubation.

## 2020-10-17 NOTE — Progress Notes (Signed)
eLink Physician-Brief Progress Note Patient Name: Joseph Morales DOB: 08-Nov-1971 MRN: [id number]   Date of Service  10/17/2020  HPI/Events of Note  Hyperkalemia - K+ = 4.7 --> 5.0 --> 6.0.   eICU Interventions  Plan: 1. Lokelma 10 gm PO now.  2. Repeat BMP at 5 AM.     Intervention Category Major Interventions: Electrolyte abnormality - evaluation and management  Lenell Antu 10/17/2020, 9:33 PM

## 2020-10-18 ENCOUNTER — Inpatient Hospital Stay (HOSPITAL_COMMUNITY): Payer: Medicaid Other

## 2020-10-18 ENCOUNTER — Telehealth: Payer: Self-pay | Admitting: Pulmonary Disease

## 2020-10-18 LAB — BASIC METABOLIC PANEL
Anion gap: 10 (ref 5–15)
Anion gap: 12 (ref 5–15)
BUN: 26 mg/dL — ABNORMAL HIGH (ref 6–20)
BUN: 29 mg/dL — ABNORMAL HIGH (ref 6–20)
CO2: 40 mmol/L — ABNORMAL HIGH (ref 22–32)
CO2: 40 mmol/L — ABNORMAL HIGH (ref 22–32)
Calcium: 8.9 mg/dL (ref 8.9–10.3)
Calcium: 8.9 mg/dL (ref 8.9–10.3)
Chloride: 92 mmol/L — ABNORMAL LOW (ref 98–111)
Chloride: 90 mmol/L — ABNORMAL LOW (ref 98–111)
Creatinine, Ser: 1.14 mg/dL (ref 0.61–1.24)
Creatinine, Ser: 1.11 mg/dL (ref 0.61–1.24)
GFR, Estimated: >60 mL/min (ref >60)
GFR, Estimated: >60 mL/min (ref >60)
Glucose, Bld: 105 mg/dL — ABNORMAL HIGH (ref 70–99)
Glucose, Bld: 94 mg/dL (ref 70–99)
Potassium: 5.5 mmol/L — ABNORMAL HIGH (ref 3.5–5.1)
Potassium: 4.6 mmol/L (ref 3.5–5.1)
Sodium: 142 mmol/L (ref 135–145)
Sodium: 142 mmol/L (ref 135–145)

## 2020-10-18 LAB — POCT I-STAT 7, (LYTES, BLD GAS, ICA,H+H)
Calcium, Ion: 0.87 mmol/L — CL (ref 1.15–1.40)
HCT: 53 % — ABNORMAL HIGH (ref 39.0–52.0)
Hemoglobin: 18 g/dL — ABNORMAL HIGH (ref 13.0–17.0)
Patient temperature: 97.7
Potassium: 5.3 mmol/L — ABNORMAL HIGH (ref 3.5–5.1)
Sodium: 142 mmol/L (ref 135–145)
pCO2 arterial: >97 mmHg (ref 32.0–48.0)
pH, Arterial: 7.131 — CL (ref 7.350–7.450)
pO2, Arterial: 65 mmHg — ABNORMAL LOW (ref 83.0–108.0)

## 2020-10-18 LAB — MRSA PCR SCREENING: MRSA by PCR: NEGATIVE

## 2020-10-18 LAB — CBC
HCT: 56 % — ABNORMAL HIGH (ref 39.0–52.0)
Hemoglobin: 16.1 g/dL (ref 13.0–17.0)
MCH: 29.8 pg (ref 26.0–34.0)
MCHC: 28.8 g/dL — ABNORMAL LOW (ref 30.0–36.0)
MCV: 103.5 fL — ABNORMAL HIGH (ref 80.0–100.0)
Platelets: 253 10*3/uL (ref 150–400)
RBC: 5.41 MIL/uL (ref 4.22–5.81)
RDW: 14 % (ref 11.5–15.5)
WBC: 20.6 10*3/uL — ABNORMAL HIGH (ref 4.0–10.5)
nRBC: 0.3 % — ABNORMAL HIGH (ref 0.0–0.2)

## 2020-10-18 LAB — GLUCOSE, CAPILLARY
Glucose-Capillary: 92 mg/dL (ref 70–99)
Glucose-Capillary: 84 mg/dL (ref 70–99)
Glucose-Capillary: 96 mg/dL (ref 70–99)

## 2020-10-18 MED ORDER — IPRATROPIUM-ALBUTEROL 0.5-2.5 (3) MG/3ML IN SOLN
3.0000 mL | Freq: Four times a day (QID) | RESPIRATORY_TRACT | Status: DC
  Administered 2020-10-18 – 2020-10-19 (×3): 3 mL via RESPIRATORY_TRACT
  Filled 2020-10-18 (×3): qty 3

## 2020-10-18 MED ORDER — CARVEDILOL 6.25 MG PO TABS
6.2500 mg | ORAL_TABLET | Freq: Two times a day (BID) | ORAL | Status: DC
  Administered 2020-10-18 – 2020-10-25 (×14): 6.25 mg via ORAL
  Filled 2020-10-18 (×14): qty 1

## 2020-10-18 NOTE — Telephone Encounter (Signed)
Please schedule Joseph Morales for sleep evaluation at next available appt with any sleep MD.  Call with any questions.    Canary Brim, MSN, NP-C, AGACNP-BC [facility] 10/18/2020, 11:14 AM   Please see [email or web address] for pager details.

## 2020-10-18 NOTE — TOC Initial Note (Addendum)
Transition of Care [facility]) - Initial/Assessment Note    Patient Details  Name: Joseph Morales MRN: [id number] Date of Birth: September 13, 1971  Transition of Care [facility]) CM/SW Contact:    Leeroy Cha, RN Phone Number: 10/18/2020, 8:33 AM  Clinical Narrative:                 49 y.o. male with medical history significant for chronic diastolic CHF (EF 76-73% By TTE 06/22/2020), asthma, HTN, HLD, tobacco use, and suspected OHS who presents to the ED for evaluation of progressive dyspnea and lower extremity swelling.  Patient previously hospitalized 06/21/2020-06/26/2020 for hypoxia presumed multifactorial due to diastolic CHF and OHS with possible obstructive lung disease contributing.  He was diuresed and was requiring 2 L supplemental O2 via [place] on discharge.  Patient states shortly after discharge he was adhering to dietary recommendations and was feeling well however admits over the last couple months he has done poorly regarding lifestyle and dietary modifications.  He has had progressive swelling of his lower extremities and dyspnea on exertion. This past week he has become easily fatigued and short of breath with minimal activity. He says he is now having to sleep with his upper half of his body straight up. He says he is waking up short of breath in the middle the night. He says he does snore and has significant daytime fatigue and occasional headaches. He he says his symptoms and swelling is worse than when presented to the hospital back in June. He thinks he has gained about 30 lbs since last time he was in the hospital. He says he has been taking his Lasix and other cardiac meds regularly.  He says he does use 2 L supplemental oxygen at night. He has not had a formal sleep study completed. He reports smoking tobacco, 1 pack a day, but stopped 2 weeks ago.  ED Course:  Initial vitals showed BP 151/79, pulse 101, RR 22, temp 98.3 Fahrenheit, SPO2 75% on room air.  Patient subsequently placed  on 6 L O2 via [place] with improved SPO2 >95%.  Labs are notable for WBC 21.0, hemoglobin 15.6, platelets 304,000, sodium 141, potassium 4.7, bicarb 36, BUN 27, creatinine 1.15, serum glucose 120, AST 25, ALT 39, alk phos 57, total bilirubin 1.8, BNP 225.1, high-sensitivity troponin I 54 > 52.  SARS-CoV-2 PCR is negative.  Influenza A/B PCR negative.  Portable chest x-ray was negative for focal consolidation, edema, or effusion.  CTA chest PE study was negative for acute PE.  Airway inflammation with mild bronchial wall thickening is noted Single from from has family in the area, plan is to return to home, following for progression.  1319+/ mrn and information sent to [employer] with financial navigation sent. Expected Discharge Plan: Home/Self Care Barriers to Discharge: Barriers Resolved   Patient Goals and CMS Choice Patient states their goals for this hospitalization and ongoing recovery are:: to go home CMS Medicare.gov Compare Post Acute Care list provided to:: Patient    Expected Discharge Plan and Services Expected Discharge Plan: Home/Self Care   Discharge Planning Services: CM Consult   Living arrangements for the past 2 months: Single Family Home                                      Prior Living Arrangements/Services Living arrangements for the past 2 months: Single Family Home Lives with:: Self Patient language and need  for interpreter reviewed:: Yes Do you feel safe going back to the place where you live?: Yes      Need for Family Participation in Patient Care: Yes (Comment) Care giver support system in place?: Yes (comment)   Criminal Activity/Legal Involvement Pertinent to Current Situation/Hospitalization: No - Comment as needed  Activities of Daily Living Home Assistive Devices/Equipment: None ADL Screening (condition at time of admission) Patient's cognitive ability adequate to safely complete daily activities?: Yes Is the patient deaf or have  difficulty hearing?: No Does the patient have difficulty seeing, even when wearing glasses/contacts?: No Does the patient have difficulty concentrating, remembering, or making decisions?: No Patient able to express need for assistance with ADLs?: Yes Does the patient have difficulty dressing or bathing?: No Independently performs ADLs?: No Communication: Independent Dressing (OT): Independent Grooming: Independent Feeding: Independent Bathing: Independent Toileting: Independent In/Out Bed: Needs assistance Is this a change from baseline?: Pre-admission baseline Walks in Home: Needs assistance Is this a change from baseline?: Pre-admission baseline Does the patient have difficulty walking or climbing stairs?: Yes Weakness of Legs: Both Weakness of Arms/Hands: Both  Permission Sought/Granted                  Emotional Assessment Appearance:: Appears stated age Attitude/Demeanor/Rapport: Engaged Affect (typically observed): Calm Orientation: : Oriented to Place, Oriented to Self, Oriented to Situation, Oriented to  Time Alcohol / Substance Use: Tobacco Use, Alcohol Use Psych Involvement: No (comment)  Admission diagnosis:  SOB (shortness of breath) [R06.02] Acute on chronic respiratory failure with hypoxia ([place]) [J96.21] Patient Active Problem List   Diagnosis Date Noted  . Acute on chronic respiratory failure with hypoxia ([place]) 10/16/2020  . Essential hypertension 10/16/2020  . Hyperlipidemia 10/16/2020  . Asthma 06/21/2020  . Acute on chronic diastolic CHF (congestive heart failure) ([place]) 06/21/2020  . Hypoxia 06/21/2020  . Orthopnea 06/21/2020  . Chronic venous insufficiency 06/21/2020  . Cellulitis 06/21/2020  . Obesity, Class III, BMI 40-49.9 (morbid obesity) ([place]) 06/21/2020  . Obesity hypoventilation syndrome ([place]) 06/21/2020  . Leucocytosis 06/21/2020  . Chronic respiratory alkalosis 06/21/2020  . Hypertensive urgency 06/21/2020  . Needs sleep apnea  assessment 06/21/2020  . Calf tenderness 06/21/2020   PCP:  Patient, No Pcp Per Pharmacy:   [facility] [place], [place] - [address] AT [place] [address] [place] [address] Phone: [phone] Fax: [place], [place] [address] [place] [address] Phone: [phone] Fax: [phone]     Social Determinants of Health (SDOH) Interventions    Readmission Risk Interventions No flowsheet data found.

## 2020-10-18 NOTE — Progress Notes (Signed)
Pt. placed on BiPAP V-60(AVAPS)for h/s, after med. aerosols given, pt. notified RT few minutes after placement stating that he could not tolerate it this evening, this RT stressed importance of wearing BiPAP as per home use this evening, placed pt. back on 4 lpm n/c, RN made aware.

## 2020-10-18 NOTE — Telephone Encounter (Signed)
Consult appointment scheduled for November 19th at 2:30 pm scheduled as this is the first available, appointment will print out on his d/c paperwork from the hospital.

## 2020-10-18 NOTE — Telephone Encounter (Signed)
Called to schedule appointment with sleep doctor, patient is still currently an inpatient as of 10/18/2020.  We will reach out to him after d/c for an appointment.

## 2020-10-18 NOTE — Progress Notes (Addendum)
NAME:  Joseph Morales, MRN:  [id number], DOB:  [birth year], LOS: 1 ADMISSION DATE:  10/16/2020, CONSULTATION DATE: 10/17/20 REFERRING MD:  Dr. Rhona Leavens, CHIEF COMPLAINT:  AMS   Brief History   49 y/o M admitted 10/19 with reports of increasing SOB, DOE, orthopnea and LE swelling thought secondary to decompensated diastolic CHF (LVEF 70-75% 05/2020) , OSA/OHS.    Past Medical History  Obesity  Diastolic CHF - LVEF 70-75% Ashtma  Former Smoker - 10pk yr hx HTN HLD  OHS  Significant Hospital Events   10/19 Admit  10/20 Tx to ICU for hypercarbic   Consults:    Procedures:    Significant Diagnostic Tests:  10/19 CTA Chest >> negative for PE, airway inflammation with mild bronchial wall thickening, small hiatal hernia, non-specific perinephric stranding within the visualized abdomen  Micro Data:  COVID 10/19 >> negative  Influenza A/B 10/19 >> negative  UA 10/20 >> negative   Antimicrobials:    Interim history/subjective:  Afebrile / WBC 20.6 Glucose range 84-103  I/O 2.5L UOP, -1.6L in last 24 hours  Wore bipap overnight, more alert this am   Objective   Blood pressure (!) 164/71, pulse 96, temperature 97.9 F (36.6 C), temperature source Oral, resp. rate (!) 22, height 6\' 3"  (1.905 m), weight (!) 168.1 kg, SpO2 92 %.        Intake/Output Summary (Last 24 hours) at 10/18/2020 1033 Last data filed at 10/18/2020 1000 Gross per 24 hour  Intake 240 ml  Output 4460 ml  Net -4220 ml   Filed Weights   10/16/20 1319 10/17/20 0900 10/18/20 0500  Weight: (!) 171.5 kg (!) 172.3 kg (!) 168.1 kg    Examination: General: adult male lying in bed in NAD, off bipap  HEENT: MM pink/moist, anicteric, pupils reactive Neuro: Awake, alert / watching TV, oriented to self, repeats answers to questions inappropriately - unable to state the president but repeats the word president, knew Halloween is coming in the next weeks, speech clear, MAE, non-focal CV: s1s2 RRR, no m/r/g PULM:  non-labored on [place], diminished on left, wheezing on right GI: soft, bsx4 active, protuberant   Extremities: warm/dry, 1-2+ pre-tibial pitting edema  Skin: no rashes or lesions   Resolved Hospital Problem list     Assessment & Plan:   Acute on Chronic Hypoxic / Hypercarbic Respiratory Failure  Suspected OHS / OSA  In setting of suspected non-compliance / decompensated dCHF & OHS/OSA.  Pt reports he has a CPAP at home, ??? Not compliant and do not see a sleep study in the system -QHS mandatory BiPAP with PRN daytime sleep  -follow mental status as marker for improvement, hold further ABG's  -monitor intermittent CXR  -lasix as below -will need sleep study at discharge, outpt appt being arranged -O2 for sats 88-95% -pulmonary hygiene -IS, flutter   Acute Decompensation of Diastolic CHF -increase lasix to 60 mg IV BID  -follow electrolytes with active diuresis  -strict I/O's  Asthma without Exacerbation  -dulera -duoneb Q6 for 24 hours, then consider changing back to PRN  -PRN albuterol  Tobacco Abuse  1ppd habit  -nicotine patch   Leukocytosis  UA negative.  -if fever >101.5 assess cultures -follow fever curve / WBC   HTN, HLD -continue hydralazine, atorvastatin -hold losartan with hyperkalemia  -increase coreg to 6.125 mg BID   Small Hiatal Hernia  Noted on CT scan 10/19  -PPI   Best practice:  Diet: NPO Pain/Anxiety/Delirium protocol (if indicated): n/a VAP protocol (if  indicated): n/a DVT prophylaxis: lovenox  GI prophylaxis: PPI  Glucose control: per primary  Mobility: BR, mobilize as tolerated  Code Status: Full Code  Family Communication: Patient updated on plan of care.  Disposition: Change to SDU, to [facility] as of 10/22   Labs   CBC: Recent Labs  Lab 10/16/20 1358 10/17/20 0424 10/18/20 0250  WBC 21.0* 23.1* 20.6*  NEUTROABS 15.6*  --   --   HGB 15.6 16.2 16.1  HCT 52.2* 55.2* 56.0*  MCV 100.6* 104.2* 103.5*  PLT 304 300 253    Basic  Metabolic Panel: Recent Labs  Lab 10/16/20 1358 10/17/20 0424 10/17/20 2050 10/18/20 0250  NA 141 140 142 142  K 4.7 5.0 6.0* 5.5*  CL 96* 94* 94* 92*  CO2 36* 34* 38* 40*  GLUCOSE 120* 143* 100* 105*  BUN 27* 24* 28* 26*  CREATININE 1.15 1.01 1.11 1.14  CALCIUM 8.8* 8.7* 8.6* 8.9  MG  --  2.6*  --   --    GFR: Estimated Creatinine Clearance: 133.6 mL/min (by C-G formula based on SCr of 1.14 mg/dL). Recent Labs  Lab 10/16/20 1358 10/17/20 0424 10/18/20 0250  WBC 21.0* 23.1* 20.6*    Liver Function Tests: Recent Labs  Lab 10/16/20 1358  AST 25  ALT 39  ALKPHOS 57  BILITOT 1.8*  PROT 7.4  ALBUMIN 3.6   No results for input(s): LIPASE, AMYLASE in the last 168 hours. No results for input(s): AMMONIA in the last 168 hours.  ABG    Component Value Date/Time   PHART 7.158 (LL) 10/17/2020 1327   PCO2ART >120 (HH) 10/17/2020 1327   PO2ART 89.8 10/17/2020 1327   HCO3 41.9 (H) 10/17/2020 1327   O2SAT 95.5 10/17/2020 1327     Coagulation Profile: No results for input(s): INR, PROTIME in the last 168 hours.  Cardiac Enzymes: No results for input(s): CKTOTAL, CKMB, CKMBINDEX, TROPONINI in the last 168 hours.  HbA1C: Hgb A1c MFr Bld  Date/Time Value Ref Range Status  06/23/2020 05:02 AM 5.9 (H) 4.8 - 5.6 % Final    Comment:    (NOTE) Pre diabetes:          5.7%-6.4%  Diabetes:              >6.4%  Glycemic control for   <7.0% adults with diabetes     CBG: Recent Labs  Lab 10/17/20 1618 10/17/20 1949 10/17/20 2341 10/18/20 0414 10/18/20 0801  GLUCAP 70 105* 103* 92 84     Critical care time: n/a        Canary Brim, MSN, NP-C, AGACNP-BC  Pulmonary & Critical Care 10/18/2020, 10:33 AM   Please see [email or web address] for pager details.

## 2020-10-18 NOTE — Progress Notes (Signed)
Pt taken off BiPAP machine and placed on 4L [place], pt tolerating well. CCM aware. Vitals stable at this time, RT will continue to monitor.

## 2020-10-19 ENCOUNTER — Inpatient Hospital Stay (HOSPITAL_COMMUNITY): Payer: Medicaid Other

## 2020-10-19 DIAGNOSIS — J9601 Acute respiratory failure with hypoxia: Secondary | ICD-10-CM

## 2020-10-19 DIAGNOSIS — J9602 Acute respiratory failure with hypercapnia: Secondary | ICD-10-CM

## 2020-10-19 LAB — CBC
HCT: 51.7 % (ref 39.0–52.0)
Hemoglobin: 15.5 g/dL (ref 13.0–17.0)
MCH: 30.1 pg (ref 26.0–34.0)
MCHC: 30 g/dL (ref 30.0–36.0)
MCV: 100.4 fL — ABNORMAL HIGH (ref 80.0–100.0)
Platelets: 268 10*3/uL (ref 150–400)
RBC: 5.15 MIL/uL (ref 4.22–5.81)
RDW: 13.6 % (ref 11.5–15.5)
WBC: 19.2 10*3/uL — ABNORMAL HIGH (ref 4.0–10.5)
nRBC: 0 % (ref 0.0–0.2)

## 2020-10-19 LAB — BASIC METABOLIC PANEL
Anion gap: 13 (ref 5–15)
BUN: 32 mg/dL — ABNORMAL HIGH (ref 6–20)
CO2: 38 mmol/L — ABNORMAL HIGH (ref 22–32)
Calcium: 8.7 mg/dL — ABNORMAL LOW (ref 8.9–10.3)
Chloride: 89 mmol/L — ABNORMAL LOW (ref 98–111)
Creatinine, Ser: 1.21 mg/dL (ref 0.61–1.24)
GFR, Estimated: >60 mL/min (ref >60)
Glucose, Bld: 108 mg/dL — ABNORMAL HIGH (ref 70–99)
Potassium: 4.7 mmol/L (ref 3.5–5.1)
Sodium: 140 mmol/L (ref 135–145)

## 2020-10-19 LAB — GLUCOSE, CAPILLARY
Glucose-Capillary: 93 mg/dL (ref 70–99)
Glucose-Capillary: 113 mg/dL — ABNORMAL HIGH (ref 70–99)

## 2020-10-19 MED ORDER — ORAL CARE MOUTH RINSE
15.0000 mL | Freq: Two times a day (BID) | OROMUCOSAL | Status: DC
  Administered 2020-10-19 – 2020-10-24 (×9): 15 mL via OROMUCOSAL

## 2020-10-19 MED ORDER — IPRATROPIUM-ALBUTEROL 0.5-2.5 (3) MG/3ML IN SOLN: 3.0000 mL | RESPIRATORY_TRACT | Status: DC | PRN

## 2020-10-19 NOTE — Plan of Care (Signed)
  Problem: Education: Goal: Knowledge of General Education information will improve Description: Including pain rating scale, medication(s)/side effects and non-pharmacologic comfort measures Outcome: Progressing   Problem: Activity: Goal: Risk for activity intolerance will decrease Outcome: Progressing   Problem: Nutrition: Goal: Adequate nutrition will be maintained Outcome: Progressing   Problem: Elimination: Goal: Will not experience complications related to bowel motility Outcome: Progressing Goal: Will not experience complications related to urinary retention Outcome: Progressing   Problem: Pain Managment: Goal: General experience of comfort will improve Outcome: Progressing   Problem: Safety: Goal: Ability to remain free from injury will improve Outcome: Progressing   Problem: Skin Integrity: Goal: Risk for impaired skin integrity will decrease Outcome: Progressing  - Informed the need for BIPAP at bedtime,as per pt he is not going to sleep yet and will inform RN once ready for Bipap, noted O2 sat at >95% at 4lpm .

## 2020-10-19 NOTE — Progress Notes (Signed)
PROGRESS NOTE    Joseph Morales  IRS:[id number] DOB: Jan 25, 1971 DOA: 10/16/2020 PCP: Patient, No Pcp Per    Brief Narrative:  49 y.o. male with medical history significant for chronic diastolic CHF (EF 00-93% By TTE 06/22/2020), asthma, HTN, HLD, tobacco use, and suspected OHS who presents to the ED for evaluation of progressive dyspnea and lower extremity swelling.  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 Pt educated on the importance of home CPAP -Continue diuresis as per below -Continue to wean O2 as tolerated. Currently at [facility]  Asthma: -Continue Dulera and as needed albuterol. -No wheezing on exam this AM  Leukocytosis: -No obvious evidence of infection noted -WBC trending down  Hypertension: -Pt on home Coreg, losartan, hydralazine. -BP remains stable at this time  Hyperlipidemia: -Would continue home atorvastatin as tolerated  Tobacco use: -Pt reportedly usually smoking 1 pack/day, reportedly has not smoked the last 2 weeks. -Recommend cessation  Morbid obesity -Recommend  diet/lifestyle modification   DVT prophylaxis: Lovenox subq Code Status: Full Family Communication: Pt in room, family not at bedside  Status is: Observation  The patient will require care spanning > 2 midnights and should be moved to inpatient because: Hemodynamically unstable, Altered mental status, Unsafe d/c plan and Inpatient level of care appropriate due to severity of illness  Dispo: The patient is from: Home              Anticipated d/c is to: Home              Anticipated d/c date is: > 3 days              Patient currently is not medically stable to d/c.       Consultants:   Critical Care  Procedures:     Antimicrobials: Anti-infectives (From admission, onward)   None      Subjective: Reports feeling better this AM  Objective: Vitals:   10/19/20 0820 10/19/20 0822 10/19/20 1209 10/19/20 1247  BP:    129/67  Pulse:    98  Resp:    (!) 22  Temp: 97.8 F (36.6 C)  98.4 F (36.9 C) 98.9 F (37.2 C)  TempSrc: Oral  Oral Oral  SpO2:  97%  97%  Weight:      Height:        Intake/Output Summary (Last 24 hours) at 10/19/2020 1628 Last data filed at 10/19/2020 1400 Gross per 24 hour  Intake 720 ml  Output 3300 ml  Net -2580 ml   Filed Weights   10/16/20 1319 10/17/20 0900 10/18/20 0500  Weight: (!) 171.5 kg (!) 172.3 kg (!) 168.1 kg    Examination: General exam:  Conversant, in no acute distress Respiratory system: normal chest rise, clear, no audible wheezing Cardiovascular system: regular rhythm, s1-s2 Gastrointestinal system: Nondistended, nontender, pos BS Central nervous system: No seizures, no tremors Extremities: No cyanosis, no joint deformities Skin: No rashes, no pallor Psychiatry: Affect normal // no auditory hallucinations   Data Reviewed: I have personally reviewed following labs and imaging studies  CBC: Recent Labs  Lab 10/16/20 1358 10/17/20 0424 10/17/20 0816 10/18/20 0250 10/19/20 0242  WBC 21.0* 23.1*  --  20.6*  19.2*  NEUTROABS 15.6*  --   --   --   --   HGB 15.6 16.2 18.0* 16.1 15.5  HCT 52.2* 55.2* 53.0* 56.0* 51.7  MCV 100.6* 104.2*  --  103.5* 100.4*  PLT 304 300  --  253 268   Basic Metabolic Panel: Recent Labs  Lab 10/17/20 0424 10/17/20 0424 10/17/20 0816 10/17/20 2050 10/18/20 0250 10/18/20 2032 10/19/20 0242  NA 140   < > 142 142 142 142 140  K 5.0   < > 5.3* 6.0* 5.5* 4.6 4.7  CL 94*  --   --  94* 92* 90* 89*  CO2 34*  --   --  38* 40* 40* 38*  GLUCOSE 143*  --   --  100* 105* 94 108*  BUN 24*  --   --  28* 26* 29* 32*  CREATININE 1.01  --   --  1.11 1.14 1.11 1.21  CALCIUM 8.7*  --   --  8.6* 8.9 8.9 8.7*  MG 2.6*  --   --   --   --   --   --    < > = values in this interval not displayed.   GFR: Estimated Creatinine Clearance: 125.9 mL/min (by C-G formula based on SCr of 1.21 mg/dL). Liver Function Tests: Recent Labs  Lab 10/16/20 1358  AST 25  ALT 39  ALKPHOS 57  BILITOT 1.8*  PROT 7.4  ALBUMIN 3.6   No results for input(s): LIPASE, AMYLASE in the last 168 hours. No results for input(s): AMMONIA in the last 168 hours. Coagulation Profile: No results for input(s): INR, PROTIME in the last 168 hours. Cardiac Enzymes: No results for input(s): CKTOTAL, CKMB, CKMBINDEX, TROPONINI in the last 168 hours. BNP (last 3 results) No results for input(s): PROBNP in the last 8760 hours. HbA1C: No results for input(s): HGBA1C in the last 72 hours. CBG: Recent Labs  Lab 10/18/20 0414 10/18/20 0801 10/18/20 1651 10/19/20 0812 10/19/20 1137  GLUCAP 92 84 96 93 113*   Lipid Profile: No results for input(s): CHOL, HDL, LDLCALC, TRIG, CHOLHDL, LDLDIRECT in the last 72 hours. Thyroid Function Tests: No results for input(s): TSH, T4TOTAL, FREET4, T3FREE, THYROIDAB in the last 72 hours. Anemia Panel: Recent Labs    10/17/20 0424  VITAMINB12 326   Sepsis Labs: No results for input(s): PROCALCITON, LATICACIDVEN in the last 168 hours.  Recent Results (from the  past 240 hour(s))  Respiratory Panel by RT PCR (Flu A&B, Covid) -     Status: None   Collection Time: 10/16/20  2:35 PM  Result Value Ref Range Status   SARS Coronavirus 2 by RT PCR NEGATIVE NEGATIVE Final    Comment: (NOTE) SARS-CoV-2 target nucleic acids are NOT DETECTED.  The SARS-CoV-2 RNA is generally detectable in upper respiratoy specimens during the acute phase of infection. The lowest concentration of SARS-CoV-2 viral copies this assay can detect is 131 copies/mL. A negative result does not preclude SARS-Cov-2 infection and  should not be used as the sole basis for treatment or other patient management decisions. A negative result may occur with  improper specimen collection/handling, submission of specimen other than nasopharyngeal swab, presence of viral mutation(s) within the areas targeted by this assay, and inadequate number of viral copies (<131 copies/mL). A negative result must be combined with clinical observations, patient history, and epidemiological information. The expected result is Negative.  Fact Sheet for Patients:  https://www.moore.com/  Fact Sheet for Healthcare Providers:  https://www.young.biz/  This test is no t yet approved or cleared by the [place] FDA and  has been authorized for detection and/or diagnosis of SARS-CoV-2 by FDA under an Emergency Use Authorization (EUA). This EUA will remain  in effect (meaning this test can be used) for the duration of the COVID-19 declaration under Section 564(b)(1) of the Act, 21 U.S.C. section 360bbb-3(b)(1), unless the authorization is terminated or revoked sooner.     Influenza A by PCR NEGATIVE NEGATIVE Final   Influenza B by PCR NEGATIVE NEGATIVE Final    Comment: (NOTE) The Xpert Xpress SARS-CoV-2/FLU/RSV assay is intended as an aid in  the diagnosis of influenza from Nasopharyngeal swab specimens and  should not be used as a sole basis for treatment. Nasal  washings and  aspirates are unacceptable for Xpert Xpress SARS-CoV-2/FLU/RSV  testing.  Fact Sheet for Patients: https://www.moore.com/  Fact Sheet for Healthcare Providers: https://www.young.biz/  This test is not yet approved or cleared by the [place] FDA and  has been authorized for detection and/or diagnosis of SARS-CoV-2 by  FDA under an Emergency Use Authorization (EUA). This EUA will remain  in effect (meaning this test can be used) for the duration of the  Covid-19 declaration under Section 564(b)(1) of the Act, 21  U.S.C. section 360bbb-3(b)(1), unless the authorization is  terminated or revoked. Performed at [facility], [address] [place], [place] [address]   MRSA PCR Screening     Status: None   Collection Time: 10/18/20  9:00 AM   Specimen: Nasopharyngeal  Result Value Ref Range Status   MRSA by PCR NEGATIVE NEGATIVE Final    Comment:        The GeneXpert MRSA Assay (FDA approved for NASAL specimens only), is one component of a comprehensive MRSA colonization surveillance program. It is not intended to diagnose MRSA infection nor to guide or monitor treatment for MRSA infections. Performed at [facility], [address], [place], [place] [address]      Radiology Studies: DG Chest 1 View  Result Date: 10/18/2020 CLINICAL DATA:  Obstructive sleep apnea EXAM: CHEST  1 VIEW COMPARISON:  10/16/2020 FINDINGS: Shallow inspiration. Developing infiltration or atelectasis in the left lung base. Cardiac enlargement. No pleural effusions. No pneumothorax. Mediastinal contours appear intact. IMPRESSION: Developing infiltration or atelectasis in the left lung base. Electronically Signed   By: Burman Nieves M.D.   On: 10/18/2020 05:44   DG CHEST PORT 1 VIEW  Result Date: 10/19/2020 CLINICAL DATA:  Respiratory failure EXAM: PORTABLE CHEST 1 VIEW COMPARISON:  10/18/2020 FINDINGS:  Shallow inspiration. Heart size is enlarged. Linear atelectasis in the left lung base. No pleural effusions. No pneumothorax. IMPRESSION: Shallow inspiration with linear atelectasis in the left lung base. Electronically Signed   By: Burman Nieves M.D.   On: 10/19/2020 06:14    Scheduled Meds: . atorvastatin  40 mg Oral Daily  . carvedilol  6.25 mg Oral BID  . chlorhexidine  15 mL Mouth Rinse BID  .  Chlorhexidine Gluconate Cloth  6 each Topical Daily  . enoxaparin (LOVENOX) injection  80 mg Subcutaneous Q24H  . furosemide  40 mg Intravenous Q12H  . hydrALAZINE  50 mg Oral BID  . mouth rinse  15 mL Mouth Rinse BID  . mometasone-formoterol  2 puff Inhalation BID  . nicotine  14 mg Transdermal Q24H  . pantoprazole (PROTONIX) IV  40 mg Intravenous QHS  . sodium chloride flush  3 mL Intravenous Q12H   Continuous Infusions:   LOS: 2 days   Rickey Barbara, MD [facility] Pager On Amion  If 7PM-7AM, please contact night-coverage 10/19/2020, 4:28 PM

## 2020-10-19 NOTE — Progress Notes (Addendum)
NAME:  Joseph Morales, MRN:  [id number], DOB:  1971-03-04, LOS: 2 ADMISSION DATE:  10/16/2020, CONSULTATION DATE: 10/17/20 REFERRING MD:  Dr. Rhona Leavens, CHIEF COMPLAINT:  AMS   Brief History   49 y/o M admitted 10/19 with reports of increasing SOB, DOE, orthopnea and LE swelling thought secondary to decompensated diastolic CHF (LVEF 70-75% 05/2020) , OSA/OHS.    Past Medical History  Obesity  Diastolic CHF - LVEF 70-75% Ashtma  Former Smoker - 10pk yr hx HTN HLD  OHS  Significant Hospital Events   10/19 Admit  10/20 Tx to ICU for hypercarbic   Consults:    Procedures:    Significant Diagnostic Tests:  10/19 CTA Chest >> negative for PE, airway inflammation with mild bronchial wall thickening, small hiatal hernia, non-specific perinephric stranding within the visualized abdomen  Micro Data:  COVID 10/19 >> negative  Influenza A/B 10/19 >> negative  UA 10/20 >> negative   Antimicrobials:    Interim history/subjective:  RN reports pt intermittently wore bipap overnight  Tmax 99.3 I/O 5.3L UOP, -4.8L in last 24 hours  Pt reports he urinated on himself  Objective   Blood pressure (!) 139/55, pulse 84, temperature 99.3 F (37.4 C), temperature source Oral, resp. rate (!) 21, height 6\' 3"  (1.905 m), weight (!) 168.1 kg, SpO2 95 %.    FiO2 (%):  [35 %] 35 %   Intake/Output Summary (Last 24 hours) at 10/19/2020 0803 Last data filed at 10/19/2020 0500 Gross per 24 hour  Intake 480 ml  Output 4900 ml  Net -4420 ml   Filed Weights   10/16/20 1319 10/17/20 0900 10/18/20 0500  Weight: (!) 171.5 kg (!) 172.3 kg (!) 168.1 kg    Examination: General: adult male lying in bed in NAD  HEENT: MM pink/moist, [place] O2 Neuro: Awakens to voice, alert, oriented to self, place, time but has difficulty finding words, speech clear, sleepy, MAE CV: s1s2 RRR, no m/r/g PULM: non-labored on [place] O2, lungs bilaterally clear GI: soft, bsx4 active  Extremities: warm/dry, 1+ pre-tibial edema    Skin: no rashes or lesions  Resolved Hospital Problem list     Assessment & Plan:   Acute on Chronic Hypoxic / Hypercarbic Respiratory Failure  Suspected OHS / OSA  In setting of suspected non-compliance / decompensated dCHF & OHS/OSA.  Pt reports he has a CPAP at home, ??? Not compliant and do not see a sleep study in the system -QHS mandatory BiPAP and PRN daytime sleep  -follow mentation, if drowsy > put on bipap -monitor intermittent CXR  -continue diuresis  -O2 for sats 88-95% -pulmonary hygiene -IS, flutter  -educate patient on importance of mask compliance, and long term consequences of OSA/OHS -order home trilogy   This patient requires non-invasive ventilator for acute in chronic respiratory failure and COPD. Without ventilator, there is significant risk of untimely readmission to the hospital and increase on CO2 retention. BIPAP is ineffective in the home setting in reducing CO2 due to the lack of an auto-rate.   Acute Decompensation of Diastolic CHF -reduce lasix to 40 mg BID with rise in Sr Cr  -monitor electrolytes  -strict I/O's  Asthma without Exacerbation  -Dulera, Duoneb PRN   Tobacco Abuse  1ppd habit  -nicotine patch   Leukocytosis  UA negative.  -pan culture if fever > 101.5  -follow fever curve / WBC trend   HTN, HLD -continue hyydralazine, atorvastatin, increased dose coreg -hold losartan with hyperkalemia   Small Hiatal Hernia  Noted  on CT scan 10/19  -PPI   Social Issues  Patient lives with Mom.  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  Does not have insurance. TOC consulted.   Best practice:  Diet: NPO Pain/Anxiety/Delirium protocol (if indicated): n/a VAP protocol (if indicated): n/a DVT prophylaxis: lovenox  GI prophylaxis: PPI  Glucose control: per primary  Mobility: BR, mobilize as tolerated  Code Status: Full Code  Family Communication: Patient updated on  plan of care.  Disposition: SDU, final disposition per [facility].  PCCM will be available PRN.   Labs   CBC: Recent Labs  Lab 10/16/20 1358 10/17/20 0424 10/17/20 0816 10/18/20 0250 10/19/20 0242  WBC 21.0* 23.1*  --  20.6* 19.2*  NEUTROABS 15.6*  --   --   --   --   HGB 15.6 16.2 18.0* 16.1 15.5  HCT 52.2* 55.2* 53.0* 56.0* 51.7  MCV 100.6* 104.2*  --  103.5* 100.4*  PLT 304 300  --  253 268    Basic Metabolic Panel: Recent Labs  Lab 10/17/20 0424 10/17/20 0424 10/17/20 0816 10/17/20 2050 10/18/20 0250 10/18/20 2032 10/19/20 0242  NA 140   < > 142 142 142 142 140  K 5.0   < > 5.3* 6.0* 5.5* 4.6 4.7  CL 94*  --   --  94* 92* 90* 89*  CO2 34*  --   --  38* 40* 40* 38*  GLUCOSE 143*  --   --  100* 105* 94 108*  BUN 24*  --   --  28* 26* 29* 32*  CREATININE 1.01  --   --  1.11 1.14 1.11 1.21  CALCIUM 8.7*  --   --  8.6* 8.9 8.9 8.7*  MG 2.6*  --   --   --   --   --   --    < > = values in this interval not displayed.   GFR: Estimated Creatinine Clearance: 125.9 mL/min (by C-G formula based on SCr of 1.21 mg/dL). Recent Labs  Lab 10/16/20 1358 10/17/20 0424 10/18/20 0250 10/19/20 0242  WBC 21.0* 23.1* 20.6* 19.2*    Liver Function Tests: Recent Labs  Lab 10/16/20 1358  AST 25  ALT 39  ALKPHOS 57  BILITOT 1.8*  PROT 7.4  ALBUMIN 3.6   No results for input(s): LIPASE, AMYLASE in the last 168 hours. No results for input(s): AMMONIA in the last 168 hours.  ABG    Component Value Date/Time   PHART 7.158 (LL) 10/17/2020 1327   PCO2ART >120 (HH) 10/17/2020 1327   PO2ART 89.8 10/17/2020 1327   HCO3 41.9 (H) 10/17/2020 1327   O2SAT 95.5 10/17/2020 1327     Coagulation Profile: No results for input(s): INR, PROTIME in the last 168 hours.  Cardiac Enzymes: No results for input(s): CKTOTAL, CKMB, CKMBINDEX, TROPONINI in the last 168 hours.  HbA1C: Hgb A1c MFr Bld  Date/Time Value Ref Range Status  06/23/2020 05:02 AM 5.9 (H) 4.8 - 5.6 % Final     Comment:    (NOTE) Pre diabetes:          5.7%-6.4%  Diabetes:              >6.4%  Glycemic control for   <7.0% adults with diabetes     CBG: Recent Labs  Lab 10/17/20 1949 10/17/20 2341 10/18/20 0414 10/18/20 0801 10/18/20 1651  GLUCAP 105* 103* 92 84 96  Critical care time: n/a        Canary Brim, MSN, NP-C, AGACNP-BC [place] 10/19/2020, 8:03 AM   Please see [email or web address] for pager details.

## 2020-10-20 LAB — COMPREHENSIVE METABOLIC PANEL
ALT: 23 U/L (ref 0–44)
AST: 22 U/L (ref 15–41)
Albumin: 3.5 g/dL (ref 3.5–5.0)
Alkaline Phosphatase: 54 U/L (ref 38–126)
Anion gap: 8 (ref 5–15)
BUN: 29 mg/dL — ABNORMAL HIGH (ref 6–20)
CO2: 43 mmol/L — ABNORMAL HIGH (ref 22–32)
Calcium: 8.6 mg/dL — ABNORMAL LOW (ref 8.9–10.3)
Chloride: 86 mmol/L — ABNORMAL LOW (ref 98–111)
Creatinine, Ser: 1.13 mg/dL (ref 0.61–1.24)
GFR, Estimated: >60 mL/min (ref >60)
Glucose, Bld: 137 mg/dL — ABNORMAL HIGH (ref 70–99)
Potassium: 4.2 mmol/L (ref 3.5–5.1)
Sodium: 137 mmol/L (ref 135–145)
Total Bilirubin: 3.3 mg/dL — ABNORMAL HIGH (ref 0.3–1.2)
Total Protein: 6.8 g/dL (ref 6.5–8.1)

## 2020-10-20 LAB — CBC
HCT: 51.4 % (ref 39.0–52.0)
Hemoglobin: 15.5 g/dL (ref 13.0–17.0)
MCH: 30 pg (ref 26.0–34.0)
MCHC: 30.2 g/dL (ref 30.0–36.0)
MCV: 99.4 fL (ref 80.0–100.0)
Platelets: 249 10*3/uL (ref 150–400)
RBC: 5.17 MIL/uL (ref 4.22–5.81)
RDW: 13.4 % (ref 11.5–15.5)
WBC: 14.3 10*3/uL — ABNORMAL HIGH (ref 4.0–10.5)
nRBC: 0 % (ref 0.0–0.2)

## 2020-10-20 LAB — GLUCOSE, CAPILLARY: Glucose-Capillary: 148 mg/dL — ABNORMAL HIGH (ref 70–99)

## 2020-10-20 NOTE — Plan of Care (Addendum)
-   Placed on BIPAP as ordered from 0000 to 0430, pt woke up in between to drink water and ambulate to the bathroom, pt occasionally will adjust mask and leaks noted. All BIPAP alarms issues addressed.  Problem: Education: Goal: Knowledge of General Education information will improve Description: Including pain rating scale, medication(s)/side effects and non-pharmacologic comfort measures Outcome: Progressing   Problem: Activity: Goal: Risk for activity intolerance will decrease Outcome: Progressing   Problem: Nutrition: Goal: Adequate nutrition will be maintained Outcome: Progressing   Problem: Elimination: Goal: Will not experience complications related to bowel motility Outcome: Progressing Goal: Will not experience complications related to urinary retention Outcome: Progressing   Problem: Pain Managment: Goal: General experience of comfort will improve Outcome: Progressing   Problem: Safety: Goal: Ability to remain free from injury will improve Outcome: Progressing   Problem: Skin Integrity: Goal: Risk for impaired skin integrity will decrease Outcome: Progressing

## 2020-10-20 NOTE — Progress Notes (Addendum)
-   On BIPAP from 0000-0315, awake and refused to be on BIPAP, education provided and pt gets irritable. Placed back on 4lpm [place].  - Placed back on Bipap at 0345

## 2020-10-20 NOTE — Progress Notes (Signed)
PROGRESS NOTE    Joseph Morales  XLK:[id number] DOB: [birth year] DOA: 10/16/2020 PCP: Patient, No Pcp Per    Brief Narrative:  49 y.o. male with medical history significant for chronic diastolic CHF (EF 36-64% By TTE 06/22/2020), asthma, HTN, HLD, tobacco use, and suspected OHS who presents to the ED for evaluation of progressive dyspnea and lower extremity swelling.  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 Currently remains at [facility]  Asthma: -Continue Dulera and as needed albuterol. -No wheezing on exam this AM with pt continued on bipap while sleeping  Leukocytosis: -No obvious evidence of infection noted -WBC continues to trend down, currently 14k  Hypertension: -Pt on home Coreg, losartan, hydralazine. -BP remains stable at this time  Hyperlipidemia: -Would continue home atorvastatin as pt tolerates  Tobacco use: -Pt reportedly usually smoking 1 pack/day, reportedly has not smoked the last 2 weeks. -cessation advised  Morbid obesity -Recommend  diet/lifestyle modification  DVT prophylaxis: Lovenox subq Code Status: Full Family Communication: Pt in room, family not at bedside  Status is: Inpatient  The patient will require inpatient stay because: Hemodynamically unstable, Altered mental status, Unsafe d/c plan and Inpatient level of care appropriate due to severity of illness  Dispo: The patient is from: Home              Anticipated d/c is to: Home              Anticipated d/c date is: > 3 days              Patient currently is not medically stable to d/c.       Consultants:   Critical Care  Procedures:     Antimicrobials: Anti-infectives (From admission, onward)   None      Subjective: Without complaints this AM. Seen as pt was waking up still wearing bipap  Objective: Vitals:   10/20/20 0432 10/20/20 0506 10/20/20 0809 10/20/20 1321  BP:  117/62  (!) 117/56  Pulse:  89  89  Resp: (!) 25 (!) 22  17  Temp:  98 F (36.7 C)  98.5 F (36.9 C)  TempSrc:  Oral  Oral  SpO2:  95% 94% 97%  Weight:  (!) 165.2 kg    Height:        Intake/Output Summary (Last 24 hours) at 10/20/2020 1710 Last data filed at 10/20/2020 1400 Gross per 24 hour  Intake 1800 ml  Output 2675 ml  Net -875 ml   Filed Weights   10/17/20 0900 10/18/20 0500 10/20/20 0506  Weight: (!) 172.3 kg (!) 168.1 kg (!) 165.2 kg    Examination: General exam: Asleep,  arousable, laying in bed, bipap in place Respiratory system: Normal respiratory effort, no wheezing, bipap in place Cardiovascular system: regular rate, s1, s2 Gastrointestinal system: Soft, nondistended, positive BS, morbidly obese Central nervous system: CN2-12 grossly intact, strength intact Extremities: Perfused, no clubbing Skin: Normal skin turgor, no notable skin lesions seen Psychiatry: Mood normal // no visual hallucinations   Data Reviewed: I have personally reviewed following labs and imaging studies  CBC: Recent Labs  Lab 10/16/20 1358 10/16/20 1358  10/17/20 0424 10/17/20 0816 10/18/20 0250 10/19/20 0242 10/20/20 0816  WBC 21.0*  --  23.1*  --  20.6* 19.2* 14.3*  NEUTROABS 15.6*  --   --   --   --   --   --   HGB 15.6   < > 16.2 18.0* 16.1 15.5 15.5  HCT 52.2*   < > 55.2* 53.0* 56.0* 51.7 51.4  MCV 100.6*  --  104.2*  --  103.5* 100.4* 99.4  PLT 304  --  300  --  253 268 249   < > = values in this interval not displayed.   Basic Metabolic Panel: Recent Labs  Lab 10/17/20 0424 10/17/20 0816 10/17/20 2050 10/18/20 0250 10/18/20 2032 10/19/20 0242 10/20/20 0816  NA 140   < > 142 142 142 140 137  K 5.0   < > 6.0* 5.5* 4.6 4.7 4.2  CL 94*   < > 94* 92* 90* 89* 86*  CO2 34*   < > 38* 40* 40* 38* 43*  GLUCOSE 143*   < > 100* 105* 94 108* 137*  BUN 24*   < > 28* 26* 29* 32* 29*  CREATININE 1.01   < > 1.11 1.14 1.11 1.21 1.13  CALCIUM 8.7*   < > 8.6* 8.9 8.9 8.7* 8.6*  MG 2.6*  --   --   --   --   --   --    < > = values in this interval not displayed.   GFR: Estimated Creatinine Clearance: 133.5 mL/min (by C-G formula based on SCr of 1.13 mg/dL). Liver Function Tests: Recent Labs  Lab 10/16/20 1358 10/20/20 0816  AST 25 22  ALT 39 23  ALKPHOS 57 54  BILITOT 1.8* 3.3*  PROT 7.4 6.8  ALBUMIN 3.6 3.5   No results for input(s): LIPASE, AMYLASE in the last 168 hours. No results for input(s): AMMONIA in the last 168 hours. Coagulation Profile: No results for input(s): INR, PROTIME in the last 168 hours. Cardiac Enzymes: No results for input(s): CKTOTAL, CKMB, CKMBINDEX, TROPONINI in the last 168 hours. BNP (last 3 results) No results for input(s): PROBNP in the last 8760 hours. HbA1C: No results for input(s): HGBA1C in the last 72 hours. CBG: Recent Labs  Lab 10/18/20 0801 10/18/20 1651 10/19/20 0812 10/19/20 1137 10/20/20 0749  GLUCAP 84 96 93 113* 148*   Lipid Profile: No results for input(s): CHOL, HDL, LDLCALC, TRIG, CHOLHDL, LDLDIRECT in the last 72 hours. Thyroid Function Tests: No results for  input(s): TSH, T4TOTAL, FREET4, T3FREE, THYROIDAB in the last 72 hours. Anemia Panel: No results for input(s): VITAMINB12, FOLATE, FERRITIN, TIBC, IRON, RETICCTPCT in the last 72 hours. Sepsis Labs: No results for input(s): PROCALCITON, LATICACIDVEN in the last 168 hours.  Recent Results (from the past 240 hour(s))  Respiratory Panel by RT PCR (Flu A&B, Covid) -     Status: None   Collection Time: 10/16/20  2:35 PM  Result Value Ref Range Status   SARS Coronavirus 2 by  RT PCR NEGATIVE NEGATIVE Final    Comment: (NOTE) SARS-CoV-2 target nucleic acids are NOT DETECTED.  The SARS-CoV-2 RNA is generally detectable in upper respiratoy specimens during the acute phase of infection. The lowest concentration of SARS-CoV-2 viral copies this assay can detect is 131 copies/mL. A negative result does not preclude SARS-Cov-2 infection and should not be used as the sole basis for treatment or other patient management decisions. A negative result may occur with  improper specimen collection/handling, submission of specimen other than nasopharyngeal swab, presence of viral mutation(s) within the areas targeted by this assay, and inadequate number of viral copies (<131 copies/mL). A negative result must be combined with clinical observations, patient history, and epidemiological information. The expected result is Negative.  Fact Sheet for Patients:  https://www.moore.com/  Fact Sheet for Healthcare Providers:  https://www.young.biz/  This test is no t yet approved or cleared by the [place] FDA and  has been authorized for detection and/or diagnosis of SARS-CoV-2 by FDA under an Emergency Use Authorization (EUA). This EUA will remain  in effect (meaning this test can be used) for the duration of the COVID-19 declaration under Section 564(b)(1) of the Act, 21 U.S.C. section 360bbb-3(b)(1), unless the authorization is terminated or revoked sooner.       Influenza A by PCR NEGATIVE NEGATIVE Final   Influenza B by PCR NEGATIVE NEGATIVE Final    Comment: (NOTE) The Xpert Xpress SARS-CoV-2/FLU/RSV assay is intended as an aid in  the diagnosis of influenza from Nasopharyngeal swab specimens and  should not be used as a sole basis for treatment. Nasal washings and  aspirates are unacceptable for Xpert Xpress SARS-CoV-2/FLU/RSV  testing.  Fact Sheet for Patients: https://www.moore.com/  Fact Sheet for Healthcare Providers: https://www.young.biz/  This test is not yet approved or cleared by the [place] FDA and  has been authorized for detection and/or diagnosis of SARS-CoV-2 by  FDA under an Emergency Use Authorization (EUA). This EUA will remain  in effect (meaning this test can be used) for the duration of the  Covid-19 declaration under Section 564(b)(1) of the Act, 21  U.S.C. section 360bbb-3(b)(1), unless the authorization is  terminated or revoked. Performed at [facility] [address], [place], [place] [address]   MRSA PCR Screening     Status: None   Collection Time: 10/18/20  9:00 AM   Specimen: Nasopharyngeal  Result Value Ref Range Status   MRSA by PCR NEGATIVE NEGATIVE Final    Comment:        The GeneXpert MRSA Assay (FDA approved for NASAL specimens only), is one component of a comprehensive MRSA colonization surveillance program. It is not intended to diagnose MRSA infection nor to guide or monitor treatment for MRSA infections. Performed at [facility], [address] [place], [place] [address]      Radiology Studies: DG CHEST PORT 1 VIEW  Result Date: 10/19/2020 CLINICAL DATA:  Respiratory failure EXAM: PORTABLE CHEST 1 VIEW COMPARISON:  10/18/2020 FINDINGS: Shallow inspiration. Heart size is enlarged. Linear atelectasis in the left lung base. No pleural effusions. No pneumothorax. IMPRESSION: Shallow inspiration with  linear atelectasis in the left lung base. Electronically Signed   By: Burman Nieves M.D.   On: 10/19/2020 06:14    Scheduled Meds: . atorvastatin  40 mg Oral Daily  . carvedilol  6.25 mg Oral BID  . chlorhexidine  15 mL Mouth Rinse BID  . Chlorhexidine Gluconate Cloth  6 each Topical Daily  . enoxaparin (  LOVENOX) injection  80 mg Subcutaneous Q24H  . furosemide  40 mg Intravenous Q12H  . hydrALAZINE  50 mg Oral BID  . mouth rinse  15 mL Mouth Rinse BID  . mometasone-formoterol  2 puff Inhalation BID  . nicotine  14 mg Transdermal Q24H  . pantoprazole (PROTONIX) IV  40 mg Intravenous QHS  . sodium chloride flush  3 mL Intravenous Q12H   Continuous Infusions:   LOS: 3 days   Rickey Barbara, MD [facility] Pager On Amion  If 7PM-7AM, please contact night-coverage 10/20/2020, 5:10 PM

## 2020-10-20 NOTE — Plan of Care (Signed)

## 2020-10-21 MED ORDER — PANTOPRAZOLE SODIUM 40 MG PO TBEC
40.0000 mg | DELAYED_RELEASE_TABLET | Freq: Every day | ORAL | Status: DC
  Administered 2020-10-21 – 2020-10-25 (×5): 40 mg via ORAL
  Filled 2020-10-21 (×5): qty 1

## 2020-10-21 NOTE — Progress Notes (Signed)
PROGRESS NOTE    Joseph Morales  VXB:[id number] DOB: [birth year] DOA: 10/16/2020 PCP: Patient, No Pcp Per    Brief Narrative:  49 y.o. male with medical history significant for chronic diastolic CHF (EF 33-00% By TTE 06/22/2020), asthma, HTN, HLD, tobacco use, and suspected OHS who presents to the ED for evaluation of progressive dyspnea and lower extremity swelling.  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 -Recent admit for acute diastolic chf exacerbation ultimately improved with IV lasix diuresis -Most recent EF of 70-75% noted in 6/21 -On baseline 2LNC prior to admit, shortly after admit, O2 requirements worsened dramatically with pt developing hypercarbic failure, requiring ICU transfer -Since transfer to ICU, pt was continued on PPV with improvement in hypercarbic failure -Pt now alert and conversant, transfer of care back to [facility] on 10/22 - Pt was recently educated on the importance of home CPAP -Continue diuresis as tolerated with IV BID lasix -Continue to wean O2 as tolerated. Currently remains at [facility] -Of note, total intake over past 24hrs noted to be 3.36L, only 2.48L out (net pos 888cc). -Will set 1.5L fluid restriction. Will consult dietitian regarding proper CHF diet  Asthma: -Continue Dulera and as needed albuterol. -Without wheezing on exam this AM with pt continued on bipap while sleeping  Leukocytosis: -No obvious evidence of infection noted -WBC had been trending down, most recently 14k  Hypertension: -Pt on home Coreg, losartan, hydralazine. -BP remains stable at this time,  reviewed  Hyperlipidemia: -Would continue home atorvastatin as pt tolerates  Tobacco use: -Pt reportedly usually smoking 1 pack/day, reportedly has not smoked the last 2 weeks. -cessation advised  Morbid obesity -Recommend diet/lifestyle modification  DVT prophylaxis: Lovenox subq Code Status: Full Family Communication: Pt in room, family not at bedside  Status is: Inpatient  The patient will require inpatient stay because: Hemodynamically unstable, Altered mental status, Unsafe d/c plan and Inpatient level of care appropriate due to severity of illness  Dispo: The patient is from: Home              Anticipated d/c is to: Home              Anticipated d/c date is: > 3 days              Patient currently is not medically stable to d/c.  Consultants:   Critical Care  Procedures:     Antimicrobials: Anti-infectives (From admission, onward)   None      Subjective: Without complaints this AM.  Objective: Vitals:   10/20/20 2100 10/21/20 0538 10/21/20 0802 10/21/20 1357  BP: 131/77 (!) 125/58  136/73  Pulse: (!) 101 89  90  Resp: 20 20  14   Temp: 99.8 F (37.7 C) 98.1 F (36.7 C)  98.7 F (37.1 C)  TempSrc: Oral Oral  Oral  SpO2: 93% 96% (!) 86% 95%  Weight:  (!) 164.1 kg    Height:        Intake/Output Summary (Last 24 hours) at 10/21/2020 1521 Last data filed at 10/21/2020 1300 Gross per 24 hour  Intake 2620 ml  Output 4000 ml  Net -1380 ml   Filed Weights   10/18/20  0500 10/20/20 0506 10/21/20 0538  Weight: (!) 168.1 kg (!) 165.2 kg (!) 164.1 kg    Examination: General exam: Awake, laying in bed, in nad Respiratory system: Increased respiratory effort, no wheezing, decreased BS Cardiovascular system: regular rate, s1, s2 Gastrointestinal system: Soft, nondistended, positive BS Central nervous system: CN2-12 grossly intact, strength intact Extremities: Perfused, no clubbing, anasarca Skin: Normal skin turgor, no notable skin lesions  seen Psychiatry: Mood normal // no visual hallucinations    Data Reviewed: I have personally reviewed following labs and imaging studies  CBC: Recent Labs  Lab 10/16/20 1358 10/16/20 1358 10/17/20 0424 10/17/20 0816 10/18/20 0250 10/19/20 0242 10/20/20 0816  WBC 21.0*  --  23.1*  --  20.6* 19.2* 14.3*  NEUTROABS 15.6*  --   --   --   --   --   --   HGB 15.6   < > 16.2 18.0* 16.1 15.5 15.5  HCT 52.2*   < > 55.2* 53.0* 56.0* 51.7 51.4  MCV 100.6*  --  104.2*  --  103.5* 100.4* 99.4  PLT 304  --  300  --  253 268 249   < > = values in this interval not displayed.   Basic Metabolic Panel: Recent Labs  Lab 10/17/20 0424 10/17/20 0816 10/17/20 2050 10/18/20 0250 10/18/20 2032 10/19/20 0242 10/20/20 0816  NA 140   < > 142 142 142 140 137  K 5.0   < > 6.0* 5.5* 4.6 4.7 4.2  CL 94*   < > 94* 92* 90* 89* 86*  CO2 34*   < > 38* 40* 40* 38* 43*  GLUCOSE 143*   < > 100* 105* 94 108* 137*  BUN 24*   < > 28* 26* 29* 32* 29*  CREATININE 1.01   < > 1.11 1.14 1.11 1.21 1.13  CALCIUM 8.7*   < > 8.6* 8.9 8.9 8.7* 8.6*  MG 2.6*  --   --   --   --   --   --    < > = values in this interval not displayed.   GFR: Estimated Creatinine Clearance: 132.9 mL/min (by C-G formula based on SCr of 1.13 mg/dL). Liver Function Tests: Recent Labs  Lab 10/16/20 1358 10/20/20 0816  AST 25 22  ALT 39 23  ALKPHOS 57 54  BILITOT 1.8* 3.3*  PROT 7.4 6.8  ALBUMIN 3.6 3.5   No results for input(s): LIPASE, AMYLASE in the last 168 hours. No results for input(s): AMMONIA in the last 168 hours. Coagulation Profile: No results for input(s): INR, PROTIME in the last 168 hours. Cardiac Enzymes: No results for input(s): CKTOTAL, CKMB, CKMBINDEX, TROPONINI in the last 168 hours. BNP (last 3 results) No results for input(s): PROBNP in the last 8760 hours. HbA1C: No results for input(s): HGBA1C in the last 72 hours. CBG: Recent Labs  Lab 10/18/20 0801 10/18/20 1651 10/19/20 0812 10/19/20 1137  10/20/20 0749  GLUCAP 84 96 93 113* 148*   Lipid Profile: No results for input(s): CHOL, HDL, LDLCALC, TRIG, CHOLHDL, LDLDIRECT in the last 72 hours. Thyroid Function Tests: No results for input(s): TSH, T4TOTAL, FREET4, T3FREE, THYROIDAB in the last 72 hours. Anemia Panel: No results for input(s): VITAMINB12, FOLATE, FERRITIN, TIBC, IRON, RETICCTPCT in the last 72 hours. Sepsis Labs: No results for input(s): PROCALCITON, LATICACIDVEN in the last 168 hours.  Recent Results (from the past 240 hour(s))  Respiratory Panel by RT PCR (Flu A&B, Covid) -     Status: None  Collection Time: 10/16/20  2:35 PM  Result Value Ref Range Status   SARS Coronavirus 2 by RT PCR NEGATIVE NEGATIVE Final    Comment: (NOTE) SARS-CoV-2 target nucleic acids are NOT DETECTED.  The SARS-CoV-2 RNA is generally detectable in upper respiratoy specimens during the acute phase of infection. The lowest concentration of SARS-CoV-2 viral copies this assay can detect is 131 copies/mL. A negative result does not preclude SARS-Cov-2 infection and should not be used as the sole basis for treatment or other patient management decisions. A negative result may occur with  improper specimen collection/handling, submission of specimen other than nasopharyngeal swab, presence of viral mutation(s) within the areas targeted by this assay, and inadequate number of viral copies (<131 copies/mL). A negative result must be combined with clinical observations, patient history, and epidemiological information. The expected result is Negative.  Fact Sheet for Patients:  https://www.moore.com/  Fact Sheet for Healthcare Providers:  https://www.young.biz/  This test is no t yet approved or cleared by the [place] FDA and  has been authorized for detection and/or diagnosis of SARS-CoV-2 by FDA under an Emergency Use Authorization (EUA). This EUA will remain  in effect (meaning this  test can be used) for the duration of the COVID-19 declaration under Section 564(b)(1) of the Act, 21 U.S.C. section 360bbb-3(b)(1), unless the authorization is terminated or revoked sooner.     Influenza A by PCR NEGATIVE NEGATIVE Final   Influenza B by PCR NEGATIVE NEGATIVE Final    Comment: (NOTE) The Xpert Xpress SARS-CoV-2/FLU/RSV assay is intended as an aid in  the diagnosis of influenza from Nasopharyngeal swab specimens and  should not be used as a sole basis for treatment. Nasal washings and  aspirates are unacceptable for Xpert Xpress SARS-CoV-2/FLU/RSV  testing.  Fact Sheet for Patients: https://www.moore.com/  Fact Sheet for Healthcare Providers: https://www.young.biz/  This test is not yet approved or cleared by the [place] FDA and  has been authorized for detection and/or diagnosis of SARS-CoV-2 by  FDA under an Emergency Use Authorization (EUA). This EUA will remain  in effect (meaning this test can be used) for the duration of the  Covid-19 declaration under Section 564(b)(1) of the Act, 21  U.S.C. section 360bbb-3(b)(1), unless the authorization is  terminated or revoked. Performed at [facility], [address], [place], [place] [address]   MRSA PCR Screening     Status: None   Collection Time: 10/18/20  9:00 AM   Specimen: Nasopharyngeal  Result Value Ref Range Status   MRSA by PCR NEGATIVE NEGATIVE Final    Comment:        The GeneXpert MRSA Assay (FDA approved for NASAL specimens only), is one component of a comprehensive MRSA colonization surveillance program. It is not intended to diagnose MRSA infection nor to guide or monitor treatment for MRSA infections. Performed at [facility], [address] [place], [place] [address]      Radiology Studies: No results found.  Scheduled Meds:  atorvastatin  40 mg Oral Daily   carvedilol  6.25 mg Oral BID    chlorhexidine  15 mL Mouth Rinse BID   Chlorhexidine Gluconate Cloth  6 each Topical Daily   enoxaparin (LOVENOX) injection  80 mg Subcutaneous Q24H   furosemide  40 mg Intravenous Q12H   hydrALAZINE  50 mg Oral BID   mouth rinse  15 mL Mouth Rinse BID   mometasone-formoterol  2 puff Inhalation BID   nicotine  14 mg Transdermal  Q24H   pantoprazole  40 mg Oral Daily   sodium chloride flush  3 mL Intravenous Q12H   Continuous Infusions:   LOS: 4 days   Rickey Barbara, MD [facility] Pager On Amion  If 7PM-7AM, please contact night-coverage 10/21/2020, 3:21 PM

## 2020-10-21 NOTE — Progress Notes (Signed)
PHARMACIST - PHYSICIAN COMMUNICATION    CONCERNING: IV to Oral Route Change Policy  RECOMMENDATION: This patient is receiving pantoprazole by the intravenous route.  Based on criteria approved by the Pharmacy and Therapeutics Committee, the intravenous medication(s) is/are being converted to the equivalent oral dose form(s).   DESCRIPTION: These criteria include:  The patient is eating (either orally or via tube) and/or has been taking other orally administered medications for a least 24 hours  The patient has no evidence of active gastrointestinal bleeding or impaired GI absorption (gastrectomy, short bowel, patient on TNA or NPO).  If you have questions about this conversion, please contact the Pharmacy Department  []   [phone] )  ( [phone] []   [phone] )  [facility] []   [phone] )  [facility] [x]   [phone] )  [facility] []   [phone] )  [facility]     ( [phone], PharmD, BCPS 10/21/2020 11:41 AM

## 2020-10-22 LAB — COMPREHENSIVE METABOLIC PANEL
ALT: 56 U/L — ABNORMAL HIGH (ref 0–44)
AST: 62 U/L — ABNORMAL HIGH (ref 15–41)
Albumin: 3.6 g/dL (ref 3.5–5.0)
Alkaline Phosphatase: 53 U/L (ref 38–126)
Anion gap: 10 (ref 5–15)
BUN: 24 mg/dL — ABNORMAL HIGH (ref 6–20)
CO2: 44 mmol/L — ABNORMAL HIGH (ref 22–32)
Calcium: 9.2 mg/dL (ref 8.9–10.3)
Chloride: 84 mmol/L — ABNORMAL LOW (ref 98–111)
Creatinine, Ser: 0.98 mg/dL (ref 0.61–1.24)
GFR, Estimated: >60 mL/min (ref >60)
Glucose, Bld: 125 mg/dL — ABNORMAL HIGH (ref 70–99)
Potassium: 5.4 mmol/L — ABNORMAL HIGH (ref 3.5–5.1)
Sodium: 138 mmol/L (ref 135–145)
Total Bilirubin: 2.6 mg/dL — ABNORMAL HIGH (ref 0.3–1.2)
Total Protein: 7.1 g/dL (ref 6.5–8.1)

## 2020-10-22 LAB — CBC
HCT: 53.6 % — ABNORMAL HIGH (ref 39.0–52.0)
Hemoglobin: 15.9 g/dL (ref 13.0–17.0)
MCH: 29.6 pg (ref 26.0–34.0)
MCHC: 29.7 g/dL — ABNORMAL LOW (ref 30.0–36.0)
MCV: 99.8 fL (ref 80.0–100.0)
Platelets: 247 10*3/uL (ref 150–400)
RBC: 5.37 MIL/uL (ref 4.22–5.81)
RDW: 13 % (ref 11.5–15.5)
WBC: 15.1 10*3/uL — ABNORMAL HIGH (ref 4.0–10.5)
nRBC: 0 % (ref 0.0–0.2)

## 2020-10-22 LAB — MAGNESIUM: Magnesium: 2.4 mg/dL (ref 1.7–2.4)

## 2020-10-22 NOTE — Progress Notes (Addendum)
Confirmed follow up sleep MD evaluation in place, see discharge summary section for details. Home trilogy ordered last week.  Continue nocturnal BiPAP & PRN daytime sleep while inpatient. PCCM will be available PRN.  Please call with new needs.    Canary Brim, MSN, NP-C, AGACNP-BC [facility] 10/22/2020, 12:19 PM   Please see [email or web address] for pager details.

## 2020-10-22 NOTE — Progress Notes (Signed)
Pt. placed on BiPAP V-60(AVAPS) mode per current order, protective pad placed over pts. bridge of nose, per RN request who noted skin breakdown from BiPAP use, adjusted to minimal leak, RT to monitor, RN aware.

## 2020-10-22 NOTE — Plan of Care (Signed)
  Problem: Education: Goal: Knowledge of General Education information will improve Description: Including pain rating scale, medication(s)/side effects and non-pharmacologic comfort measures Outcome: Progressing   Problem: Nutrition: Goal: Adequate nutrition will be maintained Outcome: Progressing   

## 2020-10-22 NOTE — Progress Notes (Signed)
PROGRESS NOTE    Joseph Morales  JQZ:[id number] DOB: 1971-09-05 DOA: 10/16/2020 PCP: Patient, No Pcp Per    Brief Narrative:  49 y.o. male with medical history significant for chronic diastolic CHF (EF 62-26% By TTE 06/22/2020), asthma, HTN, HLD, tobacco use, and suspected OHS who presents to the ED for evaluation of progressive dyspnea and lower extremity swelling.  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 -Recent admit for acute diastolic chf exacerbation ultimately improved with IV lasix diuresis -Most recent EF of 70-75% noted in 6/21 -On baseline 2LNC prior to admit, shortly after admit, O2 requirements worsened dramatically with pt developing hypercarbic failure, requiring ICU transfer -Since transfer to ICU, pt was continued on PPV with improvement in hypercarbic failure -Pt now alert and conversant, transfer of care back to [facility] on 10/22 - Pt was recently educated on the importance of home CPAP -Continue diuresis as tolerated with IV BID lasix -Continue to wean O2 as tolerated. Currently remains at [facility] -Pt has been noncompliant with fluid intake. Now on 1500cc fluid restricted diet, continuing to diurese well  Asthma: -Continue Dulera and as needed albuterol. -Without wheezing on exam this AM with pt continued on bipap while sleeping  Leukocytosis: -No obvious evidence of infection noted -WBC had been stable, currently 15k  Hypertension: -Pt on home Coreg, losartan, hydralazine. -BP remains stable currently  Hyperlipidemia: -Would continue home atorvastatin as pt tolerates  Tobacco use: -Pt reportedly  usually smoking 1 pack/day, reportedly has not smoked the last 2 weeks. -recommend cessation  Morbid obesity -Recommend diet/lifestyle modification  DVT prophylaxis: Lovenox subq Code Status: Full Family Communication: Pt in room, family not at bedside  Status is: Inpatient  The patient will require inpatient stay because: Hemodynamically unstable, Altered mental status, Unsafe d/c plan and Inpatient level of care appropriate due to severity of illness  Dispo: The patient is from: Home              Anticipated d/c is to: Home              Anticipated d/c date is: > 3 days              Patient currently is not medically stable to d/c.  Consultants:   Critical Care  Procedures:     Antimicrobials: Anti-infectives (From admission, onward)   None      Subjective: No complaints this AM. Sitting at side of bed reporting feeling somewhat better. Understands he still needs continued stay  Objective: Vitals:   10/22/20 0906 10/22/20 0937 10/22/20 1419 10/22/20 1440  BP: 131/81  120/82   Pulse: 94  100   Resp:   (!) 22   Temp: 98.1 F (36.7 C)  98.3 F (36.8 C)   TempSrc: Oral  Oral   SpO2: 96% 97% (!) 82% 96%  Weight:      Height:        Intake/Output Summary (Last 24 hours) at 10/22/2020 1601 Last data filed at 10/22/2020 0901 Gross per 24 hour  Intake 1350 ml  Output 2225 ml  Net -875 ml   Filed Weights   10/20/20 0506 10/21/20 0538 10/22/20 0613  Weight: Marland Kitchen)  165.2 kg (!) 164.1 kg (!) 161.9 kg    Examination: General exam: Conversant, in no acute distress Respiratory system: normal chest rise, clear, no audible wheezing Cardiovascular system: regular rhythm, s1-s2 Gastrointestinal system: Nondistended, nontender, pos BS, morbidly obese Central nervous system: No seizures, no tremors Extremities: No cyanosis, no joint deformities Skin: No rashes, no pallor Psychiatry: Affect normal // no auditory hallucinations   Data Reviewed: I have personally  reviewed following labs and imaging studies  CBC: Recent Labs  Lab 10/16/20 1358 10/16/20 1358 10/17/20 0424 10/17/20 0424 10/17/20 0816 10/18/20 0250 10/19/20 0242 10/20/20 0816 10/22/20 0529  WBC 21.0*   < > 23.1*  --   --  20.6* 19.2* 14.3* 15.1*  NEUTROABS 15.6*  --   --   --   --   --   --   --   --   HGB 15.6   < > 16.2   < > 18.0* 16.1 15.5 15.5 15.9  HCT 52.2*   < > 55.2*   < > 53.0* 56.0* 51.7 51.4 53.6*  MCV 100.6*   < > 104.2*  --   --  103.5* 100.4* 99.4 99.8  PLT 304   < > 300  --   --  253 268 249 247   < > = values in this interval not displayed.   Basic Metabolic Panel: Recent Labs  Lab 10/17/20 0424 10/17/20 0816 10/18/20 0250 10/18/20 2032 10/19/20 0242 10/20/20 0816 10/22/20 0529  NA 140   < > 142 142 140 137 138  K 5.0   < > 5.5* 4.6 4.7 4.2 5.4*  CL 94*   < > 92* 90* 89* 86* 84*  CO2 34*   < > 40* 40* 38* 43* 44*  GLUCOSE 143*   < > 105* 94 108* 137* 125*  BUN 24*   < > 26* 29* 32* 29* 24*  CREATININE 1.01   < > 1.14 1.11 1.21 1.13 0.98  CALCIUM 8.7*   < > 8.9 8.9 8.7* 8.6* 9.2  MG 2.6*  --   --   --   --   --  2.4   < > = values in this interval not displayed.   GFR: Estimated Creatinine Clearance: 152.2 mL/min (by C-G formula based on SCr of 0.98 mg/dL). Liver Function Tests: Recent Labs  Lab 10/16/20 1358 10/20/20 0816 10/22/20 0529  AST 25 22 62*  ALT 39 23 56*  ALKPHOS 57 54 53  BILITOT 1.8* 3.3* 2.6*  PROT 7.4 6.8 7.1  ALBUMIN 3.6 3.5 3.6   No results for input(s): LIPASE, AMYLASE in the last 168 hours. No results for input(s): AMMONIA in the last 168 hours. Coagulation Profile: No results for input(s): INR, PROTIME in the last 168 hours. Cardiac Enzymes: No results for input(s): CKTOTAL, CKMB, CKMBINDEX, TROPONINI in the last 168 hours. BNP (last 3 results) No results for input(s): PROBNP in the last 8760 hours. HbA1C: No results for input(s): HGBA1C in the last 72 hours. CBG: Recent Labs  Lab 10/18/20 0801  10/18/20 1651 10/19/20 0812 10/19/20 1137 10/20/20 0749  GLUCAP 84 96 93 113* 148*   Lipid Profile: No results for input(s): CHOL, HDL, LDLCALC, TRIG, CHOLHDL, LDLDIRECT in the last 72 hours. Thyroid Function Tests: No results for input(s): TSH, T4TOTAL, FREET4, T3FREE, THYROIDAB in the last 72 hours. Anemia Panel: No results for input(s): VITAMINB12, FOLATE, FERRITIN, TIBC, IRON, RETICCTPCT in the last 72 hours. Sepsis Labs: No results for input(s): PROCALCITON, LATICACIDVEN in  the last 168 hours.  Recent Results (from the past 240 hour(s))  Respiratory Panel by RT PCR (Flu A&B, Covid) -     Status: None   Collection Time: 10/16/20  2:35 PM  Result Value Ref Range Status   SARS Coronavirus 2 by RT PCR NEGATIVE NEGATIVE Final    Comment: (NOTE) SARS-CoV-2 target nucleic acids are NOT DETECTED.  The SARS-CoV-2 RNA is generally detectable in upper respiratoy specimens during the acute phase of infection. The lowest concentration of SARS-CoV-2 viral copies this assay can detect is 131 copies/mL. A negative result does not preclude SARS-Cov-2 infection and should not be used as the sole basis for treatment or other patient management decisions. A negative result may occur with  improper specimen collection/handling, submission of specimen other than nasopharyngeal swab, presence of viral mutation(s) within the areas targeted by this assay, and inadequate number of viral copies (<131 copies/mL). A negative result must be combined with clinical observations, patient history, and epidemiological information. The expected result is Negative.  Fact Sheet for Patients:  https://www.moore.com/https://www.fda.gov/media/142436/download  Fact Sheet for Healthcare Providers:  https://www.young.biz/https://www.fda.gov/media/142435/download  This test is no t yet approved or cleared by the [place] FDA and  has been authorized for detection and/or diagnosis of SARS-CoV-2 by FDA under an Emergency Use Authorization (EUA). This  EUA will remain  in effect (meaning this test can be used) for the duration of the COVID-19 declaration under Section 564(b)(1) of the Act, 21 U.S.C. section 360bbb-3(b)(1), unless the authorization is terminated or revoked sooner.     Influenza A by PCR NEGATIVE NEGATIVE Final   Influenza B by PCR NEGATIVE NEGATIVE Final    Comment: (NOTE) The Xpert Xpress SARS-CoV-2/FLU/RSV assay is intended as an aid in  the diagnosis of influenza from Nasopharyngeal swab specimens and  should not be used as a sole basis for treatment. Nasal washings and  aspirates are unacceptable for Xpert Xpress SARS-CoV-2/FLU/RSV  testing.  Fact Sheet for Patients: https://www.moore.com/https://www.fda.gov/media/142436/download  Fact Sheet for Healthcare Providers: https://www.young.biz/https://www.fda.gov/media/142435/download  This test is not yet approved or cleared by the [place] FDA and  has been authorized for detection and/or diagnosis of SARS-CoV-2 by  FDA under an Emergency Use Authorization (EUA). This EUA will remain  in effect (meaning this test can be used) for the duration of the  Covid-19 declaration under Section 564(b)(1) of the Act, 21  U.S.C. section 360bbb-3(b)(1), unless the authorization is  terminated or revoked. Performed at [facility], [address] [place], [place] [address]   MRSA PCR Screening     Status: None   Collection Time: 10/18/20  9:00 AM   Specimen: Nasopharyngeal  Result Value Ref Range Status   MRSA by PCR NEGATIVE NEGATIVE Final    Comment:        The GeneXpert MRSA Assay (FDA approved for NASAL specimens only), is one component of a comprehensive MRSA colonization surveillance program. It is not intended to diagnose MRSA infection nor to guide or monitor treatment for MRSA infections. Performed at [facility], [address] [place], [place] [address]      Radiology Studies: No results found.  Scheduled Meds: . atorvastatin  40 mg Oral Daily   . carvedilol  6.25 mg Oral BID  . chlorhexidine  15 mL Mouth Rinse BID  . enoxaparin (LOVENOX) injection  80 mg Subcutaneous Q24H  . furosemide  40 mg Intravenous Q12H  . hydrALAZINE  50 mg Oral BID  . mouth rinse  15 mL Mouth Rinse BID  . mometasone-formoterol  2 puff Inhalation BID  . nicotine  14 mg Transdermal Q24H  . pantoprazole  40 mg Oral Daily  . sodium chloride flush  3 mL Intravenous Q12H   Continuous Infusions:   LOS: 5 days   Rickey Barbara, MD [facility] Pager On Amion  If 7PM-7AM, please contact night-coverage 10/22/2020, 4:01 PM

## 2020-10-23 LAB — COMPREHENSIVE METABOLIC PANEL
ALT: 77 U/L — ABNORMAL HIGH (ref 0–44)
AST: 64 U/L — ABNORMAL HIGH (ref 15–41)
Albumin: 3.8 g/dL (ref 3.5–5.0)
Alkaline Phosphatase: 54 U/L (ref 38–126)
Anion gap: 9 (ref 5–15)
BUN: 26 mg/dL — ABNORMAL HIGH (ref 6–20)
CO2: 43 mmol/L — ABNORMAL HIGH (ref 22–32)
Calcium: 9.2 mg/dL (ref 8.9–10.3)
Chloride: 85 mmol/L — ABNORMAL LOW (ref 98–111)
Creatinine, Ser: 1.05 mg/dL (ref 0.61–1.24)
GFR, Estimated: >60 mL/min (ref >60)
Glucose, Bld: 116 mg/dL — ABNORMAL HIGH (ref 70–99)
Potassium: 4.6 mmol/L (ref 3.5–5.1)
Sodium: 137 mmol/L (ref 135–145)
Total Bilirubin: 2.1 mg/dL — ABNORMAL HIGH (ref 0.3–1.2)
Total Protein: 7.7 g/dL (ref 6.5–8.1)

## 2020-10-23 LAB — CBC
HCT: 52.4 % — ABNORMAL HIGH (ref 39.0–52.0)
Hemoglobin: 15.9 g/dL (ref 13.0–17.0)
MCH: 30 pg (ref 26.0–34.0)
MCHC: 30.3 g/dL (ref 30.0–36.0)
MCV: 98.9 fL (ref 80.0–100.0)
Platelets: 256 10*3/uL (ref 150–400)
RBC: 5.3 MIL/uL (ref 4.22–5.81)
RDW: 13 % (ref 11.5–15.5)
WBC: 15.5 10*3/uL — ABNORMAL HIGH (ref 4.0–10.5)
nRBC: 0 % (ref 0.0–0.2)

## 2020-10-23 LAB — MAGNESIUM: Magnesium: 2.6 mg/dL — ABNORMAL HIGH (ref 1.7–2.4)

## 2020-10-23 NOTE — Progress Notes (Signed)
Pt. placed on BiPAP for h/s, XL Full Face mask placed on for better seal, RT to monitor.

## 2020-10-23 NOTE — Progress Notes (Signed)
Nutrition Education Note  RD consulted for nutrition education regarding CHF exacerbation and need for fluid restriction.  RD provided "Low Sodium Nutrition Therapy," "Sodium Content of Foods," "Sodium-Free Flavoring Tips," and "Fluid-Restricted Nutrition Therapy" handouts from the Academy of Nutrition and Dietetics. Reviewed patient's dietary recall. Provided examples on ways to decrease sodium intake in diet. Discouraged intake of processed foods and use of salt shaker. Encouraged fresh fruits and vegetables as well as whole grain sources of carbohydrates to maximize fiber intake.   RD discussed why it is important for patient to adhere to diet recommendations, and emphasized the role of fluids, foods to avoid, and importance of weighing self daily. Teach back method used.  He states that he would like for his mom to be present during education. His mom is at work currently and works later than the time that RD is available. Encouraged patient to share handouts given to him during RD visit with his mom and they go over all information reviewed during education, although maybe worded in a slightly different way. Patient is agreeable and understanding of this.   Patient lives with his mom who does the majority of the cooking and meal prep. If his mom is not home, he typically gets food from a fast food restaurant. Discussed lower sodium options at restaurants.   Most days he will eat cereal with milk for breakfast, drink several 20 oz bottles of juice or soda between that time and lunch, will have a large sweet tea from fast food restaurant as part of lunch, and will drink several other beverages the rest of the evening, especially if he does not eat dinner.   Patient plans to start walking 1/4 to 1/2 mile per day and to get a fitbit. He is dedicated to making lifestyle changes because he would like to avoid having to wear O2 constantly throughout the day.   Expect good to fair compliance.  Body  mass index is 44.28 kg/m. Pt meets criteria for morbid obesity based on current BMI.  Current diet order is Heart Healthy with 1.5L fluid restriction. He has been eating 100% of all meals. Labs and medications reviewed.   No further nutrition interventions warranted at this time. RD contact information provided. If additional nutrition issues arise, please re-consult RD.       Trenton Gammon, MS, RD, LDN, CNSC Inpatient Clinical Dietitian RD pager # available in AMION  After hours/weekend pager # available in [facility]

## 2020-10-23 NOTE — Progress Notes (Signed)
RT notified by RN pt. removed BiPAP V-60, placed back on n/c.

## 2020-10-23 NOTE — Progress Notes (Signed)
PROGRESS NOTE    Joseph Morales  UDJ:[id number] DOB: Mar 12, 1971 DOA: 10/16/2020 PCP: Patient, No Pcp Per    Brief Narrative:  49 y.o. male with medical history significant for chronic diastolic CHF (EF 58-85% By TTE 06/22/2020), asthma, HTN, HLD, tobacco use, and suspected OHS who presents to the ED for evaluation of progressive dyspnea and lower extremity swelling.  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 -Since transfer to ICU, pt was continued on PPV with improvement in hypercarbic failure. Did not require intubation -Transfer of care back to [facility] on 10/22 - Pt was recently educated on the importance of home CPAP -Continue diuresis as tolerated with IV BID lasix -Continue to wean O2 as tolerated. Currently remains at [facility] -Pt recently has been noncompliant with fluid intake and is now on a 1500cc fluid restricted diet, continuing to diurese well -repeat bmet in AM  Asthma: -Continue Dulera and as needed albuterol. -Without wheezing on exam this AM with pt continued on bipap while sleeping  Leukocytosis: -No obvious evidence of infection noted -WBC had been stable, remains around 15k -Repeat CBC in AM  Hypertension: -Pt on home Coreg, losartan, hydralazine. -BP remains stable at this  time  Hyperlipidemia: -Would continue home atorvastatin as tolerated  Tobacco use: -Pt reportedly usually smoking 1 pack/day, reportedly has not smoked the last 2 weeks. -recommend cessation  Morbid obesity -Recommend diet/lifestyle modification  DVT prophylaxis: Lovenox subq Code Status: Full Family Communication: Pt in room, family not at bedside  Status is: Inpatient  The patient will require inpatient stay because: Unsafe d/c plan, IV treatments appropriate due to intensity of illness or inability to take PO and Inpatient level of care appropriate due to severity of illness  Dispo: The patient is from: Home              Anticipated d/c is to: Home              Anticipated d/c date is: > 3 days              Patient currently is not medically stable to d/c.  Consultants:   Critical Care  Procedures:     Antimicrobials: Anti-infectives (From admission, onward)   None      Subjective: No complaints this AM. Does feel better. Continuing to void well  Objective: Vitals:   10/23/20 0917 10/23/20 1007 10/23/20 1009 10/23/20 1215  BP:  112/64 112/64 123/76  Pulse:  89  90  Resp:    20  Temp:    97.9 F (36.6 C)  TempSrc:      SpO2: 99%   95%  Weight:      Height:        Intake/Output Summary (Last 24 hours) at 10/23/2020 1703 Last data filed at 10/23/2020 1445 Gross per 24 hour  Intake 681 ml  Output 3600 ml  Net -2919 ml   [employer]  10/21/20 0538 10/22/20 0613 10/23/20 0500  Weight: (!) 164.1 kg (!) 161.9 kg (!) 160.7 kg    Examination: General exam: Awake, laying in bed, in nad Respiratory system: Normal respiratory effort, no wheezing Cardiovascular system: regular rate, s1, s2 Gastrointestinal system: Soft, nondistended, positive BS Central nervous system: CN2-12 grossly intact, strength intact Extremities: Perfused, no clubbing Skin: Normal skin turgor, no notable skin lesions seen Psychiatry: Mood normal // no visual hallucinations    Data Reviewed: I have personally reviewed following labs and imaging studies  CBC: Recent Labs  Lab 10/18/20 0250 10/19/20 0242 10/20/20 0816 10/22/20 0529 10/23/20 0505  WBC 20.6* 19.2* 14.3* 15.1* 15.5*  HGB 16.1 15.5 15.5 15.9 15.9  HCT 56.0* 51.7 51.4 53.6* 52.4*  MCV 103.5* 100.4* 99.4 99.8 98.9  PLT 253 268 249 247 256   Basic Metabolic Panel: Recent Labs  Lab 10/17/20 0424 10/17/20 0816 10/18/20 2032 10/19/20 0242 10/20/20 0816 10/22/20 0529 10/23/20 0505  NA 140   < > 142 140 137 138 137  K 5.0   < > 4.6 4.7 4.2 5.4* 4.6  CL 94*   < > 90* 89* 86* 84* 85*  CO2 34*   < > 40* 38* 43* 44* 43*  GLUCOSE 143*   < > 94 108* 137* 125* 116*  BUN 24*   < > 29* 32* 29* 24* 26*  CREATININE 1.01   < > 1.11 1.21 1.13 0.98 1.05  CALCIUM 8.7*   < > 8.9 8.7* 8.6* 9.2 9.2  MG 2.6*  --   --   --   --  2.4 2.6*   < > = values in this interval not displayed.   GFR: Estimated Creatinine Clearance: 141.5 mL/min (by C-G formula based on SCr of 1.05 mg/dL). Liver Function Tests: Recent Labs  Lab 10/20/20 0816 10/22/20 0529 10/23/20 0505  AST 22 62* 64*  ALT 23 56* 77*  ALKPHOS 54 53 54  BILITOT 3.3* 2.6* 2.1*  PROT 6.8 7.1 7.7  ALBUMIN 3.5 3.6 3.8   No results for input(s): LIPASE, AMYLASE in the last 168 hours. No results for input(s): AMMONIA in the last 168 hours. Coagulation Profile: No results for input(s): INR, PROTIME in the last 168 hours. Cardiac Enzymes: No results for input(s): CKTOTAL, CKMB, CKMBINDEX, TROPONINI in the last 168 hours. BNP (last 3 results) No results for input(s): PROBNP in the last 8760 hours. HbA1C: No results for input(s): HGBA1C in the last 72 hours. CBG: Recent Labs  Lab 10/18/20 0801 10/18/20 1651 10/19/20 0812 10/19/20 1137 10/20/20 0749  GLUCAP 84 96 93 113* 148*   Lipid Profile: No results for input(s): CHOL, HDL, LDLCALC, TRIG, CHOLHDL, LDLDIRECT in the last 72 hours. Thyroid Function Tests: No results for input(s):  TSH, T4TOTAL, FREET4, T3FREE, THYROIDAB in the last 72 hours. Anemia Panel: No results for input(s): VITAMINB12, FOLATE, FERRITIN, TIBC, IRON, RETICCTPCT in the last 72 hours. Sepsis Labs: No results for input(s): PROCALCITON, LATICACIDVEN in the last 168 hours.  Recent Results (from the past 240 hour(s))  Respiratory Panel by RT PCR (Flu A&B, Covid) -     Status: None   Collection Time: 10/16/20  2:35 PM  Result Value Ref Range Status   SARS Coronavirus 2 by RT PCR NEGATIVE NEGATIVE Final    Comment: (NOTE) SARS-CoV-2 target nucleic acids are NOT DETECTED.  The SARS-CoV-2 RNA is generally detectable in upper respiratoy specimens during the acute phase of infection. The lowest concentration of SARS-CoV-2 viral copies this assay  can detect is 131 copies/mL. A negative result does not preclude SARS-Cov-2 infection and should not be used as the sole basis for treatment or other patient management decisions. A negative result may occur with  improper specimen collection/handling, submission of specimen other than nasopharyngeal swab, presence of viral mutation(s) within the areas targeted by this assay, and inadequate number of viral copies (<131 copies/mL). A negative result must be combined with clinical observations, patient history, and epidemiological information. The expected result is Negative.  Fact Sheet for Patients:  https://www.moore.com/  Fact Sheet for Healthcare Providers:  https://www.young.biz/  This test is no t yet approved or cleared by the [place] FDA and  has been authorized for detection and/or diagnosis of SARS-CoV-2 by FDA under an Emergency Use Authorization (EUA). This EUA will remain  in effect (meaning this test can be used) for the duration of the COVID-19 declaration under Section 564(b)(1) of the Act, 21 U.S.C. section 360bbb-3(b)(1), unless the authorization is terminated or revoked sooner.      Influenza A by PCR NEGATIVE NEGATIVE Final   Influenza B by PCR NEGATIVE NEGATIVE Final    Comment: (NOTE) The Xpert Xpress SARS-CoV-2/FLU/RSV assay is intended as an aid in  the diagnosis of influenza from Nasopharyngeal swab specimens and  should not be used as a sole basis for treatment. Nasal washings and  aspirates are unacceptable for Xpert Xpress SARS-CoV-2/FLU/RSV  testing.  Fact Sheet for Patients: https://www.moore.com/  Fact Sheet for Healthcare Providers: https://www.young.biz/  This test is not yet approved or cleared by the [place] FDA and  has been authorized for detection and/or diagnosis of SARS-CoV-2 by  FDA under an Emergency Use Authorization (EUA). This EUA will remain  in effect (meaning this test can be used) for the duration of the  Covid-19 declaration under Section 564(b)(1) of the Act, 21  U.S.C. section 360bbb-3(b)(1), unless the authorization is  terminated or revoked. Performed at [facility], [address] [place], [place] [address]   MRSA PCR Screening     Status: None   Collection Time: 10/18/20  9:00 AM   Specimen: Nasopharyngeal  Result Value Ref Range Status   MRSA by PCR NEGATIVE NEGATIVE Final    Comment:        The GeneXpert MRSA Assay (FDA approved for NASAL specimens only), is one component of a comprehensive MRSA colonization surveillance program. It is not intended to diagnose MRSA infection nor to guide or monitor treatment for MRSA infections. Performed at [facility], [address] [place], [place] [address]      Radiology Studies: No results found.  Scheduled Meds: . atorvastatin  40 mg Oral Daily  . carvedilol  6.25 mg Oral BID  . chlorhexidine  15 mL Mouth Rinse BID  . enoxaparin (LOVENOX) injection  80 mg Subcutaneous Q24H  . furosemide  40 mg Intravenous Q12H  . hydrALAZINE  50 mg Oral BID  . mouth rinse  15 mL Mouth Rinse BID   . mometasone-formoterol  2 puff Inhalation BID  . nicotine  14 mg Transdermal Q24H  . pantoprazole  40 mg Oral Daily  . sodium chloride flush  3 mL Intravenous Q12H   Continuous Infusions:   LOS: 6 days   Rickey Barbara, MD [facility] Pager On Amion  If 7PM-7AM, please contact night-coverage 10/23/2020, 5:03 PM

## 2020-10-24 DIAGNOSIS — Z720 Tobacco use: Secondary | ICD-10-CM

## 2020-10-24 DIAGNOSIS — I5033 Acute on chronic diastolic (congestive) heart failure: Secondary | ICD-10-CM

## 2020-10-24 LAB — COMPREHENSIVE METABOLIC PANEL
ALT: 85 U/L — ABNORMAL HIGH (ref 0–44)
AST: 56 U/L — ABNORMAL HIGH (ref 15–41)
Albumin: 3.8 g/dL (ref 3.5–5.0)
Alkaline Phosphatase: 55 U/L (ref 38–126)
Anion gap: 12 (ref 5–15)
BUN: 27 mg/dL — ABNORMAL HIGH (ref 6–20)
CO2: 36 mmol/L — ABNORMAL HIGH (ref 22–32)
Calcium: 9 mg/dL (ref 8.9–10.3)
Chloride: 88 mmol/L — ABNORMAL LOW (ref 98–111)
Creatinine, Ser: 1 mg/dL (ref 0.61–1.24)
GFR, Estimated: >60 mL/min (ref >60)
Glucose, Bld: 135 mg/dL — ABNORMAL HIGH (ref 70–99)
Potassium: 4.7 mmol/L (ref 3.5–5.1)
Sodium: 136 mmol/L (ref 135–145)
Total Bilirubin: 1.7 mg/dL — ABNORMAL HIGH (ref 0.3–1.2)
Total Protein: 7.2 g/dL (ref 6.5–8.1)

## 2020-10-24 LAB — MAGNESIUM: Magnesium: 2.6 mg/dL — ABNORMAL HIGH (ref 1.7–2.4)

## 2020-10-24 NOTE — Assessment & Plan Note (Addendum)
-  Recommend diet/lifestyle modification - outpatient sleep study recommended

## 2020-10-24 NOTE — Assessment & Plan Note (Signed)
-  Continue Dulera and as needed albuterol.

## 2020-10-24 NOTE — Assessment & Plan Note (Signed)
-   continue nightly BiPAP while in hospital

## 2020-10-24 NOTE — Assessment & Plan Note (Addendum)
-   Recent admit for acute diastolic chf exacerbation ultimately improved with IV lasix diuresis -Most recent EF of 70-75% noted in 6/21 -Pt is on baseline 2LNC prior to admit -Shortly after admission, patient's O2 requirements worsened dramatically with pt developing hypercarbic failure, requiring ICU transfer. -Since transfer to ICU, pt was continued on PPV with improvement in hypercarbic failure. Did not require intubation -Transfer of care back to [facility] on 10/22 - Pt was recently educated on the importance of home CPAP -Continue diuresis as tolerated with IV BID lasix -Continue to wean O2 as tolerated. Currently remains at [facility] -Pt recently has been noncompliant with fluid intake and is now on a 1500cc fluid restricted diet, continuing to diurese well - lasix changed to PO at discharge

## 2020-10-24 NOTE — Assessment & Plan Note (Signed)
-  Pt on home Coreg, losartan, hydralazine. -BP remains stable at this time

## 2020-10-24 NOTE — Assessment & Plan Note (Signed)
-   Pt reportedly usually smoking 1 pack/day, reportedly has not smoked the last 2 weeks. -recommend cessation

## 2020-10-24 NOTE — Hospital Course (Addendum)
49 y.o. male with medical history significant for chronic diastolic CHF (EF 63-78% By TTE 06/22/2020), asthma, HTN, HLD, tobacco use, and suspected OHS who presented to the ED for evaluation of progressive dyspnea and lower extremity swelling. He was found to have respiratory considered due to [facility] exacerbation and underlying OSA/OHS. He was started on diuresis and BiPAP with good response.  He understands he will need to follow up outpatient at discharge to set up a sleep study for home BiPAP/Trilogy approval.  He was continued on lasix at discharge.

## 2020-10-24 NOTE — Progress Notes (Signed)
RT Note: Pt. placed on BiPAP V-60 for h/s, settings adjusted to achieve better compliance for pt.,(see flowsheet) until/if Trilogy(Home BiPAP) arrives to floor for trial or pts. home use, RN made aware.

## 2020-10-24 NOTE — Assessment & Plan Note (Addendum)
-   continue baseline O2, 3L for now - continue nightly BiPAP - needs outpatient sleep study; patient has scheduled this prior to d/c

## 2020-10-24 NOTE — Progress Notes (Signed)
PROGRESS NOTE    Joseph Morales   KZS:[id number]  DOB: [birth year]  DOA: 10/16/2020     7  PCP: Patient, No Pcp Per  CC: SOB  Hospital Course: 49 y.o. male with medical history significant for chronic diastolic CHF (EF 73-22% By TTE 06/22/2020), asthma, HTN, HLD, tobacco use, and suspected OHS who presented to the ED for evaluation of progressive dyspnea and lower extremity swelling. He was found to have respiratory considered due to [facility] exacerbation and underlying OSA/OHS. He was started on diuresis and BiPAP with good response.  He understands he will need to follow up outpatient at discharge to set up a sleep study for home BiPAP/Trilogy approval.    Interval History:  No events overnight. Still tolerating BiPAP well. Breathing is comfortable today. He says he is going to call his PCP to set up an appt at discharge so he can coordinate a sleep study.   Old records reviewed in assessment of this patient  ROS: Constitutional: negative for chills and fevers, Respiratory: negative for cough, Cardiovascular: negative for chest pain and Gastrointestinal: negative for abdominal pain  Assessment & Plan: * Acute on chronic respiratory failure with hypoxia (HCC) - continue baseline O2, 4L for now - continue nightly BiPAP - needs outpatient sleep study   Acute on chronic diastolic CHF (congestive heart failure) (HCC) - Recent admit for acute diastolic chf exacerbation ultimately improved with IV lasix diuresis -Most recent EF of 70-75% noted in 6/21 -Pt is on baseline 2LNC prior to admit -Shortly after admission, patient's O2 requirements worsened dramatically with pt developing hypercarbic failure, requiring ICU transfer. -Since transfer to ICU, pt was continued on PPV with improvement in hypercarbic failure. Did not require intubation -Transfer of care back to [facility] on 10/22 - Pt was recently educated on the importance of home CPAP -Continue diuresis as tolerated with IV BID  lasix -Continue to wean O2 as tolerated. Currently remains at [facility] -Pt recently has been noncompliant with fluid intake and is now on a 1500cc fluid restricted diet, continuing to diurese well  Tobacco abuse - Pt reportedly usually smoking 1 pack/day, reportedly has not smoked the last 2 weeks. -recommend cessation  Hyperlipidemia - continue statin   Essential hypertension -Pt on home Coreg, losartan, hydralazine. -BP remains stable at this time  Obesity hypoventilation syndrome (HCC) - continue nightly BiPAP while in hospital   Obesity, Class III, BMI 40-49.9 (morbid obesity) (HCC) -Recommend diet/lifestyle modification - outpatient sleep study recommended   Asthma -Continue Dulera and as needed albuterol.    Antimicrobials: none  DVT prophylaxis: Lovenox Code Status: Full Family Communication: none present Disposition Plan: Status is: Inpatient  Remains inpatient appropriate because:Unsafe d/c plan, IV treatments appropriate due to intensity of illness or inability to take PO and Inpatient level of care appropriate due to severity of illness   Dispo: The patient is from: Home              Anticipated d/c is to: Home              Anticipated d/c date is: 2 days              Patient currently is not medically stable to d/c.       Objective: Blood pressure 121/73, pulse 91, temperature 98.4 F (36.9 C), resp. rate 20, height 6\' 3"  (1.905 m), weight (!) 160.3 kg, SpO2 95 %.  Examination: General appearance: alert, cooperative, no distress and morbidly obese Head: Normocephalic, without obvious abnormality, atraumatic  Eyes: EOMI Lungs: clear to auscultation bilaterally Heart: regular rate and rhythm and S1, S2 normal Abdomen: normal findings: bowel sounds normal and soft, non-tender Extremities: no edema Skin: mobility and turgor normal Neurologic: Grossly normal  Consultants:   PCCM  Procedures:   none  Data Reviewed: I have personally reviewed  following labs and imaging studies Results for orders placed or performed during the hospital encounter of 10/16/20 (from the past 24 hour(s))  Comprehensive metabolic panel     Status: Abnormal   Collection Time: 10/24/20  4:42 AM  Result Value Ref Range   Sodium 136 135 - 145 mmol/L   Potassium 4.7 3.5 - 5.1 mmol/L   Chloride 88 (L) 98 - 111 mmol/L   CO2 36 (H) 22 - 32 mmol/L   Glucose, Bld 135 (H) 70 - 99 mg/dL   BUN 27 (H) 6 - 20 mg/dL   Creatinine, Ser 7.84 0.61 - 1.24 mg/dL   Calcium 9.0 8.9 - 69.6 mg/dL   Total Protein 7.2 6.5 - 8.1 g/dL   Albumin 3.8 3.5 - 5.0 g/dL   AST 56 (H) 15 - 41 U/L   ALT 85 (H) 0 - 44 U/L   Alkaline Phosphatase 55 38 - 126 U/L   Total Bilirubin 1.7 (H) 0.3 - 1.2 mg/dL   GFR, Estimated >29 >52 mL/min   Anion gap 12 5 - 15  Magnesium     Status: Abnormal   Collection Time: 10/24/20  4:42 AM  Result Value Ref Range   Magnesium 2.6 (H) 1.7 - 2.4 mg/dL    Recent Results (from the past 240 hour(s))  Respiratory Panel by RT PCR (Flu A&B, Covid) -     Status: None   Collection Time: 10/16/20  2:35 PM  Result Value Ref Range Status   SARS Coronavirus 2 by RT PCR NEGATIVE NEGATIVE Final    Comment: (NOTE) SARS-CoV-2 target nucleic acids are NOT DETECTED.  The SARS-CoV-2 RNA is generally detectable in upper respiratoy specimens during the acute phase of infection. The lowest concentration of SARS-CoV-2 viral copies this assay can detect is 131 copies/mL. A negative result does not preclude SARS-Cov-2 infection and should not be used as the sole basis for treatment or other patient management decisions. A negative result may occur with  improper specimen collection/handling, submission of specimen other than nasopharyngeal swab, presence of viral mutation(s) within the areas targeted by this assay, and inadequate number of viral copies (<131 copies/mL). A negative result must be combined with clinical observations, patient history, and epidemiological  information. The expected result is Negative.  Fact Sheet for Patients:  https://www.moore.com/  Fact Sheet for Healthcare Providers:  https://www.young.biz/  This test is no t yet approved or cleared by the [place] FDA and  has been authorized for detection and/or diagnosis of SARS-CoV-2 by FDA under an Emergency Use Authorization (EUA). This EUA will remain  in effect (meaning this test can be used) for the duration of the COVID-19 declaration under Section 564(b)(1) of the Act, 21 U.S.C. section 360bbb-3(b)(1), unless the authorization is terminated or revoked sooner.     Influenza A by PCR NEGATIVE NEGATIVE Final   Influenza B by PCR NEGATIVE NEGATIVE Final    Comment: (NOTE) The Xpert Xpress SARS-CoV-2/FLU/RSV assay is intended as an aid in  the diagnosis of influenza from Nasopharyngeal swab specimens and  should not be used as a sole basis for treatment. Nasal washings and  aspirates are unacceptable for Xpert Xpress SARS-CoV-2/FLU/RSV  testing.  Fact Sheet for Patients: https://www.moore.com/  Fact Sheet for Healthcare Providers: https://www.young.biz/  This test is not yet approved or cleared by the [place] FDA and  has been authorized for detection and/or diagnosis of SARS-CoV-2 by  FDA under an Emergency Use Authorization (EUA). This EUA will remain  in effect (meaning this test can be used) for the duration of the  Covid-19 declaration under Section 564(b)(1) of the Act, 21  U.S.C. section 360bbb-3(b)(1), unless the authorization is  terminated or revoked. Performed at [facility], [address] [place], [place] [address]   MRSA PCR Screening     Status: None   Collection Time: 10/18/20  9:00 AM   Specimen: Nasopharyngeal  Result Value Ref Range Status   MRSA by PCR NEGATIVE NEGATIVE Final    Comment:        The GeneXpert MRSA Assay (FDA approved  for NASAL specimens only), is one component of a comprehensive MRSA colonization surveillance program. It is not intended to diagnose MRSA infection nor to guide or monitor treatment for MRSA infections. Performed at [facility], [address] [place], [place] [address]      Radiology Studies: No results found. DG CHEST PORT 1 VIEW  Final Result    DG Chest 1 View  Final Result    CT Angio Chest PE W/Cm &/Or Wo Cm  Final Result    DG Chest Port 1 View  Final Result      Scheduled Meds: . atorvastatin  40 mg Oral Daily  . carvedilol  6.25 mg Oral BID  . chlorhexidine  15 mL Mouth Rinse BID  . enoxaparin (LOVENOX) injection  80 mg Subcutaneous Q24H  . furosemide  40 mg Intravenous Q12H  . hydrALAZINE  50 mg Oral BID  . mouth rinse  15 mL Mouth Rinse BID  . mometasone-formoterol  2 puff Inhalation BID  . nicotine  14 mg Transdermal Q24H  . pantoprazole  40 mg Oral Daily  . sodium chloride flush  3 mL Intravenous Q12H   PRN Meds: acetaminophen **OR** acetaminophen, ipratropium-albuterol, ondansetron **OR** ondansetron (ZOFRAN) IV Continuous Infusions:   LOS: 7 days  Time spent: Greater than 50% of the 35 minute visit was spent in counseling/coordination of care for the patient as laid out in the A&P.   Lewie Chamber, MD [facility] 10/24/2020, 2:41 PM

## 2020-10-24 NOTE — Assessment & Plan Note (Signed)
continue statin

## 2020-10-25 ENCOUNTER — Other Ambulatory Visit (HOSPITAL_COMMUNITY): Payer: Self-pay | Admitting: Internal Medicine

## 2020-10-25 LAB — CBC WITH DIFFERENTIAL/PLATELET
Abs Immature Granulocytes: 0.06 10*3/uL (ref 0.00–0.07)
Basophils Absolute: 0.1 10*3/uL (ref 0.0–0.1)
Basophils Relative: 0 %
Eosinophils Absolute: 0.4 10*3/uL (ref 0.0–0.5)
Eosinophils Relative: 3 %
HCT: 50.3 % (ref 39.0–52.0)
Hemoglobin: 15.6 g/dL (ref 13.0–17.0)
Immature Granulocytes: 0 %
Lymphocytes Relative: 18 %
Lymphs Abs: 2.7 10*3/uL (ref 0.7–4.0)
MCH: 30.2 pg (ref 26.0–34.0)
MCHC: 31 g/dL (ref 30.0–36.0)
MCV: 97.3 fL (ref 80.0–100.0)
Monocytes Absolute: 1.1 10*3/uL — ABNORMAL HIGH (ref 0.1–1.0)
Monocytes Relative: 8 %
Neutro Abs: 10.4 10*3/uL — ABNORMAL HIGH (ref 1.7–7.7)
Neutrophils Relative %: 71 %
Platelets: 253 10*3/uL (ref 150–400)
RBC: 5.17 MIL/uL (ref 4.22–5.81)
RDW: 12.8 % (ref 11.5–15.5)
WBC: 14.7 10*3/uL — ABNORMAL HIGH (ref 4.0–10.5)
nRBC: 0 % (ref 0.0–0.2)

## 2020-10-25 LAB — BASIC METABOLIC PANEL
Anion gap: 12 (ref 5–15)
BUN: 29 mg/dL — ABNORMAL HIGH (ref 6–20)
CO2: 38 mmol/L — ABNORMAL HIGH (ref 22–32)
Calcium: 9.3 mg/dL (ref 8.9–10.3)
Chloride: 87 mmol/L — ABNORMAL LOW (ref 98–111)
Creatinine, Ser: 1.04 mg/dL (ref 0.61–1.24)
GFR, Estimated: >60 mL/min (ref >60)
Glucose, Bld: 142 mg/dL — ABNORMAL HIGH (ref 70–99)
Potassium: 4.3 mmol/L (ref 3.5–5.1)
Sodium: 137 mmol/L (ref 135–145)

## 2020-10-25 LAB — PHOSPHORUS: Phosphorus: 4.5 mg/dL (ref 2.5–4.6)

## 2020-10-25 LAB — MAGNESIUM: Magnesium: 2.4 mg/dL (ref 1.7–2.4)

## 2020-10-25 MED ORDER — FUROSEMIDE 40 MG PO TABS: 40.0000 mg | ORAL_TABLET | Freq: Two times a day (BID) | ORAL | 5 refills | Status: DC

## 2020-10-25 MED ORDER — CARVEDILOL 6.25 MG PO TABS
6.2500 mg | ORAL_TABLET | Freq: Two times a day (BID) | ORAL | 5 refills | Status: DC
Start: 2020-10-25 — End: 2021-02-22

## 2020-10-25 MED ORDER — LOSARTAN POTASSIUM 50 MG PO TABS: 50.0000 mg | ORAL_TABLET | Freq: Every day | ORAL | 5 refills | Status: DC

## 2020-10-25 MED FILL — FUROSEMIDE 40 MG TAB: 40 | 30 days supply | Qty: 60 | Fill #0

## 2020-10-25 MED FILL — CARVEDILOL 6.25 MG TABLET: 6.25 | 30 days supply | Qty: 60 | Fill #0

## 2020-10-25 MED FILL — LOSARTAN POTASSIUM 50 MG TA: 50 | 30 days supply | Qty: 30 | Fill #0

## 2020-10-25 NOTE — Progress Notes (Signed)
SATURATION QUALIFICATIONS: (This note is used to comply with regulatory documentation for home oxygen)  Patient Saturations on Room Air at Rest = 86%  Patient Saturations on Room Air while Ambulating = 86%  Patient Saturations on 3 Liters of oxygen while Ambulating = 92%

## 2020-10-25 NOTE — Progress Notes (Signed)
Patient's IV removed.  AVS reviewed with patient.  Verbalized understanding of discharge instructions, physician follow-up, medications, heart failure and oxygen.  Patient verbalized that he will be going to [employer] to get scales for home use.  Patient's home oxygen in place at discharge 3L.  Patient stable at time of discharge.

## 2020-10-25 NOTE — Discharge Summary (Signed)
Physician Discharge Summary   Joseph Morales ZOX:[id number] DOB: 1971-05-19 DOA: 10/16/2020  PCP: Patient, No Pcp Per  Admit date: 10/16/2020 Discharge date: 10/25/2020  Admitted From: home Disposition:  home Discharging physician: Lewie Chamber, MD  Recommendations for Outpatient Follow-up:  1. Sleep study needed for arranging home BiPAP/Trilogy  2. Check BMP; lasix continued at discharge    Patient discharged to home in Discharge Condition: stable CODE STATUS: Full Diet recommendation:  Diet Orders (From admission, onward)    Start     Ordered   10/25/20 0000  Diet - low sodium heart healthy        10/25/20 1015   10/21/20 0857  Diet Heart Room service appropriate? Yes; Fluid consistency: Thin; Fluid restriction: 1500 mL Fluid  Diet effective now       Question Answer Comment  Room service appropriate? Yes   Fluid consistency: Thin   Fluid restriction: 1500 mL Fluid      10/21/20 0857          Hospital Course: 49 y.o. male with medical history significant for chronic diastolic CHF (EF 81-19% By TTE 06/22/2020), asthma, HTN, HLD, tobacco use, and suspected OHS who presented to the ED for evaluation of progressive dyspnea and lower extremity swelling. He was found to have respiratory considered due to [facility] exacerbation and underlying OSA/OHS. He was started on diuresis and BiPAP with good response.  He understands he will need to follow up outpatient at discharge to set up a sleep study for home BiPAP/Trilogy approval.  He was continued on lasix at discharge.    * Acute on chronic respiratory failure with hypoxia (HCC) - continue baseline O2, 3L for now - continue nightly BiPAP - needs outpatient sleep study; patient has scheduled this prior to d/c   Acute on chronic diastolic CHF (congestive heart failure) (HCC) - Recent admit for acute diastolic chf exacerbation ultimately improved with IV lasix diuresis -Most recent EF of 70-75% noted in 6/21 -Pt is on baseline 2LNC  prior to admit -Shortly after admission, patient's O2 requirements worsened dramatically with pt developing hypercarbic failure, requiring ICU transfer. -Since transfer to ICU, pt was continued on PPV with improvement in hypercarbic failure. Did not require intubation -Transfer of care back to [facility] on 10/22 - Pt was recently educated on the importance of home CPAP -Continue diuresis as tolerated with IV BID lasix -Continue to wean O2 as tolerated. Currently remains at [facility] -Pt recently has been noncompliant with fluid intake and is now on a 1500cc fluid restricted diet, continuing to diurese well - lasix changed to PO at discharge  Tobacco abuse - Pt reportedly usually smoking 1 pack/day, reportedly has not smoked the last 2 weeks. -recommend cessation  Hyperlipidemia - continue statin   Essential hypertension -Pt on home Coreg, losartan, hydralazine. -BP remains stable at this time  Obesity hypoventilation syndrome (HCC) - continue nightly BiPAP while in hospital   Obesity, Class III, BMI 40-49.9 (morbid obesity) (HCC) -Recommend diet/lifestyle modification - outpatient sleep study recommended   Asthma -Continue Dulera and as needed albuterol.    The patient's chronic medical conditions were treated accordingly per the patient's home medication regimen except as noted.  On day of discharge, patient was felt deemed stable for discharge. Patient/family member advised to call PCP or come back to ER if needed.   Principal Diagnosis: Acute on chronic respiratory failure with hypoxia [facility])  Discharge Diagnoses: Active Hospital Problems   Diagnosis Date Noted  . Acute on chronic respiratory failure  with hypoxia (HCC) 10/16/2020    Priority: High  . Acute on chronic diastolic CHF (congestive heart failure) (HCC) 06/21/2020    Priority: High  . Tobacco abuse 10/24/2020  . Essential hypertension 10/16/2020  . Hyperlipidemia 10/16/2020  . Asthma 06/21/2020  . Obesity, Class III,  BMI 40-49.9 (morbid obesity) (HCC) 06/21/2020  . Obesity hypoventilation syndrome (HCC) 06/21/2020    Resolved Hospital Problems  No resolved problems to display.    Discharge Instructions    Diet - low sodium heart healthy   Complete by: As directed    Increase activity slowly   Complete by: As directed      Allergies as of 10/25/2020   No Known Allergies     Medication List    TAKE these medications   albuterol 108 (90 Base) MCG/ACT inhaler Commonly known as: VENTOLIN HFA Inhale 2 puffs into the lungs every 6 (six) hours as needed for wheezing or shortness of breath.   aspirin 81 MG EC tablet Take 1 tablet (81 mg total) by mouth daily. Swallow whole.   atorvastatin 40 MG tablet Commonly known as: LIPITOR Take 1 tablet (40 mg total) by mouth daily.   carvedilol 6.25 MG tablet Commonly known as: COREG Take 1 tablet (6.25 mg total) by mouth 2 (two) times daily. What changed:   medication strength  how much to take  when to take this   CINNAMON PO Take 1 capsule by mouth daily.   furosemide 40 MG tablet Commonly known as: Lasix Take 1 tablet (40 mg total) by mouth 2 (two) times daily.   hydrALAZINE 50 MG tablet Commonly known as: APRESOLINE Take 1 tablet (50 mg total) by mouth in the morning and at bedtime.   losartan 50 MG tablet Commonly known as: COZAAR Take 1 tablet (50 mg total) by mouth daily.   mometasone-formoterol 100-5 MCG/ACT Aero Commonly known as: DULERA Inhale 2 puffs into the lungs 2 (two) times daily.   nicotine 21 mg/24hr patch Commonly known as: NICODERM CQ - dosed in mg/24 hours Place 1 patch (21 mg total) onto the skin daily.            Durable Medical Equipment  (From admission, onward)         Start     Ordered   10/19/20 0823  For home use only DME Ventilator  Once       Comments: Home Trilogy for discharge.  Question:  Length of Need  Answer:  Lifetime   10/19/20 1610          Follow-up Information    Tomma Lightning, MD Follow up on 11/15/2020.   Specialty: Pulmonary Disease Why: Appt at 2:30 PM.  Please arrive at 2:15 for check in.  Contact information: [address] [place] [address] [phone]              No Known Allergies  Consultations: PCCM  Discharge Exam: BP 109/75 (BP Location: Right Arm)   Pulse 96   Temp 98.5 F (36.9 C) (Oral)   Resp 20   Ht  (1.905 m)   Wt (!) 160.3 kg   SpO2 90%   BMI 44.18 kg/m  General appearance: alert, cooperative, no distress and morbidly obese Head: Normocephalic, without obvious abnormality, atraumatic Eyes: EOMI Lungs: clear to auscultation bilaterally Heart: regular rate and rhythm and S1, S2 normal Abdomen: normal findings: bowel sounds normal and soft, non-tender Extremities: no edema Skin: mobility and turgor normal Neurologic:  Grossly normal  The results of significant diagnostics from this hospitalization (including imaging, microbiology, ancillary and laboratory) are listed below for reference.   Microbiology: Recent Results (from the past 240 hour(s))  Respiratory Panel by RT PCR (Flu A&B, Covid) -     Status: None   Collection Time: 10/16/20  2:35 PM  Result Value Ref Range Status   SARS Coronavirus 2 by RT PCR NEGATIVE NEGATIVE Final    Comment: (NOTE) SARS-CoV-2 target nucleic acids are NOT DETECTED.  The SARS-CoV-2 RNA is generally detectable in upper respiratoy specimens during the acute phase of infection. The lowest concentration of SARS-CoV-2 viral copies this assay can detect is 131 copies/mL. A negative result does not preclude SARS-Cov-2 infection and should not be used as the sole basis for treatment or other patient management decisions. A negative result may occur with  improper specimen collection/handling, submission of specimen other than nasopharyngeal swab, presence of viral mutation(s) within the areas targeted by this assay, and inadequate number of viral copies (<131  copies/mL). A negative result must be combined with clinical observations, patient history, and epidemiological information. The expected result is Negative.  Fact Sheet for Patients:  https://www.moore.com/  Fact Sheet for Healthcare Providers:  https://www.young.biz/  This test is no t yet approved or cleared by the [place] FDA and  has been authorized for detection and/or diagnosis of SARS-CoV-2 by FDA under an Emergency Use Authorization (EUA). This EUA will remain  in effect (meaning this test can be used) for the duration of the COVID-19 declaration under Section 564(b)(1) of the Act, 21 U.S.C. section 360bbb-3(b)(1), unless the authorization is terminated or revoked sooner.     Influenza A by PCR NEGATIVE NEGATIVE Final   Influenza B by PCR NEGATIVE NEGATIVE Final    Comment: (NOTE) The Xpert Xpress SARS-CoV-2/FLU/RSV assay is intended as an aid in  the diagnosis of influenza from Nasopharyngeal swab specimens and  should not be used as a sole basis for treatment. Nasal washings and  aspirates are unacceptable for Xpert Xpress SARS-CoV-2/FLU/RSV  testing.  Fact Sheet for Patients: https://www.moore.com/  Fact Sheet for Healthcare Providers: https://www.young.biz/  This test is not yet approved or cleared by the [place] FDA and  has been authorized for detection and/or diagnosis of SARS-CoV-2 by  FDA under an Emergency Use Authorization (EUA). This EUA will remain  in effect (meaning this test can be used) for the duration of the  Covid-19 declaration under Section 564(b)(1) of the Act, 21  U.S.C. section 360bbb-3(b)(1), unless the authorization is  terminated or revoked. Performed at [facility], [address], [place], [place] [address]   MRSA PCR Screening     Status: None   Collection Time: 10/18/20  9:00 AM   Specimen: Nasopharyngeal  Result Value Ref  Range Status   MRSA by PCR NEGATIVE NEGATIVE Final    Comment:        The GeneXpert MRSA Assay (FDA approved for NASAL specimens only), is one component of a comprehensive MRSA colonization surveillance program. It is not intended to diagnose MRSA infection nor to guide or monitor treatment for MRSA infections. Performed at [facility], [address], [place], [place] [address]      Labs: BNP (last 3 results) Recent Labs    06/21/20 1704 10/16/20 1358  BNP 33.9 225.1*   Basic Metabolic Panel: Recent Labs  Lab 10/20/20 0816 10/22/20 0529 10/23/20 0505 10/24/20 0442 10/25/20 0407  NA 137 138 137 136  137  K 4.2 5.4* 4.6 4.7 4.3  CL 86* 84* 85* 88* 87*  CO2 43* 44* 43* 36* 38*  GLUCOSE 137* 125* 116* 135* 142*  BUN 29* 24* 26* 27* 29*  CREATININE 1.13 0.98 1.05 1.00 1.04  CALCIUM 8.6* 9.2 9.2 9.0 9.3  MG  --  2.4 2.6* 2.6* 2.4  PHOS  --   --   --   --  4.5   Liver Function Tests: Recent Labs  Lab 10/20/20 0816 10/22/20 0529 10/23/20 0505 10/24/20 0442  AST 22 62* 64* 56*  ALT 23 56* 77* 85*  ALKPHOS 54 53 54 55  BILITOT 3.3* 2.6* 2.1* 1.7*  PROT 6.8 7.1 7.7 7.2  ALBUMIN 3.5 3.6 3.8 3.8   No results for input(s): LIPASE, AMYLASE in the last 168 hours. No results for input(s): AMMONIA in the last 168 hours. CBC: Recent Labs  Lab 10/19/20 0242 10/20/20 0816 10/22/20 0529 10/23/20 0505 10/25/20 0407  WBC 19.2* 14.3* 15.1* 15.5* 14.7*  NEUTROABS  --   --   --   --  10.4*  HGB 15.5 15.5 15.9 15.9 15.6  HCT 51.7 51.4 53.6* 52.4* 50.3  MCV 100.4* 99.4 99.8 98.9 97.3  PLT 268 249 247 256 253   Cardiac Enzymes: No results for input(s): CKTOTAL, CKMB, CKMBINDEX, TROPONINI in the last 168 hours. BNP: Invalid input(s): POCBNP CBG: Recent Labs  Lab 10/18/20 1651 10/19/20 0812 10/19/20 1137 10/20/20 0749  GLUCAP 96 93 113* 148*   D-Dimer No results for input(s): DDIMER in the last 72 hours. Hgb A1c No results for input(s):  HGBA1C in the last 72 hours. Lipid Profile No results for input(s): CHOL, HDL, LDLCALC, TRIG, CHOLHDL, LDLDIRECT in the last 72 hours. Thyroid function studies No results for input(s): TSH, T4TOTAL, T3FREE, THYROIDAB in the last 72 hours.  Invalid input(s): FREET3 Anemia work up No results for input(s): VITAMINB12, FOLATE, FERRITIN, TIBC, IRON, RETICCTPCT in the last 72 hours. Urinalysis    Component Value Date/Time   COLORURINE YELLOW 10/17/2020 1425   APPEARANCEUR CLEAR 10/17/2020 1425   LABSPEC 1.017 10/17/2020 1425   PHURINE 5.0 10/17/2020 1425   GLUCOSEU NEGATIVE 10/17/2020 1425   HGBUR NEGATIVE 10/17/2020 1425   BILIRUBINUR NEGATIVE 10/17/2020 1425   KETONESUR NEGATIVE 10/17/2020 1425   PROTEINUR NEGATIVE 10/17/2020 1425   NITRITE NEGATIVE 10/17/2020 1425   LEUKOCYTESUR NEGATIVE 10/17/2020 1425   Sepsis Labs Invalid input(s): PROCALCITONIN,  WBC,  LACTICIDVEN Microbiology Recent Results (from the past 240 hour(s))  Respiratory Panel by RT PCR (Flu A&B, Covid) -     Status: None   Collection Time: 10/16/20  2:35 PM  Result Value Ref Range Status   SARS Coronavirus 2 by RT PCR NEGATIVE NEGATIVE Final    Comment: (NOTE) SARS-CoV-2 target nucleic acids are NOT DETECTED.  The SARS-CoV-2 RNA is generally detectable in upper respiratoy specimens during the acute phase of infection. The lowest concentration of SARS-CoV-2 viral copies this assay can detect is 131 copies/mL. A negative result does not preclude SARS-Cov-2 infection and should not be used as the sole basis for treatment or other patient management decisions. A negative result may occur with  improper specimen collection/handling, submission of specimen other than nasopharyngeal swab, presence of viral mutation(s) within the areas targeted by this assay, and inadequate number of viral copies (<131 copies/mL). A negative result must be combined with clinical observations, patient history, and epidemiological  information. The expected result is Negative.  Fact Sheet for Patients:  https://www.moore.com/https://www.fda.gov/media/142436/download  Fact Sheet for Healthcare Providers:  https://www.young.biz/  This test is no t yet approved or cleared by the [place] FDA and  has been authorized for detection and/or diagnosis of SARS-CoV-2 by FDA under an Emergency Use Authorization (EUA). This EUA will remain  in effect (meaning this test can be used) for the duration of the COVID-19 declaration under Section 564(b)(1) of the Act, 21 U.S.C. section 360bbb-3(b)(1), unless the authorization is terminated or revoked sooner.     Influenza A by PCR NEGATIVE NEGATIVE Final   Influenza B by PCR NEGATIVE NEGATIVE Final    Comment: (NOTE) The Xpert Xpress SARS-CoV-2/FLU/RSV assay is intended as an aid in  the diagnosis of influenza from Nasopharyngeal swab specimens and  should not be used as a sole basis for treatment. Nasal washings and  aspirates are unacceptable for Xpert Xpress SARS-CoV-2/FLU/RSV  testing.  Fact Sheet for Patients: https://www.moore.com/  Fact Sheet for Healthcare Providers: https://www.young.biz/  This test is not yet approved or cleared by the [place] FDA and  has been authorized for detection and/or diagnosis of SARS-CoV-2 by  FDA under an Emergency Use Authorization (EUA). This EUA will remain  in effect (meaning this test can be used) for the duration of the  Covid-19 declaration under Section 564(b)(1) of the Act, 21  U.S.C. section 360bbb-3(b)(1), unless the authorization is  terminated or revoked. Performed at [facility], [address] [place], [place] [address]   MRSA PCR Screening     Status: None   Collection Time: 10/18/20  9:00 AM   Specimen: Nasopharyngeal  Result Value Ref Range Status   MRSA by PCR NEGATIVE NEGATIVE Final    Comment:        The GeneXpert MRSA Assay (FDA approved  for NASAL specimens only), is one component of a comprehensive MRSA colonization surveillance program. It is not intended to diagnose MRSA infection nor to guide or monitor treatment for MRSA infections. Performed at [facility], [address], [place], [place] [address]     Procedures/Studies: DG Chest 1 View  Result Date: 10/18/2020 CLINICAL DATA:  Obstructive sleep apnea EXAM: CHEST  1 VIEW COMPARISON:  10/16/2020 FINDINGS: Shallow inspiration. Developing infiltration or atelectasis in the left lung base. Cardiac enlargement. No pleural effusions. No pneumothorax. Mediastinal contours appear intact. IMPRESSION: Developing infiltration or atelectasis in the left lung base. Electronically Signed   By: Burman Nieves M.D.   On: 10/18/2020 05:44   CT Angio Chest PE W/Cm &/Or Wo Cm  Result Date: 10/16/2020 CLINICAL DATA:  Asthma, congestive heart failure, increasing dyspnea on exertion, orthopnea EXAM: CT ANGIOGRAPHY CHEST WITH CONTRAST TECHNIQUE: Multidetector CT imaging of the chest was performed using the standard protocol during bolus administration of intravenous contrast. Multiplanar CT image reconstructions and MIPs were obtained to evaluate the vascular anatomy. CONTRAST:  OMNIPAQUE IOHEXOL 350 MG/ML SOLN COMPARISON:  None. FINDINGS: Cardiovascular: Satisfactory opacification of the pulmonary arteries to the segmental level. No evidence of pulmonary embolism. Central pulmonary arteries are of normal caliber. No significant coronary artery calcification. Normal heart size. No pericardial effusion. Thoracic aorta is unremarkable. Mediastinum/Nodes: Thyroid unremarkable. No pathologic thoracic adenopathy. Small hiatal hernia. Lungs/Pleura: The lungs are clear. No pneumothorax or pleural effusion. There is mild bronchial wall thickening centrally suggesting mild airway inflammation. No central obstructing mass. Upper Abdomen: Mild nonspecific bilateral perinephric  stranding is noted, incompletely assessed on this examination. Otherwise, no acute abnormality. Musculoskeletal: No acute bone abnormality. Review of the MIP images  confirms the above findings. IMPRESSION: No acute pulmonary embolism. Airway inflammation with mild bronchial wall thickening. Small hiatal hernia. Nonspecific perinephric stranding within the visualized abdomen. Electronically Signed   By: Helyn Numbers MD   On: 10/16/2020 16:55   DG CHEST PORT 1 VIEW  Result Date: 10/19/2020 CLINICAL DATA:  Respiratory failure EXAM: PORTABLE CHEST 1 VIEW COMPARISON:  10/18/2020 FINDINGS: Shallow inspiration. Heart size is enlarged. Linear atelectasis in the left lung base. No pleural effusions. No pneumothorax. IMPRESSION: Shallow inspiration with linear atelectasis in the left lung base. Electronically Signed   By: Burman Nieves M.D.   On: 10/19/2020 06:14   DG Chest Port 1 View  Result Date: 10/16/2020 CLINICAL DATA:  Shortness of breath and leg swelling EXAM: PORTABLE CHEST 1 VIEW COMPARISON:  06/21/2020 FINDINGS: Cardiac shadow is stable. The lungs are well aerated bilaterally. No focal infiltrate or sizable effusion is seen. No bony abnormality is noted IMPRESSION: No acute abnormality noted. Electronically Signed   By: Alcide Clever M.D.   On: 10/16/2020 15:10     Time coordinating discharge: Over 30 minutes    Lewie Chamber, MD  [facility] 10/25/2020, 12:42 PM

## 2020-11-15 ENCOUNTER — Institutional Professional Consult (permissible substitution): Payer: Self-pay | Admitting: Pulmonary Disease

## 2020-12-03 MED FILL — CARVEDILOL 6.25 MG TABLET: 6.25 | 30 days supply | Qty: 60 | Fill #1

## 2020-12-03 MED FILL — FUROSEMIDE 40 MG TAB: 40 | 30 days supply | Qty: 60 | Fill #1

## 2020-12-03 MED FILL — LOSARTAN POTASSIUM 50 MG TA: 50 | 30 days supply | Qty: 30 | Fill #1

## 2021-01-01 ENCOUNTER — Encounter: Payer: Self-pay | Admitting: Pulmonary Disease

## 2021-01-01 ENCOUNTER — Other Ambulatory Visit: Payer: Self-pay

## 2021-01-01 ENCOUNTER — Other Ambulatory Visit: Payer: Self-pay | Admitting: Pulmonary Disease

## 2021-01-01 ENCOUNTER — Ambulatory Visit (INDEPENDENT_AMBULATORY_CARE_PROVIDER_SITE_OTHER): Payer: Self-pay | Admitting: Pulmonary Disease

## 2021-01-01 DIAGNOSIS — J9611 Chronic respiratory failure with hypoxia: Secondary | ICD-10-CM

## 2021-01-01 DIAGNOSIS — I5033 Acute on chronic diastolic (congestive) heart failure: Secondary | ICD-10-CM

## 2021-01-01 DIAGNOSIS — J9612 Chronic respiratory failure with hypercapnia: Secondary | ICD-10-CM

## 2021-01-01 DIAGNOSIS — E662 Morbid (severe) obesity with alveolar hypoventilation: Secondary | ICD-10-CM

## 2021-01-01 DIAGNOSIS — J45909 Unspecified asthma, uncomplicated: Secondary | ICD-10-CM

## 2021-01-01 DIAGNOSIS — J4540 Moderate persistent asthma, uncomplicated: Secondary | ICD-10-CM

## 2021-01-01 MED ORDER — FUROSEMIDE 40 MG PO TABS: ORAL_TABLET | ORAL | 1 refills | Status: DC

## 2021-01-01 MED ORDER — MOMETASONE FURO-FORMOTEROL FUM 100-5 MCG/ACT IN AERO: 2.0000 | INHALATION_SPRAY | Freq: Two times a day (BID) | RESPIRATORY_TRACT | 5 refills | Status: DC

## 2021-01-01 MED FILL — DULERA 100-5 MCG/ACT AERO: 100-5 | 30 days supply | Qty: 13 | Fill #0

## 2021-01-01 NOTE — Assessment & Plan Note (Signed)
He clearly has chronic hypercarbic respiratory failure on the basis of obesity hypoventilation. Whether COPD is contributing or not is not clear. He certainly needs a BiPAP machine or NIV ASAP unfortunately does not have insurance and DME will not provide him with the machine. I have asked him to try and obtain this online, have asked him to try and get a BiPAP machine and begin as DME to set up either auto BiPAP or temporary settings of 15/10 cm until we obtain a formal titration.  Continue on 2 L of oxygen during sleep

## 2021-01-01 NOTE — Progress Notes (Signed)
Subjective:    Patient ID: Joseph Morales, male    DOB: 09/18/1971, 50 y.o.   MRN: [id number]  HPI  Chief Complaint  Patient presents with  . Consult    Here to discuss cpap machine. Pt stated that he feels he needs to increase his fluid pills.  He feels the fluid is building back up in his legs and abdomen.    50 year old presents after recent hospitalization establish care for OSA/OHS after recent hospitalization for acute on chronic hypercarbic respiratory failure  He was hospitalized 6/24 to 06/26/2020 for acute diastolic heart failure, BNP low, echo showed normal LVEF, enlarged RV, diuresed with Lasix 10 L, weight decreased from 167 to 163 pounds but continued to require 2 L oxygen.  Dopplers were negative for DVT.  He was noted to have elevated CO2, outpatient sleep study recommended  He was again hospitalized 10/19-10/28 for progressive dyspnea and lower extremity swelling with acute hypercarbic respiratory failure, 7.1 6/117/55  again diuresed placed on BiPAP with good response.  Home trilogy was ordered but since he has no insurance he never obtained this. Labs 09/2020 BUN/creatinine 29/1.0  He reports a history of "asthma" all his life. He also smokes about a pack per day since age 89 but has now cut down after recent hospitalization to 5 to 6 cigarettes/day he states that since his discharge fluid is building up and he wonders if his dose of Lasix can be increased. He is established with [facility] and is able to get his medications.  He used to work at a [employer] but that made his breathing worse, he has now filed for disability but his Medicaid is not come through yet.   Epworth sleepiness score is 12. Bedtime is variable could be anywhere after 10 PM, TV stays on through the night, he sleeps on his side but mostly sitting up with 2-3 pillows, wakes up around 2 AM and is unable to sleep until 5 AM then sleeps for another 3 hours before getting out of bed by  9 AM feeling tired with dryness of mouth and occasional headaches There is no history suggestive of cataplexy, sleep paralysis or parasomnias  Significant tests/ events reviewed  CT angiogram chest 09/2020 neg PE   Past Medical History:  Diagnosis Date  . Asthma   . CHF (congestive heart failure) (HCC)   . Obesity    Past Surgical History:  Procedure Laterality Date  . NO PAST SURGERIES      No Known Allergies  Social History   Socioeconomic History  . Marital status: Single    Spouse name: Not on file  . Number of children: Not on file  . Years of education: Not on file  . Highest education level: Not on file  Occupational History  . Not on file  Tobacco Use  . Smoking status: Former Smoker    Packs/day: 0.50    Years: 20.00    Pack years: 10.00    Types: Cigarettes  . Smokeless tobacco: Never Used  Vaping Use  . Vaping Use: Never used  Substance and Sexual Activity  . Alcohol use: Never  . Drug use: Yes    Types: Marijuana    Comment: occasional marijuana  . Sexual activity: Not on file  Other Topics Concern  . Not on file  Social History Narrative  . Not on file   Social Determinants of Health   Financial Resource Strain: Not on file  Food Insecurity: Not on  file  Transportation Needs: Not on file  Physical Activity: Not on file  Stress: Not on file  Social Connections: Not on file  Intimate Partner Violence: Not on file     Family History  Problem Relation Age of Onset  . Breast cancer Mother   . Diabetes Mother   . Emphysema Maternal Grandmother   . Congestive Heart Failure Maternal Grandmother       Review of Systems  Left arm hurts, "they put smoking patch on  Me" increased fluid retention in legs increase shortness of breath occasional wheezing no chest pain, 3 pillow orthopnea, no proximal nocturnal dyspnea     Objective:   Physical Exam  Gen. Pleasant, obese, in no distress, normal affect ENT - no pallor,icterus, no post  nasal drip, class 2-3 airway Neck: No JVD, no thyromegaly, no carotid bruits Lungs: no use of accessory muscles, no dullness to percussion, decreased without rales or rhonchi  Cardiovascular: Rhythm regular, heart sounds  normal, no murmurs or gallops, 2+  peripheral edema Abdomen: soft and non-tender, no hepatosplenomegaly, BS normal. Musculoskeletal: No deformities, no cyanosis or clubbing Neuro:  alert, non focal, no tremors       Assessment & Plan:    Assessment:   . 1 or more chronic illnesses with severe exacerbation, progression, or side effects of treatment;  . 1 acute or chronic illness or injury that poses a threat to life or bodily function -obesity hypoventilation chronic hypoxic and hypercarbic respiratory failure  Plan Following Extensive Data Review & Interpretation:  . I reviewed prior external note(s) from hospital discharge summary . I reviewed the result(s) of labs, imaging . I have ordered home sleep test, titration study, BiPAP  Independent interpretation of tests . Review of patient's chest x-ray images revealed cardiomegaly. The patient's images have been independently reviewed by me.    Discussion of management or test interpretation with another colleague PCP.

## 2021-01-01 NOTE — Assessment & Plan Note (Addendum)
Not convinced with that he is to asthma eventually he will need PFTs. We will continue [facility] for now smoking cessation was again emphasized

## 2021-01-01 NOTE — Assessment & Plan Note (Signed)
We will increase his Lasix dose to 80 mg in the morning and 40 in the evening

## 2021-01-01 NOTE — Assessment & Plan Note (Signed)
We will proceed with home sleep testing and also schedule his titration study to avoid wasting time with this work-up. He will likely need BiPAP with oxygen blended in

## 2021-01-01 NOTE — Patient Instructions (Signed)
Refills on Dulera. Increase Lasix to 40 mg -2 tablets once in the morning and 1 tablet around 4 PM  Schedule home sleep test and titration study if there is a delay in your getting Medicaid, please try to obtain a BiPAP machine online -you can check out [email or web address] or other websites

## 2021-01-02 MED FILL — FUROSEMIDE 40 MG TAB: 40 | 30 days supply | Qty: 90 | Fill #0

## 2021-01-11 MED FILL — FUROSEMIDE 40 MG TAB: 40 | 30 days supply | Qty: 90 | Fill #0

## 2021-01-11 MED FILL — DULERA 100-5 MCG/ACT AERO: 100-5 | 30 days supply | Qty: 13 | Fill #0

## 2021-02-01 ENCOUNTER — Ambulatory Visit: Payer: Medicaid Other

## 2021-02-01 ENCOUNTER — Other Ambulatory Visit: Payer: Self-pay

## 2021-02-01 DIAGNOSIS — G4733 Obstructive sleep apnea (adult) (pediatric): Secondary | ICD-10-CM

## 2021-02-01 DIAGNOSIS — J9611 Chronic respiratory failure with hypoxia: Secondary | ICD-10-CM

## 2021-02-04 ENCOUNTER — Telehealth: Payer: Self-pay | Admitting: Pulmonary Disease

## 2021-02-05 NOTE — Telephone Encounter (Signed)
Called and spoke to pt's mother, Clara. She states the pt did his HST and is returning the machine today after 3:20pm.   Will forward to [facility] to make aware.

## 2021-02-06 NOTE — Telephone Encounter (Signed)
Noted  

## 2021-02-10 ENCOUNTER — Encounter (HOSPITAL_BASED_OUTPATIENT_CLINIC_OR_DEPARTMENT_OTHER): Payer: Self-pay | Admitting: Pulmonary Disease

## 2021-02-13 ENCOUNTER — Telehealth: Payer: Self-pay | Admitting: Pulmonary Disease

## 2021-02-13 DIAGNOSIS — G4733 Obstructive sleep apnea (adult) (pediatric): Secondary | ICD-10-CM | POA: Diagnosis not present

## 2021-02-13 NOTE — Telephone Encounter (Signed)
I am working on this one.

## 2021-02-13 NOTE — Telephone Encounter (Signed)
Called and went over HST result per Dr Vassie Loll with patient. All questions answered and patient expressed full understanding. Patient ok with CPAP titration study to be scheduled sooner. Will route to [facility] to try to get this scheduled sooner per Dr Vassie Loll.

## 2021-02-13 NOTE — Telephone Encounter (Signed)
HST showed severe OSA with AHI 39/ hr  CPAP titration study has been scheduled for 3/28.  Please see if sleep lab can schedule sooner

## 2021-02-15 NOTE — Telephone Encounter (Signed)
Left detailed message on pt's VM w/ the sleep center's phone number to call & see if they have any earlier appointments.  Pt cancelled his 2/13 cpap titration study.  Asked pt to call us back with any further questions.

## 2021-02-22 ENCOUNTER — Encounter: Payer: Self-pay | Admitting: Pulmonary Disease

## 2021-02-22 ENCOUNTER — Other Ambulatory Visit: Payer: Self-pay | Admitting: Pulmonary Disease

## 2021-02-22 ENCOUNTER — Ambulatory Visit (INDEPENDENT_AMBULATORY_CARE_PROVIDER_SITE_OTHER): Payer: Medicaid Other | Admitting: Pulmonary Disease

## 2021-02-22 ENCOUNTER — Other Ambulatory Visit: Payer: Self-pay

## 2021-02-22 VITALS — BP 138/76 | HR 103 | Ht 75.0 in | Wt 378.2 lb

## 2021-02-22 DIAGNOSIS — N529 Male erectile dysfunction, unspecified: Secondary | ICD-10-CM | POA: Diagnosis not present

## 2021-02-22 DIAGNOSIS — J9611 Chronic respiratory failure with hypoxia: Secondary | ICD-10-CM

## 2021-02-22 DIAGNOSIS — J45909 Unspecified asthma, uncomplicated: Secondary | ICD-10-CM

## 2021-02-22 DIAGNOSIS — E662 Morbid (severe) obesity with alveolar hypoventilation: Secondary | ICD-10-CM

## 2021-02-22 DIAGNOSIS — I5032 Chronic diastolic (congestive) heart failure: Secondary | ICD-10-CM

## 2021-02-22 DIAGNOSIS — J9612 Chronic respiratory failure with hypercapnia: Secondary | ICD-10-CM

## 2021-02-22 DIAGNOSIS — Z720 Tobacco use: Secondary | ICD-10-CM

## 2021-02-22 MED ORDER — BUDESONIDE-FORMOTEROL FUMARATE 160-4.5 MCG/ACT IN AERO: 2.0000 | INHALATION_SPRAY | Freq: Two times a day (BID) | RESPIRATORY_TRACT | 3 refills | Status: DC

## 2021-02-22 MED FILL — !SYMBICORT 160-4.5 MCG INH: 160-4.5 | 30 days supply | Qty: 1 | Fill #0

## 2021-02-22 NOTE — Assessment & Plan Note (Signed)
I called the sleep lab to expedite his titration study.  He very well might need BiPAP.  He does have severe OSA.  He does have hypercarbia on his ABGs.  We will await his titration study before ordering.  This will also enable [place] to see if he needs oxygen blended into his CPAP

## 2021-02-22 NOTE — Assessment & Plan Note (Signed)
Referral to urology

## 2021-02-22 NOTE — Assessment & Plan Note (Signed)
He reports a lifelong history of asthma.  Will obtain PFTs to clarify.  Is also a smoker so very well may have COPD We will switch him from [facility] to Symbicort for now while awaiting PFTs and then decide about long-term therapy

## 2021-02-22 NOTE — Patient Instructions (Signed)
[facility] CPAP titration study Referral to Int medicine Rx for symbicort  Schedule pFTs

## 2021-02-22 NOTE — Assessment & Plan Note (Signed)
He is on high-dose diuretics and weight is maintained. He is complaining of leg cramps and we will check be met to make sure he is not hypokalemic

## 2021-02-22 NOTE — Assessment & Plan Note (Signed)
Smoking cessation was emphasized is the most important intervention for his breathing issues

## 2021-02-22 NOTE — Progress Notes (Signed)
   Subjective:    Patient ID: Joseph Morales, male    DOB: 07/05/1971, 50 y.o.   MRN: [id number]  HPI  49-yo obese smoker for FU of OSA/OHS after hospitalization for acute on chronic hypercarbic respiratory failure He reports lifelong history of "asthma"  He was hospitalized 6/24 to 06/26/2020 for acute diastolic heart failure, BNP low, echo showed normal LVEF, enlarged RV, diuresed with Lasix 10 L, weight decreased from 167 to 163 kg but continued to require 2 L oxygen.  Dopplers were negative for DVT.  He was noted to have elevated CO2, outpatient sleep study recommended  He was again hospitalized 10/19-10/28 for progressive dyspnea and lower extremity swelling with acute hypercarbic respiratory failure, 7.1 6/117/55  again diuresed placed on BiPAP with good response.  Home trilogy was ordered but since he has no insurance he never obtained this.    Chief Complaint  Patient presents with  . Follow-up    Pt c/o stable sob with exertion, bl/ LE edema at ankles coming up to mid thigh.    33-month follow-up, we reviewed sleep study which showed severe OSA.  He has been set up for titration study on 3/28 He has cut down his smoking but is still about 3 to 5 cigarettes/day.  He complains of crampy feelings in his legs, compliant with Lasix 80 in the morning and 40 in the afternoon.  He has been unable to lose weight, he now has Medicaid and would like to establish with a PCP.  He also complains of erectile dysfunction and would like a referral    Significant tests/ events reviewed  HST 01/2021 AHI 39/h, lowest desat 63% CT angiogram chest 09/2020 neg PE  Review of Systems neg for any significant sore throat, dysphagia, itching, sneezing, nasal congestion or excess/ purulent secretions, fever, chills, sweats, unintended wt loss, pleuritic or exertional cp, hempoptysis, orthopnea pnd or change in chronic leg swelling. Also denies presyncope, palpitations, heartburn, abdominal pain, nausea,  vomiting, diarrhea or change in bowel or urinary habits, dysuria,hematuria, rash, arthralgias, visual complaints, headache, numbness weakness or ataxia.     Objective:   Physical Exam  Gen. Pleasant, obese, in no distress ENT - no lesions, no post nasal drip Neck: No JVD, no thyromegaly, no carotid bruits Lungs: no use of accessory muscles, no dullness to percussion, decreased without rales or rhonchi  Cardiovascular: Rhythm regular, heart sounds  normal, no murmurs or gallops, 1+ peripheral edema Musculoskeletal: No deformities, no cyanosis or clubbing , no tremors       Assessment & Plan:

## 2021-03-01 ENCOUNTER — Other Ambulatory Visit: Payer: Self-pay

## 2021-03-01 ENCOUNTER — Ambulatory Visit (INDEPENDENT_AMBULATORY_CARE_PROVIDER_SITE_OTHER): Payer: Medicaid Other | Admitting: Pulmonary Disease

## 2021-03-01 DIAGNOSIS — J9612 Chronic respiratory failure with hypercapnia: Secondary | ICD-10-CM | POA: Diagnosis not present

## 2021-03-01 DIAGNOSIS — J45909 Unspecified asthma, uncomplicated: Secondary | ICD-10-CM

## 2021-03-01 DIAGNOSIS — J9611 Chronic respiratory failure with hypoxia: Secondary | ICD-10-CM

## 2021-03-01 DIAGNOSIS — E662 Morbid (severe) obesity with alveolar hypoventilation: Secondary | ICD-10-CM

## 2021-03-01 LAB — PULMONARY FUNCTION TEST
DL/VA % pred: 128 %
DL/VA: 5.63 ml/min/mmHg/L
DLCO cor % pred: 80 %
DLCO cor: 28.88 ml/min/mmHg
DLCO unc % pred: 80 %
DLCO unc: 28.88 ml/min/mmHg
FEF 25-75 Post: 1.14 L/sec
FEF 25-75 Pre: 0.58 L/sec
FEF2575-%Change-Post: 96 %
FEF2575-%Pred-Post: 28 %
FEF2575-%Pred-Pre: 14 %
FEV1-%Change-Post: 29 %
FEV1-%Pred-Post: 35 %
FEV1-%Pred-Pre: 27 %
FEV1-Post: 1.47 L
FEV1-Pre: 1.14 L
FEV1FVC-%Change-Post: 3 %
FEV1FVC-%Pred-Pre: 70 %
FEV6-%Change-Post: 25 %
FEV6-%Pred-Post: 49 %
FEV6-%Pred-Pre: 39 %
FEV6-Post: 2.51 L
FEV6-Pre: 2.01 L
FEV6FVC-%Pred-Post: 102 %
FEV6FVC-%Pred-Pre: 102 %
FVC-%Change-Post: 25 %
FVC-%Pred-Post: 48 %
FVC-%Pred-Pre: 38 %
FVC-Post: 2.51 L
FVC-Pre: 2.01 L
Post FEV1/FVC ratio: 59 %
Post FEV6/FVC ratio: 100 %
Pre FEV1/FVC ratio: 57 %
Pre FEV6/FVC Ratio: 100 %
RV % pred: 195 %
RV: 4.51 L
TLC % pred: 81 %
TLC: 6.62 L

## 2021-03-01 NOTE — Progress Notes (Signed)
Full PFT performed today. Patient unable to keep frequency for PLETH therefore N2 performed.

## 2021-03-01 NOTE — Patient Instructions (Signed)
Full PFT performed today. °

## 2021-03-19 MED FILL — SYMBICORT 160-4.5 MCG INH: 160-4.5 | 30 days supply | Qty: 10 | Fill #0

## 2021-03-19 MED FILL — FUROSEMIDE 40 MG TAB: 40 | 30 days supply | Qty: 90 | Fill #1

## 2021-03-25 ENCOUNTER — Ambulatory Visit (HOSPITAL_BASED_OUTPATIENT_CLINIC_OR_DEPARTMENT_OTHER): Payer: Medicaid Other | Attending: Pulmonary Disease | Admitting: Pulmonary Disease

## 2021-03-25 ENCOUNTER — Other Ambulatory Visit: Payer: Self-pay

## 2021-03-25 DIAGNOSIS — J9611 Chronic respiratory failure with hypoxia: Secondary | ICD-10-CM | POA: Insufficient documentation

## 2021-03-25 DIAGNOSIS — E662 Morbid (severe) obesity with alveolar hypoventilation: Secondary | ICD-10-CM

## 2021-03-25 DIAGNOSIS — G4733 Obstructive sleep apnea (adult) (pediatric): Secondary | ICD-10-CM | POA: Insufficient documentation

## 2021-03-25 DIAGNOSIS — J9612 Chronic respiratory failure with hypercapnia: Secondary | ICD-10-CM | POA: Diagnosis not present

## 2021-03-26 ENCOUNTER — Telehealth: Payer: Self-pay | Admitting: Pulmonary Disease

## 2021-03-26 DIAGNOSIS — J9611 Chronic respiratory failure with hypoxia: Secondary | ICD-10-CM

## 2021-03-26 DIAGNOSIS — J9612 Chronic respiratory failure with hypercapnia: Secondary | ICD-10-CM

## 2021-03-26 DIAGNOSIS — E662 Morbid (severe) obesity with alveolar hypoventilation: Secondary | ICD-10-CM | POA: Diagnosis not present

## 2021-03-26 DIAGNOSIS — G4733 Obstructive sleep apnea (adult) (pediatric): Secondary | ICD-10-CM

## 2021-03-26 NOTE — Telephone Encounter (Signed)
Pl send Rx for   Auto BiPAP IPAP max 25, EPAP min 12 PS +4 cm ,with a Large size Resmed Full Face Mask AirFit F20 mask and heated humidification. - 3L O2 should be blended in  OV with me in 4 wks after starting

## 2021-03-26 NOTE — Procedures (Signed)
Patient Name: Joseph Morales, Slatter Date: 03/25/2021 Gender: Male D.O.B: [birth year] Age (years): 27 Referring Provider: Cyril Mourning MD, ABSM Height (inches): 75 Interpreting Physician: Cyril Mourning MD, ABSM Weight (lbs): 374 RPSGT: Ulyess Mort BMI: 47 MRN: [id number] Neck Size: 21.00 <br> <br> CLINICAL INFORMATION The patient is referred for a BiPAP titration to treat sleep apnea.   HST 01/2021 AHI 39/h, lowest desat 63%  SLEEP STUDY TECHNIQUE As per the AASM Manual for the Scoring of Sleep and Associated Events v2.3 (April 2016) with a hypopnea requiring 4% desaturations.  The channels recorded and monitored were frontal, central and occipital EEG, electrooculogram (EOG), submentalis EMG (chin), nasal and oral airflow, thoracic and abdominal wall motion, anterior tibialis EMG, snore microphone, electrocardiogram, and pulse oximetry. Bilevel positive airway pressure (BPAP) was initiated at the beginning of the study and titrated to treat sleep-disordered breathing.  MEDICATIONS Medications self-administered by patient taken the night of the study : N/A  RESPIRATORY PARAMETERS Optimal IPAP Pressure (cm): 25 AHI at Optimal Pressure (/hr) 0 Optimal EPAP Pressure (cm): 21 Oxygen 3L Overall Minimal O2 (%): 68.0 Minimal O2 at Optimal Pressure (%): 83.0 SLEEP ARCHITECTURE Start Time: 10:32:11 PM Stop Time: 4:42:37 AM Total Time (min): 370.4 Total Sleep Time (min): 173 Sleep Latency (min): 19.4 Sleep Efficiency (%): 46.7% REM Latency (min): 261.5 WASO (min): 178.1 Stage N1 (%): 39.3% Stage N2 (%): 43.6% Stage N3 (%): 0.0% Stage R (%): 17.1 Supine (%): 18.21 Arousal Index (/hr): 32.3     CARDIAC DATA The 2 lead EKG demonstrated sinus rhythm. The mean heart rate was 93.4 beats per minute. Other EKG findings include: None. LEG MOVEMENT DATA The total Periodic Limb Movements of Sleep (PLMS) were 0. The PLMS index was 0.0. A PLMS index of <15 is considered normal in  adults.  IMPRESSIONS - An optimal PAP pressure was selected for this patient ( 25 / 21 cm of water) - Severe oxygen desaturations were observed during this titration (min O2 = 68.0%). 3 L oxygen was required during the study - The patient snored with soft snoring volume. - No cardiac abnormalities were observed during this study. - Clinically significant periodic limb movements were not noted during this study. Arousals associated with PLMs were rare.   DIAGNOSIS - Obstructive Sleep Apnea (G47.33)  RECOMMENDATIONS - Trial of BiPAP therapy on 25/21 cm H2O with a Large size Resmed Full Face Mask AirFit F20 mask and heated humidification. - 3L O2 should be blended in - Avoid alcohol, sedatives and other CNS depressants that may worsen sleep apnea and disrupt normal sleep architecture. - Sleep hygiene should be reviewed to assess factors that may improve sleep quality. - Weight management and regular exercise should be initiated or continued. - Return to Sleep Center for re-evaluation after 4 weeks of therapy   Cyril Mourning MD Board Certified in Sleep medicine

## 2021-03-27 NOTE — Telephone Encounter (Signed)
Called and went over Dr Reginia Naas recommendations for Bipap with O2 blended in. Patient stated he is already on O2 from the hospital and it came from [employer]. Orders placed per Dr Vassie Loll. Patient expressed full understanding to call and schedule office visit with Dr Vassie Loll 4 weeks after starting Bipap. Nothing further needed at this time.

## 2021-05-18 ENCOUNTER — Encounter (HOSPITAL_COMMUNITY): Payer: Self-pay | Admitting: Emergency Medicine

## 2021-05-18 ENCOUNTER — Other Ambulatory Visit: Payer: Self-pay

## 2021-05-18 ENCOUNTER — Emergency Department (HOSPITAL_COMMUNITY): Payer: Medicaid Other

## 2021-05-18 ENCOUNTER — Inpatient Hospital Stay (HOSPITAL_COMMUNITY)
Admission: EM | Admit: 2021-05-18 | Discharge: 2021-05-21 | DRG: 189 | Disposition: A | Discharge status: Home / Self Care | Payer: Medicaid Other | Attending: Internal Medicine | Admitting: Internal Medicine

## 2021-05-18 DIAGNOSIS — E662 Morbid (severe) obesity with alveolar hypoventilation: Secondary | ICD-10-CM | POA: Diagnosis present

## 2021-05-18 DIAGNOSIS — J9621 Acute and chronic respiratory failure with hypoxia: Secondary | ICD-10-CM | POA: Diagnosis present

## 2021-05-18 DIAGNOSIS — Z20822 Contact with and (suspected) exposure to covid-19: Secondary | ICD-10-CM | POA: Diagnosis present

## 2021-05-18 DIAGNOSIS — J9692 Respiratory failure, unspecified with hypercapnia: Secondary | ICD-10-CM | POA: Diagnosis present

## 2021-05-18 DIAGNOSIS — Z6841 Body Mass Index (BMI) 40.0 and over, adult: Secondary | ICD-10-CM | POA: Diagnosis not present

## 2021-05-18 DIAGNOSIS — I5033 Acute on chronic diastolic (congestive) heart failure: Secondary | ICD-10-CM | POA: Diagnosis present

## 2021-05-18 DIAGNOSIS — R739 Hyperglycemia, unspecified: Secondary | ICD-10-CM | POA: Diagnosis present

## 2021-05-18 DIAGNOSIS — Z8249 Family history of ischemic heart disease and other diseases of the circulatory system: Secondary | ICD-10-CM

## 2021-05-18 DIAGNOSIS — Z825 Family history of asthma and other chronic lower respiratory diseases: Secondary | ICD-10-CM

## 2021-05-18 DIAGNOSIS — Z79899 Other long term (current) drug therapy: Secondary | ICD-10-CM | POA: Diagnosis not present

## 2021-05-18 DIAGNOSIS — Z7951 Long term (current) use of inhaled steroids: Secondary | ICD-10-CM

## 2021-05-18 DIAGNOSIS — G4733 Obstructive sleep apnea (adult) (pediatric): Secondary | ICD-10-CM | POA: Diagnosis not present

## 2021-05-18 DIAGNOSIS — J9622 Acute and chronic respiratory failure with hypercapnia: Principal | ICD-10-CM | POA: Diagnosis present

## 2021-05-18 DIAGNOSIS — F17210 Nicotine dependence, cigarettes, uncomplicated: Secondary | ICD-10-CM | POA: Diagnosis present

## 2021-05-18 DIAGNOSIS — I110 Hypertensive heart disease with heart failure: Secondary | ICD-10-CM | POA: Diagnosis present

## 2021-05-18 DIAGNOSIS — I872 Venous insufficiency (chronic) (peripheral): Secondary | ICD-10-CM | POA: Diagnosis present

## 2021-05-18 DIAGNOSIS — J45909 Unspecified asthma, uncomplicated: Secondary | ICD-10-CM

## 2021-05-18 DIAGNOSIS — Z833 Family history of diabetes mellitus: Secondary | ICD-10-CM | POA: Diagnosis not present

## 2021-05-18 DIAGNOSIS — J9601 Acute respiratory failure with hypoxia: Secondary | ICD-10-CM

## 2021-05-18 DIAGNOSIS — E785 Hyperlipidemia, unspecified: Secondary | ICD-10-CM | POA: Diagnosis present

## 2021-05-18 DIAGNOSIS — J441 Chronic obstructive pulmonary disease with (acute) exacerbation: Secondary | ICD-10-CM | POA: Diagnosis present

## 2021-05-18 LAB — COMPREHENSIVE METABOLIC PANEL
ALT: 80 U/L — ABNORMAL HIGH (ref 0–44)
AST: 53 U/L — ABNORMAL HIGH (ref 15–41)
Albumin: 3.6 g/dL (ref 3.5–5.0)
Alkaline Phosphatase: 64 U/L (ref 38–126)
Anion gap: 6 (ref 5–15)
BUN: 26 mg/dL — ABNORMAL HIGH (ref 6–20)
CO2: 38 mmol/L — ABNORMAL HIGH (ref 22–32)
Calcium: 8.5 mg/dL — ABNORMAL LOW (ref 8.9–10.3)
Chloride: 94 mmol/L — ABNORMAL LOW (ref 98–111)
Creatinine, Ser: 1.15 mg/dL (ref 0.61–1.24)
GFR, Estimated: >60 mL/min (ref >60)
Glucose, Bld: 206 mg/dL — ABNORMAL HIGH (ref 70–99)
Potassium: 4.7 mmol/L (ref 3.5–5.1)
Sodium: 138 mmol/L (ref 135–145)
Total Bilirubin: 1.8 mg/dL — ABNORMAL HIGH (ref 0.3–1.2)
Total Protein: 7.6 g/dL (ref 6.5–8.1)

## 2021-05-18 LAB — BLOOD GAS, VENOUS
Acid-Base Excess: 8.6 mmol/L — ABNORMAL HIGH (ref 0.0–2.0)
Acid-Base Excess: 11 mmol/L — ABNORMAL HIGH (ref 0.0–2.0)
Bicarbonate: 41 mmol/L — ABNORMAL HIGH (ref 20.0–28.0)
Bicarbonate: 43.8 mmol/L — ABNORMAL HIGH (ref 20.0–28.0)
FIO2: 21
O2 Saturation: 95.4 %
O2 Saturation: 70.2 %
Patient temperature: 98.6
Patient temperature: 98.6
pCO2, Ven: 104 mmHg (ref 44.0–60.0)
pCO2, Ven: 109 mmHg (ref 44.0–60.0)
pH, Ven: 7.221 — ABNORMAL LOW (ref 7.250–7.430)
pH, Ven: 7.229 — ABNORMAL LOW (ref 7.250–7.430)
pO2, Ven: 98.5 mmHg — ABNORMAL HIGH (ref 32.0–45.0)
pO2, Ven: 46.2 mmHg — ABNORMAL HIGH (ref 32.0–45.0)

## 2021-05-18 LAB — I-STAT CHEM 8, ED
BUN: 27 mg/dL — ABNORMAL HIGH (ref 6–20)
Calcium, Ion: 1.16 mmol/L (ref 1.15–1.40)
Chloride: 91 mmol/L — ABNORMAL LOW (ref 98–111)
Creatinine, Ser: 1.2 mg/dL (ref 0.61–1.24)
Glucose, Bld: 209 mg/dL — ABNORMAL HIGH (ref 70–99)
HCT: 50 % (ref 39.0–52.0)
Hemoglobin: 17 g/dL (ref 13.0–17.0)
Potassium: 4.6 mmol/L (ref 3.5–5.1)
Sodium: 137 mmol/L (ref 135–145)
TCO2: 39 mmol/L — ABNORMAL HIGH (ref 22–32)

## 2021-05-18 LAB — CBC WITH DIFFERENTIAL/PLATELET
Abs Immature Granulocytes: 0.47 10*3/uL — ABNORMAL HIGH (ref 0.00–0.07)
Basophils Absolute: 0.1 10*3/uL (ref 0.0–0.1)
Basophils Relative: 1 %
Eosinophils Absolute: 0.2 10*3/uL (ref 0.0–0.5)
Eosinophils Relative: 1 %
HCT: 49.2 % (ref 39.0–52.0)
Hemoglobin: 14.5 g/dL (ref 13.0–17.0)
Immature Granulocytes: 2 %
Lymphocytes Relative: 13 %
Lymphs Abs: 3.2 10*3/uL (ref 0.7–4.0)
MCH: 29.4 pg (ref 26.0–34.0)
MCHC: 29.5 g/dL — ABNORMAL LOW (ref 30.0–36.0)
MCV: 99.6 fL (ref 80.0–100.0)
Monocytes Absolute: 1.5 10*3/uL — ABNORMAL HIGH (ref 0.1–1.0)
Monocytes Relative: 6 %
Neutro Abs: 20 10*3/uL — ABNORMAL HIGH (ref 1.7–7.7)
Neutrophils Relative %: 77 %
Platelets: 247 10*3/uL (ref 150–400)
RBC: 4.94 MIL/uL (ref 4.22–5.81)
RDW: 14.7 % (ref 11.5–15.5)
WBC: 25.4 10*3/uL — ABNORMAL HIGH (ref 4.0–10.5)
nRBC: 1.1 % — ABNORMAL HIGH (ref 0.0–0.2)

## 2021-05-18 LAB — RESP PANEL BY RT-PCR (FLU A&B, COVID) ARPGX2
Influenza A by PCR: NEGATIVE
Influenza B by PCR: NEGATIVE
SARS Coronavirus 2 by RT PCR: NEGATIVE

## 2021-05-18 LAB — BLOOD GAS, ARTERIAL
Acid-Base Excess: 9.9 mmol/L — ABNORMAL HIGH (ref 0.0–2.0)
Bicarbonate: 41.4 mmol/L — ABNORMAL HIGH (ref 20.0–28.0)
FIO2: 35
O2 Saturation: 86.8 %
Patient temperature: 98.9
pCO2 arterial: 96.2 mmHg (ref 32.0–48.0)
pH, Arterial: 7.256 — ABNORMAL LOW (ref 7.350–7.450)
pO2, Arterial: 62 mmHg — ABNORMAL LOW (ref 83.0–108.0)

## 2021-05-18 LAB — TROPONIN I (HIGH SENSITIVITY)
Troponin I (High Sensitivity): 39 ng/L — ABNORMAL HIGH (ref <18)
Troponin I (High Sensitivity): 33 ng/L — ABNORMAL HIGH (ref <18)

## 2021-05-18 LAB — BRAIN NATRIURETIC PEPTIDE: B Natriuretic Peptide: 207.3 pg/mL — ABNORMAL HIGH (ref 0.0–100.0)

## 2021-05-18 MED ORDER — METHYLPREDNISOLONE SODIUM SUCC 125 MG IJ SOLR
60.0000 mg | Freq: Two times a day (BID) | INTRAMUSCULAR | Status: DC
  Administered 2021-05-19 – 2021-05-20 (×3): 60 mg via INTRAVENOUS
  Filled 2021-05-18 (×3): qty 2

## 2021-05-18 MED ORDER — NITROGLYCERIN IN D5W 200-5 MCG/ML-% IV SOLN
40.0000 ug/min | INTRAVENOUS | Status: DC
  Administered 2021-05-18: 40 ug/min via INTRAVENOUS
  Filled 2021-05-18: qty 250

## 2021-05-18 MED ORDER — ORAL CARE MOUTH RINSE
15.0000 mL | Freq: Two times a day (BID) | OROMUCOSAL | Status: DC
  Administered 2021-05-18 – 2021-05-21 (×6): 15 mL via OROMUCOSAL

## 2021-05-18 MED ORDER — ENOXAPARIN SODIUM 40 MG/0.4ML IJ SOSY
40.0000 mg | PREFILLED_SYRINGE | INTRAMUSCULAR | Status: DC
  Administered 2021-05-18: 40 mg via SUBCUTANEOUS
  Filled 2021-05-18: qty 0.4

## 2021-05-18 MED ORDER — SODIUM CHLORIDE 0.9 % IV SOLN
500.0000 mg | INTRAVENOUS | Status: DC
  Administered 2021-05-18 – 2021-05-20 (×3): 500 mg via INTRAVENOUS
  Filled 2021-05-18 (×3): qty 500

## 2021-05-18 MED ORDER — METHYLPREDNISOLONE SODIUM SUCC 125 MG IJ SOLR
125.0000 mg | Freq: Once | INTRAMUSCULAR | Status: AC
  Administered 2021-05-18: 125 mg via INTRAVENOUS
  Filled 2021-05-18: qty 2

## 2021-05-18 MED ORDER — IPRATROPIUM-ALBUTEROL 0.5-2.5 (3) MG/3ML IN SOLN
3.0000 mL | Freq: Four times a day (QID) | RESPIRATORY_TRACT | Status: DC
  Administered 2021-05-18 – 2021-05-21 (×11): 3 mL via RESPIRATORY_TRACT
  Filled 2021-05-18 (×10): qty 3

## 2021-05-18 MED ORDER — DOCUSATE SODIUM 100 MG PO CAPS
100.0000 mg | ORAL_CAPSULE | Freq: Two times a day (BID) | ORAL | Status: DC | PRN
Start: 2021-05-18 — End: 2021-05-21

## 2021-05-18 MED ORDER — FUROSEMIDE 10 MG/ML IJ SOLN
40.0000 mg | Freq: Two times a day (BID) | INTRAMUSCULAR | Status: DC
  Administered 2021-05-18 – 2021-05-21 (×6): 40 mg via INTRAVENOUS
  Filled 2021-05-18 (×6): qty 4

## 2021-05-18 MED ORDER — SODIUM CHLORIDE 0.9 % IV SOLN
1.0000 g | INTRAVENOUS | Status: DC
  Administered 2021-05-18 – 2021-05-20 (×3): 1 g via INTRAVENOUS
  Filled 2021-05-18 (×4): qty 10

## 2021-05-18 MED ORDER — POLYETHYLENE GLYCOL 3350 17 G PO PACK: 17.0000 g | PACK | Freq: Every day | ORAL | Status: DC | PRN

## 2021-05-18 MED ORDER — NITROGLYCERIN IN D5W 200-5 MCG/ML-% IV SOLN: 0.0000 ug/min | INTRAVENOUS | Status: DC

## 2021-05-18 MED ORDER — FUROSEMIDE 10 MG/ML IJ SOLN
40.0000 mg | Freq: Once | INTRAMUSCULAR | Status: AC
  Administered 2021-05-18: 40 mg via INTRAVENOUS
  Filled 2021-05-18: qty 4

## 2021-05-18 MED ORDER — IPRATROPIUM BROMIDE 0.02 % IN SOLN
0.5000 mg | Freq: Once | RESPIRATORY_TRACT | Status: AC
  Administered 2021-05-18: 0.5 mg via RESPIRATORY_TRACT
  Filled 2021-05-18: qty 2.5

## 2021-05-18 MED ORDER — ALBUTEROL (5 MG/ML) CONTINUOUS INHALATION SOLN
10.0000 mg/h | INHALATION_SOLUTION | RESPIRATORY_TRACT | Status: AC
  Administered 2021-05-18: 10 mg/h via RESPIRATORY_TRACT
  Filled 2021-05-18: qty 20

## 2021-05-18 MED ORDER — CHLORHEXIDINE GLUCONATE CLOTH 2 % EX PADS
6.0000 | MEDICATED_PAD | Freq: Every day | CUTANEOUS | Status: DC
  Administered 2021-05-18 – 2021-05-20 (×3): 6 via TOPICAL

## 2021-05-18 NOTE — Progress Notes (Addendum)
eLink Physician-Brief Progress Note Patient Name: Joseph Morales DOB: 1971/02/07 MRN: [id number]   Date of Service  05/18/2021  HPI/Events of Note  Brief new admit note:    51 male with hx of Obesity, CHF and asthma admitted to ICU on BiPAP for type 2 resp failure. Multiple admits in past for diastolic chf.   Camera: Obese. VS: HR 98, sats 91%, MAP 78  BiPAP 26/10/24/35%m Ve 12.3   Data: Reviewed.  CxR CHF 7.22/109 ( increased from 104 ) at 1831 Trop trending down BNP 207 Wbc 25 K Covid/flu neg.   A/P:   Type 2 resp failure from Decompensated diastolic  And asthma exacerbation. Uncontrolled HTN.Morbid obesity associated hypoventilation. - s/p lasix, BiPAP- rate increased by RT. Follow ABG. On CAP coverage - asp precautions- encephalopathy, able to protect airways.  - nebs, steroids    eICU Interventions  - Lovenox as VTE ordered.  - CBG goals < 180 while on steroids - ABG just popped up: improving pco2 < 100.      Intervention Category Major Interventions: Respiratory failure - evaluation and management Evaluation Type: New Patient Evaluation  Ranee Gosselin 05/18/2021, 8:46 PM

## 2021-05-18 NOTE — Progress Notes (Signed)
Repeat venous blood gas noted  pH is 7.229, PCO2 did go up by 4  Patient remains arousable and interactive but falls asleep if not interacted with  BiPAP adjusted Increased rate to 25 from 20, PEEP decreased to 10  Improved tidal volumes noted  Will repeat ABG at 8:00 PM

## 2021-05-18 NOTE — ED Notes (Signed)
RT notified of pt in respiratory distress. RT bedside

## 2021-05-18 NOTE — ED Notes (Signed)
Called report to Thurmon Fair, RN on 2W

## 2021-05-18 NOTE — ED Triage Notes (Signed)
Patient c/o SOB for the last several weeks. He reports having a history of CHF and reports this episode feels similar. He states he has noticed weight gain and bilateral edema over the past few days. He denies chest pain. He reports SOB worsens w/ exertion.

## 2021-05-18 NOTE — ED Notes (Signed)
Pt placed on 15L NR

## 2021-05-18 NOTE — ED Notes (Signed)
BiPaP O2% adjusted to 35% per RT due to 85% O2 sat

## 2021-05-18 NOTE — ED Provider Notes (Signed)
[facility]-EMERGENCY DEPT Provider Note   CSN: [id number] Arrival date & time: 05/18/21  1603     History Chief Complaint  Patient presents with  . Shortness of Breath    Joseph Morales is a 50 y.o. male with a past medical history significant for chronic diastolic congestive heart failure (EF 70 to 75%), asthma, hypertension, hyperlipidemia, tobacco use, and suspected OHS who presents to the ED in respiratory distress with O2 saturation in the 40s on room air.  Patient admits to worsening shortness of breath and lower extremity edema for the past week. Symptoms have progressively worsened. He is currently on Lasix 40 mg which he has been compliant with.  Denies associated chest pain.  Denies cough, fever, chills, rhinorrhea, and sore throat.  Denies sick contacts known COVID exposures.  Patient is chronically on 2 L nasal cannula at night and is working to get a Surveyor, minerals. Patient is followed  By Dr. Vassie Loll with pulmonology. No treatment prior to arrival. No aggravating or alleviating factors.   History obtained from patient and past medical records. No interpreter used during encounter.      Past Medical History:  Diagnosis Date  . Asthma   . CHF (congestive heart failure) (HCC)   . Obesity     Patient Active Problem List   Diagnosis Date Noted  . Erectile dysfunction 02/22/2021  . Tobacco abuse 10/24/2020  . Chronic respiratory failure with hypoxia and hypercapnia (HCC) 10/16/2020  . Essential hypertension 10/16/2020  . Hyperlipidemia 10/16/2020  . Asthma 06/21/2020  . Chronic diastolic heart failure (HCC) 06/21/2020  . Hypoxia 06/21/2020  . Orthopnea 06/21/2020  . Chronic venous insufficiency 06/21/2020  . Cellulitis 06/21/2020  . Obesity, Class III, BMI 40-49.9 (morbid obesity) (HCC) 06/21/2020  . Obesity hypoventilation syndrome (HCC) 06/21/2020  . Leucocytosis 06/21/2020  . Chronic respiratory alkalosis 06/21/2020  . Hypertensive urgency  06/21/2020  . Calf tenderness 06/21/2020    Past Surgical History:  Procedure Laterality Date  . NO PAST SURGERIES         Family History  Problem Relation Age of Onset  . Breast cancer Mother   . Diabetes Mother   . Emphysema Maternal Grandmother   . Congestive Heart Failure Maternal Grandmother     Social History   Tobacco Use  . Smoking status: Current Every Day Smoker    Packs/day: 1.00    Years: 20.00    Pack years: 20.00    Types: Cigarettes  . Smokeless tobacco: Never Used  . Tobacco comment: down to 2-3 cigarettes daily 02/22/21  Vaping Use  . Vaping Use: Never used  Substance Use Topics  . Alcohol use: Never  . Drug use: Yes    Types: Marijuana    Comment: occasional marijuana    Home Medications Prior to Admission medications   Medication Sig Start Date End Date Taking? Authorizing Provider  albuterol (VENTOLIN HFA) 108 (90 Base) MCG/ACT inhaler Inhale 2 puffs into the lungs every 6 (six) hours as needed for wheezing or shortness of breath. 08/01/20   Anders Simmonds, PA-C  budesonide-formoterol (SYMBICORT) 160-4.5 MCG/ACT inhaler INHALE 2 PUFFS INTO THE LUNGS IN THE MORNING AND AT BEDTIME. 02/22/21 02/22/22  Oretha Milch, MD  furosemide (LASIX) 40 MG tablet TAKE 80 MG (2 TABLETS) IN THE MORNING 40MG  (ONE TABLET) IN THE AFTERNOON 01/01/21 01/01/22  03/01/22, MD  mometasone-formoterol (DULERA) 100-5 MCG/ACT AERO INHALE 2 PUFFS INTO THE LUNGS 2 (TWO) TIMES DAILY. 01/01/21 01/01/22  03/01/22,  Comer Locketakesh V, MD  carvedilol (COREG) 6.25 MG tablet Take 1 tablet (6.25 mg total) by mouth 2 (two) times daily. Patient not taking: Reported on 02/22/2021 10/25/20 02/22/21  Lewie ChamberGirguis, David, MD  losartan (COZAAR) 50 MG tablet Take 1 tablet (50 mg total) by mouth daily. Patient not taking: Reported on 02/22/2021 10/25/20 02/22/21  Lewie ChamberGirguis, David, MD    Allergies    Patient has no known allergies.  Review of Systems   Review of Systems  Constitutional: Negative for chills and fever.   Respiratory: Positive for shortness of breath. Negative for cough.   Cardiovascular: Positive for leg swelling. Negative for chest pain.  Gastrointestinal: Negative for abdominal pain, diarrhea, nausea and vomiting.  All other systems reviewed and are negative.   Physical Exam Updated Vital Signs BP (!) 160/79   Pulse (!) 117   Temp 98.6 F (37 C) (Oral)   Resp (!) 31   Wt 122.5 kg   SpO2 90%   BMI 33.75 kg/m   Physical Exam Vitals and nursing note reviewed.  Constitutional:      General: He is in acute distress.     Appearance: He is ill-appearing.  HENT:     Head: Normocephalic.  Eyes:     Pupils: Pupils are equal, round, and reactive to light.  Cardiovascular:     Rate and Rhythm: Normal rate and regular rhythm.     Pulses: Normal pulses.     Heart sounds: Normal heart sounds. No murmur heard. No friction rub. No gallop.   Pulmonary:     Breath sounds: Wheezing present.     Comments: Diminished breath sounds. Expiratory wheeze. Increased work of breathing. O2 saturation in 40s on room air above arrival that increased to 90s after NRB.  Abdominal:     General: Abdomen is flat. There is no distension.     Palpations: Abdomen is soft.     Tenderness: There is no abdominal tenderness. There is no guarding or rebound.  Musculoskeletal:        General: Normal range of motion.     Cervical back: Neck supple.     Comments: 2+ pitting edema bilaterally.  Skin:    General: Skin is warm and dry.  Neurological:     General: No focal deficit present.     Mental Status: He is alert.     Comments: Patient lethargic. Oriented x4.  Psychiatric:        Mood and Affect: Mood normal.        Behavior: Behavior normal.     ED Results / Procedures / Treatments   Labs (all labs ordered are listed, but only abnormal results are displayed) Labs Reviewed  CBC WITH DIFFERENTIAL/PLATELET - Abnormal; Notable for the following components:      Result Value   WBC 25.4 (*)    MCHC  29.5 (*)    nRBC 1.1 (*)    Neutro Abs 20.0 (*)    Monocytes Absolute 1.5 (*)    Abs Immature Granulocytes 0.47 (*)    All other components within normal limits  COMPREHENSIVE METABOLIC PANEL - Abnormal; Notable for the following components:   Chloride 94 (*)    CO2 38 (*)    Glucose, Bld 206 (*)    BUN 26 (*)    Calcium 8.5 (*)    AST 53 (*)    ALT 80 (*)    Total Bilirubin 1.8 (*)    All other components within normal limits  BRAIN NATRIURETIC PEPTIDE -  Abnormal; Notable for the following components:   B Natriuretic Peptide 207.3 (*)    All other components within normal limits  BLOOD GAS, VENOUS - Abnormal; Notable for the following components:   pH, Ven 7.221 (*)    pCO2, Ven 104 (*)    pO2, Ven 98.5 (*)    Bicarbonate 41.0 (*)    Acid-Base Excess 8.6 (*)    All other components within normal limits  I-STAT CHEM 8, ED - Abnormal; Notable for the following components:   Chloride 91 (*)    BUN 27 (*)    Glucose, Bld 209 (*)    TCO2 39 (*)    All other components within normal limits  TROPONIN I (HIGH SENSITIVITY) - Abnormal; Notable for the following components:   Troponin I (High Sensitivity) 39 (*)    All other components within normal limits  RESP PANEL BY RT-PCR (FLU A&B, COVID) [id number]    EKG EKG Interpretation  Date/Time:  Saturday May 18 2021 16:25:05 EDT Ventricular Rate:  111 PR Interval:  137 QRS Duration: 95 QT Interval:  324 QTC Calculation: 441 R Axis:   98 Text Interpretation: Sinus tachycardia Probable left atrial enlargement Borderline right axis deviation Minimal ST elevation, lateral leads Confirmed by Tilden Fossa [phone]) on 05/18/2021 4:47:22 PM   Radiology DG Chest Portable 1 View  Result Date: 05/18/2021 CLINICAL DATA:  Shortness of breath, CHF and weight gain with bilateral edema. EXAM: PORTABLE CHEST 1 VIEW COMPARISON:  October 19, 2020. FINDINGS: Cardiomediastinal contours are stable. Central pulmonary vascular congestion. Question  mild interstitial prominence. No lobar consolidation. Subtle basilar airspace disease. Lung volumes are similar to the prior study and diminished/low normal. EKG leads project over the chest. On limited assessment no acute skeletal process. IMPRESSION: Central pulmonary vascular congestion with possible mild interstitial edema. Subtle basilar airspace disease, likely atelectasis. Electronically Signed   By: Donzetta Kohut M.D.   On: 05/18/2021 17:13    Procedures .Critical Care Performed by: Mannie Stabile, PA-C Authorized by: Mannie Stabile, PA-C   Critical care provider statement:    Critical care time (minutes):  45   Critical care was necessary to treat or prevent imminent or life-threatening deterioration of the following conditions:  Respiratory failure   Critical care was time spent personally by me on the following activities:  Discussions with consultants, evaluation of patient's response to treatment, examination of patient, ordering and performing treatments and interventions, ordering and review of laboratory studies, ordering and review of radiographic studies, pulse oximetry, re-evaluation of patient's condition, obtaining history from patient or surrogate and review of old charts   I assumed direction of critical care for this patient from another provider in my specialty: no     Care discussed with: admitting provider       Medications Ordered in ED Medications  albuterol (PROVENTIL,VENTOLIN) solution continuous neb (0 mg/hr Nebulization Stopped 05/18/21 1732)  nitroGLYCERIN 50 mg in dextrose 5 % 250 mL (0.2 mg/mL) infusion (40 mcg/min Intravenous New Bag/Given 05/18/21 1734)  methylPREDNISolone sodium succinate (SOLU-MEDROL) 125 mg/2 mL injection 125 mg (125 mg Intravenous Given 05/18/21 1636)  ipratropium (ATROVENT) nebulizer solution 0.5 mg (0.5 mg Nebulization Given 05/18/21 1636)  furosemide (LASIX) injection 40 mg (40 mg Intravenous Given 05/18/21 1636)    ED  Course  I have reviewed the triage vital signs and the nursing notes.  Pertinent labs & imaging results that were available during my care of the patient were reviewed by me and considered in  my medical decision making (see chart for details).  Clinical Course as of 05/18/21 1744  Sat May 18, 2021  1720 WBC(!): 25.4 [CA]  1723 pCO2, Ven(!!): 104 [CA]  1723 pH, Ven(!): 7.221 [CA]  1723 Bicarbonate(!): 41.0 [CA]  1723 Glucose(!): 209 [CA]    Clinical Course User Index [CA] Mannie Stabile, PA-C   MDM Rules/Calculators/A&P                         50 year old male with a history of asthma and diastolic heart failure who presents to the ED in respiratory distress.  Patient's O2 saturation in the 40s on room air upon arrival. Patient endorses worsening shortness of breath over the past week associated with bilateral lower extremity edema. No associated chest pain.  Patient placed on 15 L nonrebreather with O2 saturation in the 90s.  Patient lethargic during initial evaluation however, oriented to self, time, and place.  Patient started on continuous nebulizer with vent.  IV Lasix and Solu-Medrol given.  Routine labs, VBG, troponin, EKG ordered.  Chest x-ray to rule out pneumonia.  Suspect symptoms multifactorial with asthma and CHF exacerbations.  Discussed case with Dr. Madilyn Hook who evaluated patient during initial evaluation and agrees with assessment and plan.  BNP elevated at 207.  Troponin elevated at 39.  VBG significant for acidosis at 7.2 with CO2 at 104. Bicarb 41.  Significant for leukocytosis at 25.  CMP significant for hyperglycemia 206.  Elevated AST of 53 and ALT at 80. CXR personally reviewed which demonstrates: IMPRESSION:  Central pulmonary vascular congestion with possible mild  interstitial edema. Subtle basilar airspace disease, likely  atelectasis.   EKG personally reviewed which demonstrates sinus tachycardia with ST-T wave abnormalities. Nitroglycerin drip started due to  continued worsening shortness of breath.   Discussed case with Nehemiah Settle with ICU who agrees to admit patient for further treatment. Final Clinical Impression(s) / ED Diagnoses Final diagnoses:  Acute respiratory failure with hypoxia [facility])    Rx / DC Orders ED Discharge Orders    None       Jesusita Oka 05/18/21 2032    Tilden Fossa, MD 05/19/21 1240

## 2021-05-18 NOTE — ED Notes (Signed)
Pt attempted to get out of bed to "use the bathroom". This RN reminded pt that he it is not safe for him to get out of bed at this time. Pt also reminded that male purewick is in place for urination needs. Pt amenable to this plan at this time

## 2021-05-18 NOTE — Consult Note (Signed)
NAME:  Joseph Morales, MRN:  [id number], DOB:  Jul 08, 1971, LOS: 0 ADMISSION DATE:  05/18/2021, CONSULTATION DATE: 05/18/2021 REFERRING MD: ED doc, CHIEF COMPLAINT: Hypercapnic respiratory failure  History of Present Illness:  Patient came into the hospital with worsening shortness of breath, has been struggling for couple weeks Claims compliance usually with medications but sometimes does forget Has not had any fevers or chills No increased cough or chest pain or discomfort Worsening shortness of breath, leg edema Has had multiple admissions in the past for decompensated diastolic failure, patient states symptoms feel like when he has had decompensation in the past  Pertinent  Medical History   Past Medical History:  Diagnosis Date  . Asthma   . CHF (congestive heart failure) (HCC)   . Obesity    Significant Hospital Events: Including procedures, antibiotic start and stop dates in addition to other pertinent events   . On BiPAP  Interim History / Subjective:  Arousable, able to hold a conversation Drifts off to sleep if he is not being interacted with  Objective   Blood pressure (!) 143/62, pulse (!) 109, temperature 98.6 F (37 C), temperature source Oral, resp. rate (!) 28, weight 122.5 kg, SpO2 92 %.    FiO2 (%):  [30 %] 30 %   Intake/Output Summary (Last 24 hours) at 05/18/2021 1837 Last data filed at 05/18/2021 1735 Gross per 24 hour  Intake --  Output 900 ml  Net -900 ml   Filed Weights   05/18/21 1735  Weight: 122.5 kg    Examination: General: Middle-age gentleman, does not appear to be in distress HENT: BiPAP mask in place Lungs: Decreased air movement bilaterally Cardiovascular: S1-S2 appreciated Abdomen: Obese Extremities: Peripheral edema Neuro: Arousable but sleepy GU:   Labs/imaging that I havepersonally reviewed  (right click and "Reselect all SmartList Selections" daily)  Chest x-ray significant for pulmonary vascular congestion-reviewed by  myself Leukocytosis   Resolved Hospital Problem list     Assessment & Plan:  Hypercapnic respiratory failure -Continue BiPAP -Trend venous blood gas -If mental status continues to improve no escalation needed however if mental status is not improving may need intubated  Decompensated diastolic heart failure -Diuresis as tolerated -Usually on Lasix 80 mg in the morning and 40 mg at night -lasix iv 40q12  Uncontrolled hypertension -Was started on nitro -We will wean as tolerated  Asthma with exacerbation -Continue bronchodilators -Steroids -Wheezing bronchospasm seems to be improving since nebulization treatments  Morbid obesity/obesity hypoventilation -Further evaluation when more stable  Leukocytosis concerning for underlying infection -Will start coverage for possible community-acquired pneumonia    Best practice (right click and "Reselect all SmartList Selections" daily)  Diet:  NPO Pain/Anxiety/Delirium protocol (if indicated): No VAP protocol (if indicated): Not indicated  DVT prophylaxis: LMWH GI prophylaxis: N/A Glucose control:  SSI No Central venous access:  N/A Arterial line:  N/A Foley:  N/A Mobility:  bed rest  PT consulted: N/A Last date of multidisciplinary goals of care discussion [pending] Code Status:  full code Disposition: ICU Labs   CBC: Recent Labs  Lab 05/18/21 1628 05/18/21 1647  WBC 25.4*  --   NEUTROABS 20.0*  --   HGB 14.5 17.0  HCT 49.2 50.0  MCV 99.6  --   PLT 247  --     Basic Metabolic Panel: Recent Labs  Lab 05/18/21 1628 05/18/21 1647  NA 138 137  K 4.7 4.6  CL 94* 91*  CO2 38*  --   GLUCOSE 206* 209*  BUN 26* 27*  CREATININE 1.15 1.20  CALCIUM 8.5*  --    GFR: Estimated Creatinine Clearance: 107.3 mL/min (by C-G formula based on SCr of 1.2 mg/dL). Recent Labs  Lab 05/18/21 1628  WBC 25.4*    Liver Function Tests: Recent Labs  Lab 05/18/21 1628  AST 53*  ALT 80*  ALKPHOS 64  BILITOT 1.8*  PROT  7.6  ALBUMIN 3.6   No results for input(s): LIPASE, AMYLASE in the last 168 hours. No results for input(s): AMMONIA in the last 168 hours.  ABG    Component Value Date/Time   PHART 7.158 (LL) 10/17/2020 1327   PCO2ART >120 (HH) 10/17/2020 1327   PO2ART 89.8 10/17/2020 1327   HCO3 41.0 (H) 05/18/2021 1635   TCO2 39 (H) 05/18/2021 1647   O2SAT 95.4 05/18/2021 1635     Coagulation Profile: No results for input(s): INR, PROTIME in the last 168 hours.  Cardiac Enzymes: No results for input(s): CKTOTAL, CKMB, CKMBINDEX, TROPONINI in the last 168 hours.  HbA1C: Hgb A1c MFr Bld  Date/Time Value Ref Range Status  06/23/2020 05:02 AM 5.9 (H) 4.8 - 5.6 % Final    Comment:    (NOTE) Pre diabetes:          5.7%-6.4%  Diabetes:              >6.4%  Glycemic control for   <7.0% adults with diabetes     CBG: No results for input(s): GLUCAP in the last 168 hours.  Review of Systems:   Denies any pain or discomfort  Past Medical History:  He,  has a past medical history of Asthma, CHF (congestive heart failure) (HCC), and Obesity.   Surgical History:   Past Surgical History:  Procedure Laterality Date  . NO PAST SURGERIES       Social History:   reports that he has been smoking cigarettes. He has a 20.00 pack-year smoking history. He has never used smokeless tobacco. He reports current drug use. Drug: Marijuana. He reports that he does not drink alcohol.   Family History:  His family history includes Breast cancer in his mother; Congestive Heart Failure in his maternal grandmother; Diabetes in his mother; Emphysema in his maternal grandmother.   Allergies No Known Allergies   Home Medications  Prior to Admission medications   Medication Sig Start Date End Date Taking? Authorizing Provider  albuterol (VENTOLIN HFA) 108 (90 Base) MCG/ACT inhaler Inhale 2 puffs into the lungs every 6 (six) hours as needed for wheezing or shortness of breath. 08/01/20   Anders Simmonds,  PA-C  budesonide-formoterol (SYMBICORT) 160-4.5 MCG/ACT inhaler INHALE 2 PUFFS INTO THE LUNGS IN THE MORNING AND AT BEDTIME. 02/22/21 02/22/22  Oretha Milch, MD  furosemide (LASIX) 40 MG tablet TAKE 80 MG (2 TABLETS) IN THE MORNING 40MG  (ONE TABLET) IN THE AFTERNOON 01/01/21 01/01/22  03/01/22, MD  mometasone-formoterol (DULERA) 100-5 MCG/ACT AERO INHALE 2 PUFFS INTO THE LUNGS 2 (TWO) TIMES DAILY. 01/01/21 01/01/22  03/01/22, MD  carvedilol (COREG) 6.25 MG tablet Take 1 tablet (6.25 mg total) by mouth 2 (two) times daily. Patient not taking: Reported on 02/22/2021 10/25/20 02/22/21  02/24/21, MD  losartan (COZAAR) 50 MG tablet Take 1 tablet (50 mg total) by mouth daily. Patient not taking: Reported on 02/22/2021 10/25/20 02/22/21  02/24/21, MD    The patient is critically ill with multiple organ systems failure and requires high complexity decision making for assessment and support, frequent  evaluation and titration of therapies, application of advanced monitoring technologies and extensive interpretation of multiple databases. Critical Care Time devoted to patient care services described in this note independent of APP/resident time (if applicable)  is 32 minutes.   Virl Diamond MD [facility] Personal pager: [phone] If unanswered, please page CCM On-call: #[phone]

## 2021-05-19 DIAGNOSIS — J9622 Acute and chronic respiratory failure with hypercapnia: Secondary | ICD-10-CM | POA: Diagnosis not present

## 2021-05-19 LAB — CBC
HCT: 51.3 % (ref 39.0–52.0)
Hemoglobin: 15 g/dL (ref 13.0–17.0)
MCH: 29.4 pg (ref 26.0–34.0)
MCHC: 29.2 g/dL — ABNORMAL LOW (ref 30.0–36.0)
MCV: 100.4 fL — ABNORMAL HIGH (ref 80.0–100.0)
Platelets: 231 10*3/uL (ref 150–400)
RBC: 5.11 MIL/uL (ref 4.22–5.81)
RDW: 14.5 % (ref 11.5–15.5)
WBC: 22.3 10*3/uL — ABNORMAL HIGH (ref 4.0–10.5)
nRBC: 0.7 % — ABNORMAL HIGH (ref 0.0–0.2)

## 2021-05-19 LAB — BASIC METABOLIC PANEL
Anion gap: 10 (ref 5–15)
BUN: 29 mg/dL — ABNORMAL HIGH (ref 6–20)
CO2: 42 mmol/L — ABNORMAL HIGH (ref 22–32)
Calcium: 8.9 mg/dL (ref 8.9–10.3)
Chloride: 88 mmol/L — ABNORMAL LOW (ref 98–111)
Creatinine, Ser: 1.1 mg/dL (ref 0.61–1.24)
GFR, Estimated: >60 mL/min (ref >60)
Glucose, Bld: 147 mg/dL — ABNORMAL HIGH (ref 70–99)
Potassium: 5.1 mmol/L (ref 3.5–5.1)
Sodium: 140 mmol/L (ref 135–145)

## 2021-05-19 LAB — GLUCOSE, CAPILLARY
Glucose-Capillary: 205 mg/dL — ABNORMAL HIGH (ref 70–99)
Glucose-Capillary: 145 mg/dL — ABNORMAL HIGH (ref 70–99)
Glucose-Capillary: 209 mg/dL — ABNORMAL HIGH (ref 70–99)
Glucose-Capillary: 164 mg/dL — ABNORMAL HIGH (ref 70–99)

## 2021-05-19 LAB — MAGNESIUM: Magnesium: 2.5 mg/dL — ABNORMAL HIGH (ref 1.7–2.4)

## 2021-05-19 LAB — BLOOD GAS, ARTERIAL
Acid-Base Excess: 10.1 mmol/L — ABNORMAL HIGH (ref 0.0–2.0)
Bicarbonate: 39.7 mmol/L — ABNORMAL HIGH (ref 20.0–28.0)
FIO2: 35
O2 Saturation: 88.4 %
Patient temperature: 98.6
pCO2 arterial: 80.7 mmHg (ref 32.0–48.0)
pH, Arterial: 7.313 — ABNORMAL LOW (ref 7.350–7.450)
pO2, Arterial: 61.8 mmHg — ABNORMAL LOW (ref 83.0–108.0)

## 2021-05-19 LAB — PHOSPHORUS: Phosphorus: 4.3 mg/dL (ref 2.5–4.6)

## 2021-05-19 LAB — HEMOGLOBIN A1C
Hgb A1c MFr Bld: 6.6 % — ABNORMAL HIGH (ref 4.8–5.6)
Mean Plasma Glucose: 142.72 mg/dL

## 2021-05-19 LAB — MRSA PCR SCREENING: MRSA by PCR: NEGATIVE

## 2021-05-19 MED ORDER — ENOXAPARIN SODIUM 80 MG/0.8ML IJ SOSY
80.0000 mg | PREFILLED_SYRINGE | INTRAMUSCULAR | Status: DC
  Administered 2021-05-19 – 2021-05-20 (×2): 80 mg via SUBCUTANEOUS
  Filled 2021-05-19 (×2): qty 0.8

## 2021-05-19 MED ORDER — INSULIN ASPART 100 UNIT/ML IJ SOLN
0.0000 [IU] | INTRAMUSCULAR | Status: DC
  Administered 2021-05-19: 7 [IU] via SUBCUTANEOUS
  Administered 2021-05-19: 4 [IU] via SUBCUTANEOUS
  Administered 2021-05-19: 7 [IU] via SUBCUTANEOUS
  Administered 2021-05-19: 3 [IU] via SUBCUTANEOUS
  Administered 2021-05-20: 4 [IU] via SUBCUTANEOUS
  Administered 2021-05-20 (×2): 3 [IU] via SUBCUTANEOUS
  Administered 2021-05-20: 4 [IU] via SUBCUTANEOUS
  Administered 2021-05-21: 3 [IU] via SUBCUTANEOUS
  Administered 2021-05-21: 4 [IU] via SUBCUTANEOUS

## 2021-05-19 NOTE — Progress Notes (Signed)
NAME:  Joseph Morales, MRN:  [id number], DOB:  1971-02-27, LOS: 1 ADMISSION DATE:  05/18/2021, CONSULTATION DATE: 05/18/2021 REFERRING MD: ED doc, CHIEF COMPLAINT: Hypercapnic respiratory failure  History of Present Illness:  Patient came into the hospital with worsening shortness of breath, has been struggling for couple weeks Claims compliance usually with medications but sometimes does forget Has not had any fevers or chills No increased cough or chest pain or discomfort Worsening shortness of breath, leg edema Has had multiple admissions in the past for decompensated diastolic failure, patient states symptoms feel like when he has had decompensation in the past  Pertinent  Medical History   Past Medical History:  Diagnosis Date  . Asthma   . CHF (congestive heart failure) (HCC)   . Obesity    Significant Hospital Events: Including procedures, antibiotic start and stop dates in addition to other pertinent events   . Was on BiPAP through the night  Interim History / Subjective:  Awake alert interactive Feels much better Attempting to wean off BiPAP  Objective   Blood pressure (!) 120/59, pulse 88, temperature 97.6 F (36.4 C), temperature source Axillary, resp. rate (!) 24, weight (!) 173.1 kg, SpO2 93 %.    FiO2 (%):  [30 %-35 %] 35 %   Intake/Output Summary (Last 24 hours) at 05/19/2021 0936 Last data filed at 05/19/2021 0815 Gross per 24 hour  Intake 473.54 ml  Output 1900 ml  Net -1426.46 ml   Filed Weights   05/18/21 1735 05/19/21 0500  Weight: 122.5 kg (!) 173.1 kg    Examination: General: Middle-aged, morbidly obese, does not appear to be in distress HENT: BiPAP in place Lungs: Decreased air movement bilaterally Cardiovascular: S1-S2 appreciated Abdomen: Obese Extremities: Peripheral edema Neuro: Awake alert and interactive GU: Fair output  Labs/imaging that I havepersonally reviewed  (right click and "Reselect all SmartList Selections" daily)  Chest  x-ray with hypoventilated lung fields ABG with hypercapnia but significantly improved Leukocytosis Resolved Hospital Problem list     Assessment & Plan:  Marland Kitchen  Hypercapnic respiratory failure -We will transition to nasal cannula this morning -BiPAP at night -Mental status is improving -ABG improving  Decompensated diastolic heart failure -Diuresis as tolerated -Continue Lasix 40 every 12  Uncontrolled hypertension -We will wean off nitro  Asthma exacerbation -Continue bronchodilators -Continue steroids  Morbid obesity/hypoventilation -We will need sleep study as outpatient  Leukocytosis concerning for underlying infection -Started on coverage for possible community-acquired pneumonia  Best practice (right click and "Reselect all SmartList Selections" daily)  Diet:  Oral Pain/Anxiety/Delirium protocol (if indicated): No VAP protocol (if indicated): Not indicated DVT prophylaxis: LMWH GI prophylaxis: N/A Glucose control:  SSI Yes Central venous access:  N/A Arterial line:  N/A Foley:  N/A Mobility:  bed rest  PT consulted: N/A Last date of multidisciplinary goals of care discussion [pending] Code Status:  full code Disposition: icu  Labs   CBC: Recent Labs  Lab 05/18/21 1628 05/18/21 1647 05/19/21 0257  WBC 25.4*  --  22.3*  NEUTROABS 20.0*  --   --   HGB 14.5 17.0 15.0  HCT 49.2 50.0 51.3  MCV 99.6  --  100.4*  PLT 247  --  231    Basic Metabolic Panel: Recent Labs  Lab 05/18/21 1628 05/18/21 1647  NA 138 137  K 4.7 4.6  CL 94* 91*  CO2 38*  --   GLUCOSE 206* 209*  BUN 26* 27*  CREATININE 1.15 1.20  CALCIUM 8.5*  --  GFR: Estimated Creatinine Clearance: 129.1 mL/min (by C-G formula based on SCr of 1.2 mg/dL). Recent Labs  Lab 05/18/21 1628 05/19/21 0257  WBC 25.4* 22.3*    Liver Function Tests: Recent Labs  Lab 05/18/21 1628  AST 53*  ALT 80*  ALKPHOS 64  BILITOT 1.8*  PROT 7.6  ALBUMIN 3.6   No results for input(s): LIPASE,  AMYLASE in the last 168 hours. No results for input(s): AMMONIA in the last 168 hours.  ABG    Component Value Date/Time   PHART 7.313 (L) 05/19/2021 0128   PCO2ART 80.7 (HH) 05/19/2021 0128   PO2ART 61.8 (L) 05/19/2021 0128   HCO3 39.7 (H) 05/19/2021 0128   TCO2 39 (H) 05/18/2021 1647   O2SAT 88.4 05/19/2021 0128     Coagulation Profile: No results for input(s): INR, PROTIME in the last 168 hours.  Cardiac Enzymes: No results for input(s): CKTOTAL, CKMB, CKMBINDEX, TROPONINI in the last 168 hours.  HbA1C: Hgb A1c MFr Bld  Date/Time Value Ref Range Status  06/23/2020 05:02 AM 5.9 (H) 4.8 - 5.6 % Final    Comment:    (NOTE) Pre diabetes:          5.7%-6.4%  Diabetes:              >6.4%  Glycemic control for   <7.0% adults with diabetes     CBG: No results for input(s): GLUCAP in the last 168 hours.  Review of Systems:   Awake and interactive  Past Medical History:  He,  has a past medical history of Asthma, CHF (congestive heart failure) (HCC), and Obesity.   Surgical History:   Past Surgical History:  Procedure Laterality Date  . NO PAST SURGERIES       Social History:   reports that he has been smoking cigarettes. He has a 20.00 pack-year smoking history. He has never used smokeless tobacco. He reports current drug use. Drug: Marijuana. He reports that he does not drink alcohol.   Family History:  His family history includes Breast cancer in his mother; Congestive Heart Failure in his maternal grandmother; Diabetes in his mother; Emphysema in his maternal grandmother.   Allergies No Known Allergies   The patient is critically ill with multiple organ systems failure and requires high complexity decision making for assessment and support, frequent evaluation and titration of therapies, application of advanced monitoring technologies and extensive interpretation of multiple databases. Critical Care Time devoted to patient care services described in this note  independent of APP/resident time (if applicable)  is 32 minutes.   Virl Diamond MD [facility] Personal pager: [phone] If unanswered, please page CCM On-call: [phone]

## 2021-05-20 DIAGNOSIS — R739 Hyperglycemia, unspecified: Secondary | ICD-10-CM

## 2021-05-20 DIAGNOSIS — J441 Chronic obstructive pulmonary disease with (acute) exacerbation: Secondary | ICD-10-CM

## 2021-05-20 DIAGNOSIS — E662 Morbid (severe) obesity with alveolar hypoventilation: Secondary | ICD-10-CM

## 2021-05-20 DIAGNOSIS — J9621 Acute and chronic respiratory failure with hypoxia: Secondary | ICD-10-CM

## 2021-05-20 DIAGNOSIS — G4733 Obstructive sleep apnea (adult) (pediatric): Secondary | ICD-10-CM

## 2021-05-20 LAB — GLUCOSE, CAPILLARY
Glucose-Capillary: 111 mg/dL — ABNORMAL HIGH (ref 70–99)
Glucose-Capillary: 130 mg/dL — ABNORMAL HIGH (ref 70–99)
Glucose-Capillary: 195 mg/dL — ABNORMAL HIGH (ref 70–99)
Glucose-Capillary: 126 mg/dL — ABNORMAL HIGH (ref 70–99)
Glucose-Capillary: 184 mg/dL — ABNORMAL HIGH (ref 70–99)

## 2021-05-20 MED ORDER — BUDESONIDE 0.5 MG/2ML IN SUSP
0.5000 mg | Freq: Two times a day (BID) | RESPIRATORY_TRACT | Status: DC
  Administered 2021-05-20 – 2021-05-21 (×2): 0.5 mg via RESPIRATORY_TRACT
  Filled 2021-05-20 (×2): qty 2

## 2021-05-20 MED ORDER — PREDNISONE 20 MG PO TABS
40.0000 mg | ORAL_TABLET | Freq: Every day | ORAL | Status: DC
  Administered 2021-05-21: 40 mg via ORAL
  Filled 2021-05-20: qty 2

## 2021-05-20 NOTE — H&P (Signed)
Please see consult note from 5/21 at 6:37 pm as H&P

## 2021-05-20 NOTE — Plan of Care (Signed)
Discussed with patient plan of care for the evening, pain management and need for a primary MD with some teach back displayed.  Patient didn't understand when his legs are swollen he could call his primary MD and take medication at home instead of waiting til its to late and a hospital stay.  Problem: Education: Goal: Knowledge of General Education information will improve Description: Including pain rating scale, medication(s)/side effects and non-pharmacologic comfort measures Outcome: Progressing

## 2021-05-20 NOTE — Progress Notes (Signed)
SATURATION QUALIFICATIONS: (This note is used to comply with regulatory documentation for home oxygen)  Patient Saturations on Room Air at Rest = 84%  Patient Saturations on Room Air while Ambulating = N/A  Patient Saturations on 2L Liters of oxygen while Ambulating = 81%  Pt prior to admission oxygen need was 2L/min.  Ambulated 10 steps in room at 2L, desat to 81%.  Nasal Cannula O2 increased to 6L and pt sat on bed to recover.  4 minutes to recover to >92%.  Pt returned to lying in bed, O2 >92% with this movement.  Nasal cannula returned to 3L to maintain O2 saturations.  MD made aware.  RN will continue to carefully monitor.

## 2021-05-20 NOTE — Discharge Summary (Signed)
Physician Discharge Summary         Patient ID: Joseph Morales MRN: [id number] DOB/AGE: 03-07-1971 50 y.o.  Admit date: 05/18/2021 Discharge date: 05/21/2021  Discharge Diagnoses:    Acute on chronic hypercapnic respiratory failure Morbid obesity/hypoventilation  Class # morbid obesity w/ BMI 40-49.9 Asthma exacerbation Acute on chronic diastolic heart failure HTN Hyperglycemia Leukocytosis Chronic venous insufficiency  Discharge summary    Patient is a 50 yo male with pertinent past medical history for chronic hypercapnia, chronic diastolic chf (EF 79-15%), asthma, HTN, HLD, tobacco use, BMI 47, and suspected OHS admitted to [facility] on 05/18/2021 for acute on chronic hypercapnic respiratory failure and worsening shortness of breath.   Upon admission in ED on 05/18/2021, patient's  O2 sats were 40s on RA. Patient has been having increasing SOB and LE edema in the past few weeks. He takes 40 Lasix at home but not compliant with it. Patient also has had a sleep in March 2022 and was supposed to pick up his BiPAP machine for home use and forgot his appointment. Patient wears 2 liters nasal cannula at night currently. Has had multiple admission in the past for decompensated diastolic heart failure.   Hospital Course 5/21: Patient admitted to [facility]; placed on BiPAP; ABG respiratory acidosis  5/22: ABG improving. Weaning to BiPAP QHS and prn while napping. Supplemental O2 during the day. As of 5/23: Patient only wearing BiPAP at night and prn; Supplemental O2 during the day; Receiving breathing treatment; feels like his breathing is at baseline. We had planned on sending him home but walking oximetry demonstrated that his O2 sats dropped down to 81% on 2 liters after just ambulating 10 steps. Because of this his discharge was canceled and he was given additional diuretics.  5/24 as of morning rounds I&O balance -5.6 liters. Resting and walking oximetry obtained and were as follows: dropped to 85%  rebounded w/ 4 liters.  He will be discharged to home with the plan of care as outlined below.    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 Vassie Loll, suspect that his oxygen needs have actually been a bit higher than he has been using at home. He typically just leaves it on 2 liters. He has BIPAP waiting for him. Just needs to get it  Plan: Dc on home oxygen 4lpm at all times.  Patient will pick up his BIPAP device current plan is settings BiPAP therapy on 25/21 cm H2O with a Large size Resmed Full Face Mask AirFit F20 mask and heated humidification. - 3L O2 should be blended in Home on 80mg  lasix daily in morning and 40 in afternoon. Daily weight w/ extra dose of 80 mg for 2 lb weight gain in 24 hours  F/u in our clinic w/in 1 week (6/2 at 2pm)  Decompensated diastolic heart failure with EF 75-80% Uncontrolled hypertension Plan: Home on coreg furosemide and Cozaar   Asthma exacerbation Plan Dc'd steroids Home on spiriva and symbicort w/ PRN SABA (has this at home) no need for further abx (we think that this was more HF than asthma)  Leukocytosis: initially reactive. Then exacerbated by steroids Plan Stopping steroids Stopping abx    Hyperglycemia: due to  steroids Plan Dc steroids  Culture data/antimicrobials   Covid, flu, MRSA negative   Consults  Critical care  Discharge Exam: Blood Pressure 130/70   Pulse 84   Temperature 98.1 F (36.7 C) (  Oral)   Respiration (Abnormal) 27   Weight (Abnormal) 172.8 kg   Oxygen Saturation 90%   Body Mass Index 47.62 kg/m   General 50 year old aam resting in bed. No distress  HENT NCAT no JVD. MMM pulm clear. Dec bases. No accessory use  Card RRR w/out MRG abd obese. Not tender + bowel sounds Ext + LE edema brisk CR Neuro intact  Labs at discharge   Lab Results  Component Value Date   CREATININE 1.02  05/21/2021   BUN 30 (H) 05/21/2021   NA 137 05/21/2021   K 4.3 05/21/2021   CL 87 (L) 05/21/2021   CO2 44 (H) 05/21/2021   Lab Results  Component Value Date   WBC 22.7 (H) 05/21/2021   HGB 15.0 05/21/2021   HCT 50.9 05/21/2021   MCV 99.4 05/21/2021   PLT 290 05/21/2021   Lab Results  Component Value Date   ALT 80 (H) 05/18/2021   AST 53 (H) 05/18/2021   ALKPHOS 64 05/18/2021   BILITOT 1.8 (H) 05/18/2021   No results found for: INR, PROTIME  Current radiological studies    No results found.  Disposition:    Discharge disposition: 01-Home or Self Care       Discharge Instructions    Diet - low sodium heart healthy   Complete by: As directed    Discharge instructions   Complete by: As directed    Be sure to pick up your BIPAP ASAP! Take all your medicines as directed Be sure to call your primary doctor. You need to be seen in the next week for lab work Wear your oxygen as directed at all times no exceptions.   Increase activity slowly   Complete by: As directed       Allergies as of 05/21/2021   No Known Allergies     Medication List    Stop taking these medications   Dulera 100-5 MCG/ACT Aero Generic drug: mometasone-formoterol     Take these medications   albuterol 108 (90 Base) MCG/ACT inhaler Commonly known as: VENTOLIN HFA Inhale 2 puffs into the lungs every 6 (six) hours as needed for wheezing or shortness of breath.   carvedilol 6.25 MG tablet Commonly known as: COREG Take 6.25 mg by mouth 2 (two) times daily.   furosemide 80 MG tablet Commonly known as: Lasix Take 1 tablet (80 mg total) by mouth 2 (two) times daily. What changed:   medication strength  how much to take  how to take this  when to take this   losartan 50 MG tablet Commonly known as: COZAAR Take 50 mg by mouth daily.   OXYGEN Inhale into the lungs at bedtime.   potassium chloride SA 20 MEQ tablet Commonly known as: KLOR-CON Take 1 tablet (20 mEq total) by  mouth daily.   Spiriva Respimat 1.25 MCG/ACT Aers Generic drug: Tiotropium Bromide Monohydrate Inhale 1 puff into the lungs daily. What changed:   when to take this  reasons to take this   Symbicort 160-4.5 MCG/ACT inhaler Generic drug: budesonide-formoterol INHALE 2 PUFFS INTO THE LUNGS IN THE MORNING AND AT BEDTIME.        Durable Medical Equipment  (From admission, onward)         Start     Ordered   05/21/21 1003  DME Oxygen  Once       Question Answer Comment  Length of Need Lifetime   Liters per Minute 3   Oxygen delivery system Gas  05/21/21 1005           Follow-up appointment   June 2 at 2 pm  Discharge Condition:    good  Physician Statement:   The Patient was personally examined, the discharge assessment and plan has been personally reviewed and I agree with assessment and plan. 35 minutes of time have been dedicated to discharge assessment, planning and discharge instructions.   Signed: Shelby Mattocks 05/21/2021, 10:57 AM

## 2021-05-20 NOTE — Progress Notes (Addendum)
NAME:  Joseph Morales, MRN:  [id number], DOB:  09/03/1971, LOS: 2 ADMISSION DATE:  05/18/2021, CONSULTATION DATE: 05/18/2021 REFERRING MD: ED doc, CHIEF COMPLAINT: Hypercapnic respiratory failure  History of Present Illness:  Patient came into the hospital with worsening shortness of breath, has been struggling for couple weeks. Patient states that he was unable to get his BiPAP for home use earlier in May 2022 because he missed his appointment to pick it up. He still smokes and has smoked a pack a day for the past 17 years. He also wears O2 at home at night. Claims compliance usually with medications but sometimes does forget. Has not had any fevers or chills No increased cough or chest pain or discomfort Worsening shortness of breath, leg edema Has had multiple admissions in the past for decompensated diastolic failure, patient states symptoms feel like when he has had decompensation in the past  Pertinent  Medical History   Past Medical History:  Diagnosis Date  . Asthma   . CHF (congestive heart failure) (HCC)   . Obesity    Significant Hospital Events: Including procedures, antibiotic start and stop dates in addition to other pertinent events   . 5/21: Was on BiPAP through the night  Interim History / Subjective:   Patient feels that his breathing is continuing to improve. Still wearing BiPAP at night and prn during the day Tmax 98.1; WBC 22.3  Objective   Blood pressure (!) 114/45, pulse 99, temperature 98.1 F (36.7 C), temperature source Oral, resp. rate 16, weight (!) 173.1 kg, SpO2 93 %.    FiO2 (%):  [35 %] 35 %   Intake/Output Summary (Last 24 hours) at 05/20/2021 0825 Last data filed at 05/20/2021 0600 Gross per 24 hour  Intake 2874.4 ml  Output 3375 ml  Net -500.6 ml   Filed Weights   05/18/21 1735 05/19/21 0500 05/20/21 0500  Weight: 122.5 kg (!) 173.1 kg (!) 173.1 kg    Examination: General:  NAD; BMI 47.7 HEENT: MM pink/moist; [place] in place Neuro:  AOX4 CV: s1s2, no m/r/g PULM:  Ex Wheezing bilaterally; no accessory muscle use GI: soft, bsx4 active Extremities: warm/dry, trace edema upper and lower extremities Skin: no rashes or lesions  Labs/imaging that I havepersonally reviewed  (right click and "Reselect all SmartList Selections" daily)   HCO3 42 (baseline), WBC 22.3 from 25.4, A1C 6.6 ABG 5/22: improving acute respiratory acidosis on chronic  Resolved Hospital Problem list     Assessment & Plan:  Acute on Chronic Hypercapnic respiratory failure Morbid obesity/hypoventilation Patient of Dr. Vassie Loll Plan: - Continue BiPAP QHS and prn for naps - Nasal Cannula during day for sats >90% - Need to ensure patient has BiPAP at home at discharge and f/u with pulmonary outpt -Will transfer to progressive  Decompensated diastolic heart failure with EF 75-80% Uncontrolled hypertension Plan: -Continue Lasix 40 Q12 - Off Nitro  Asthma exacerbation Take Symbicort and Spiriva at home Plan: -Continue scheduled DuoNeb -Continue steroids -Will start Pulmicort BID  Leukocytosis: concerning for underlying infection; was high before steroids started Plan: -Continue Azithromycin and Ceftriaxone for prophylactic coverage of CAP -WBC trending down; continue to trend CBC  Hyperglycemia Patient on Steroids Plan: -Continue SSI and CBG monitoring  Best practice (right click and "Reselect all SmartList Selections" daily)  Diet:  Oral Pain/Anxiety/Delirium protocol (if indicated): No VAP protocol (if indicated): Not indicated DVT prophylaxis: LMWH GI prophylaxis: N/A Glucose control:  SSI Yes Central venous access:  N/A Arterial line:  N/A Foley:  N/A Mobility:  OOB  PT consulted: Yes Last date of multidisciplinary goals of care discussion [pending] Code Status:  full code Disposition: icu  Labs   CBC: Recent Labs  Lab 05/18/21 1628 05/18/21 1647 05/19/21 0257  WBC 25.4*  --  22.3*  NEUTROABS 20.0*  --   --   HGB  14.5 17.0 15.0  HCT 49.2 50.0 51.3  MCV 99.6  --  100.4*  PLT 247  --  231    Basic Metabolic Panel: Recent Labs  Lab 05/18/21 1628 05/18/21 1647 05/19/21 0838  NA 138 137 140  K 4.7 4.6 5.1  CL 94* 91* 88*  CO2 38*  --  42*  GLUCOSE 206* 209* 147*  BUN 26* 27* 29*  CREATININE 1.15 1.20 1.10  CALCIUM 8.5*  --  8.9  MG  --   --  2.5*  PHOS  --   --  4.3   GFR: Estimated Creatinine Clearance: 140.8 mL/min (by C-G formula based on SCr of 1.1 mg/dL). Recent Labs  Lab 05/18/21 1628 05/19/21 0257  WBC 25.4* 22.3*    Liver Function Tests: Recent Labs  Lab 05/18/21 1628  AST 53*  ALT 80*  ALKPHOS 64  BILITOT 1.8*  PROT 7.6  ALBUMIN 3.6   No results for input(s): LIPASE, AMYLASE in the last 168 hours. No results for input(s): AMMONIA in the last 168 hours.  ABG    Component Value Date/Time   PHART 7.313 (L) 05/19/2021 0128   PCO2ART 80.7 (HH) 05/19/2021 0128   PO2ART 61.8 (L) 05/19/2021 0128   HCO3 39.7 (H) 05/19/2021 0128   TCO2 39 (H) 05/18/2021 1647   O2SAT 88.4 05/19/2021 0128     Coagulation Profile: No results for input(s): INR, PROTIME in the last 168 hours.  Cardiac Enzymes: No results for input(s): CKTOTAL, CKMB, CKMBINDEX, TROPONINI in the last 168 hours.  HbA1C: Hgb A1c MFr Bld  Date/Time Value Ref Range Status  05/19/2021 11:16 AM 6.6 (H) 4.8 - 5.6 % Final    Comment:    (NOTE) Pre diabetes:          5.7%-6.4%  Diabetes:              >6.4%  Glycemic control for   <7.0% adults with diabetes   06/23/2020 05:02 AM 5.9 (H) 4.8 - 5.6 % Final    Comment:    (NOTE) Pre diabetes:          5.7%-6.4%  Diabetes:              >6.4%  Glycemic control for   <7.0% adults with diabetes     CBG: Recent Labs  Lab 05/19/21 1640 05/19/21 1945 05/19/21 2310 05/20/21 0310 05/20/21 0741  GLUCAP 145* 209* 164* 111* 130*    Review of Systems:   Awake and interactive  Past Medical History:  He,  has a past medical history of Asthma, CHF  (congestive heart failure) (HCC), and Obesity.   Surgical History:   Past Surgical History:  Procedure Laterality Date  . NO PAST SURGERIES       Social History:   reports that he has been smoking cigarettes. He has a 20.00 pack-year smoking history. He has never used smokeless tobacco. He reports current drug use. Drug: Marijuana. He reports that he does not drink alcohol.   Family History:  His family history includes Breast cancer in his mother; Congestive Heart Failure in his maternal grandmother; Diabetes in his mother; Emphysema in his maternal  grandmother.   Allergies No Known Allergies   The patient is critically ill with multiple organ systems failure and requires high complexity decision making for assessment and support, frequent evaluation and titration of therapies, application of advanced monitoring technologies and extensive interpretation of multiple databases. Critical Care Time devoted to patient care services described in this note independent of APP/resident time (if applicable)  is 35 minutes.   JD Anselm Lis [facility] 05/20/2021, 8:25 AM  Please see [email or web address] for pager details.  From 7A-7P if no response, please call [phone]. After hours, please call ELink [phone].

## 2021-05-21 DIAGNOSIS — I5033 Acute on chronic diastolic (congestive) heart failure: Secondary | ICD-10-CM

## 2021-05-21 DIAGNOSIS — J9601 Acute respiratory failure with hypoxia: Secondary | ICD-10-CM

## 2021-05-21 LAB — GLUCOSE, CAPILLARY
Glucose-Capillary: 114 mg/dL — ABNORMAL HIGH (ref 70–99)
Glucose-Capillary: 150 mg/dL — ABNORMAL HIGH (ref 70–99)
Glucose-Capillary: 182 mg/dL — ABNORMAL HIGH (ref 70–99)
Glucose-Capillary: 93 mg/dL (ref 70–99)

## 2021-05-21 LAB — BASIC METABOLIC PANEL
Anion gap: 6 (ref 5–15)
BUN: 30 mg/dL — ABNORMAL HIGH (ref 6–20)
CO2: 44 mmol/L — ABNORMAL HIGH (ref 22–32)
Calcium: 8.7 mg/dL — ABNORMAL LOW (ref 8.9–10.3)
Chloride: 87 mmol/L — ABNORMAL LOW (ref 98–111)
Creatinine, Ser: 1.02 mg/dL (ref 0.61–1.24)
GFR, Estimated: >60 mL/min (ref >60)
Glucose, Bld: 106 mg/dL — ABNORMAL HIGH (ref 70–99)
Potassium: 4.3 mmol/L (ref 3.5–5.1)
Sodium: 137 mmol/L (ref 135–145)

## 2021-05-21 LAB — CBC
HCT: 50.9 % (ref 39.0–52.0)
Hemoglobin: 15 g/dL (ref 13.0–17.0)
MCH: 29.3 pg (ref 26.0–34.0)
MCHC: 29.5 g/dL — ABNORMAL LOW (ref 30.0–36.0)
MCV: 99.4 fL (ref 80.0–100.0)
Platelets: 290 10*3/uL (ref 150–400)
RBC: 5.12 MIL/uL (ref 4.22–5.81)
RDW: 14.4 % (ref 11.5–15.5)
WBC: 22.7 10*3/uL — ABNORMAL HIGH (ref 4.0–10.5)
nRBC: 0.2 % (ref 0.0–0.2)

## 2021-05-21 MED ORDER — FUROSEMIDE 40 MG PO TABS: ORAL_TABLET | ORAL | 3 refills | Status: DC

## 2021-05-21 MED ORDER — POTASSIUM CHLORIDE CRYS ER 20 MEQ PO TBCR: 20.0000 meq | EXTENDED_RELEASE_TABLET | Freq: Every day | ORAL | 11 refills | Status: DC

## 2021-05-21 MED ORDER — FUROSEMIDE 80 MG PO TABS: 80.0000 mg | ORAL_TABLET | Freq: Two times a day (BID) | ORAL | 11 refills | Status: DC

## 2021-05-21 MED ORDER — GUAIFENESIN ER 600 MG PO TB12
600.0000 mg | ORAL_TABLET | Freq: Two times a day (BID) | ORAL | Status: DC
  Administered 2021-05-21: 600 mg via ORAL
  Filled 2021-05-21: qty 1

## 2021-05-21 MED ORDER — SPIRIVA RESPIMAT 1.25 MCG/ACT IN AERS
1.0000 | INHALATION_SPRAY | Freq: Every day | RESPIRATORY_TRACT | 11 refills | Status: DC
Start: 2021-05-21 — End: 2021-12-26

## 2021-05-21 NOTE — Progress Notes (Signed)
All belongings returned to pt, and all PIVs removed. Pt was taken downstairs in the wheelchair on home O2 by this RN and NT. Patient was picked up by family.

## 2021-05-21 NOTE — Progress Notes (Signed)
This RN has reviewed AVS summary with patient and answered all questions. Ambulatory O2 test was performed this AM per orders. Education and care plan have been completed.   At this time pt is just waiting for his home O2 and tubing to be delivered. Per Marcelle Smiling, case manager, the O2 supplies will be delivered this afternoon. Pt will be discharged at that time. RN will continue to care for pt.

## 2021-05-21 NOTE — TOC Initial Note (Addendum)
Transition of Care [facility]) - Initial/Assessment Note    Patient Details  Name: Joseph Morales MRN: [id number] Date of Birth: [birth year]  Transition of Care [facility]) CM/SW Contact:    Golda Acre, RN Phone Number: 05/21/2021, 9:41 AM  Clinical Narrative:                 History of Present Illness:  Patient came into the hospital with worsening shortness of breath, has been struggling for couple weeks Claims compliance usually with medications but sometimes does forget Has not had any fevers or chills No increased cough or chest pain or discomfort Worsening shortness of breath, leg edema Has had multiple admissions in the past for decompensated diastolic failure, patient states symptoms feel like when he has had decompensation in the past  Pertinent  Medical History       Past Medical History:  Diagnosis Date  . Asthma   . CHF (congestive heart failure) (HCC)   . Obesity    Significant Hospital Events: Including procedures, antibiotic start and stop dates in addition to other pertinent events    Was on BiPAP through the night  Interim History / Subjective:  Awake alert interactive Feels much better Attempting to wean off BiPAP PLAN: to return to home  o2 via aresol mask at 8l/min weaned off bipap, iv zithromax, rocephin, wbc -22.7 Unable to determine needs at this time. Following for progression.  Plan is to return to home with self care as ac hospital. o2 ordered through [employer] Expected Discharge Plan: Home/Self Care Barriers to Discharge: Continued Medical Work up   Patient Goals and CMS Choice Patient states their goals for this hospitalization and ongoing recovery are:: to go home CMS Medicare.gov Compare Post Acute Care list provided to:: Patient Choice offered to / list presented to : Patient  Expected Discharge Plan and Services Expected Discharge Plan: Home/Self Care   Discharge Planning Services: CM Consult   Living arrangements for the past 2  months: Single Family Home                                      Prior Living Arrangements/Services Living arrangements for the past 2 months: Single Family Home Lives with:: Self Patient language and need for interpreter reviewed:: Yes Do you feel safe going back to the place where you live?: Yes            Criminal Activity/Legal Involvement Pertinent to Current Situation/Hospitalization: No - Comment as needed  Activities of Daily Living Home Assistive Devices/Equipment: CPAP ADL Screening (condition at time of admission) Patient's cognitive ability adequate to safely complete daily activities?: Yes Is the patient deaf or have difficulty hearing?: No Does the patient have difficulty seeing, even when wearing glasses/contacts?: No Does the patient have difficulty concentrating, remembering, or making decisions?: No Patient able to express need for assistance with ADLs?: Yes Does the patient have difficulty dressing or bathing?: No Independently performs ADLs?: Yes (appropriate for developmental age) Does the patient have difficulty walking or climbing stairs?: Yes Weakness of Legs: Both Weakness of Arms/Hands: None  Permission Sought/Granted                  Emotional Assessment Appearance:: Appears stated age Attitude/Demeanor/Rapport: Engaged Affect (typically observed): Calm Orientation: : Oriented to Situation,Oriented to  Time,Oriented to Place,Oriented to Self Alcohol / Substance Use: Not Applicable Psych Involvement: No (comment)  Admission diagnosis:  Acute  respiratory failure with hypoxia (HCC) [J96.01] Hypercapnic respiratory failure (HCC) [J96.92] Patient Active Problem List   Diagnosis Date Noted  . Hypercapnic respiratory failure (HCC) 05/18/2021  . Erectile dysfunction 02/22/2021  . Tobacco abuse 10/24/2020  . Chronic respiratory failure with hypoxia and hypercapnia (HCC) 10/16/2020  . Essential hypertension 10/16/2020  . Hyperlipidemia  10/16/2020  . Asthma 06/21/2020  . Chronic diastolic heart failure (HCC) 06/21/2020  . Hypoxia 06/21/2020  . Orthopnea 06/21/2020  . Chronic venous insufficiency 06/21/2020  . Cellulitis 06/21/2020  . Obesity, Class III, BMI 40-49.9 (morbid obesity) (HCC) 06/21/2020  . Obesity hypoventilation syndrome (HCC) 06/21/2020  . Leucocytosis 06/21/2020  . Chronic respiratory alkalosis 06/21/2020  . Hypertensive urgency 06/21/2020  . Calf tenderness 06/21/2020   PCP:  Patient, No Pcp Per (Inactive) Pharmacy:   [id number] [place] - [address] AT [facility] [address] [place] [address] Phone: [phone] Fax: [phone]  [facility] - [place], [place] - [address] [place] [address] Phone: [phone] Fax: [phone]     Social Determinants of Health (SDOH) Interventions    Readmission Risk Interventions No flowsheet data found.

## 2021-05-21 NOTE — Evaluation (Signed)
Physical Therapy Evaluation Patient Details Name: Joseph Morales MRN: [id number] DOB: [birth year] Today's Date: 05/21/2021   History of Present Illness  Patient is a 50 yo male with pertinent past medical history for chronic hypercapnia, chronic diastolic chf (EF 40-98%), asthma, HTN, HLD, tobacco use, BMI 47, and suspected OHS admitted to [facility] on 05/18/2021 for acute on chronic hypercapnic respiratory failure and worsening shortness of breath.  Clinical Impression  The patient  Reports that he is going  Home today. Has ambulate in room. Got up to recliner, on 2 L SPO2 98%. Patient does not require further PT at this time.    Follow Up Recommendations No PT follow up    Equipment Recommendations  None recommended by PT    Recommendations for Other Services       Precautions / Restrictions Precautions Precaution Comments: sats      Mobility  Bed Mobility Overal bed mobility: Independent                  Transfers Overall transfer level: Independent                  Ambulation/Gait Ambulation/Gait assistance: Independent   Assistive device: None Gait Pattern/deviations: Wide base of support;WFL(Within Functional Limits)   Gait velocity interpretation: 1.31 - 2.62 ft/sec, indicative of limited community ambulator General Gait Details: reports that he just amb. in room, declined to hall walk, states to Dc home  Stairs            Wheelchair Mobility    Modified Rankin (Stroke Patients Only)       Balance Overall balance assessment: Independent                                           Pertinent Vitals/Pain Pain Assessment: No/denies pain    Home Living Family/patient expects to be discharged to:: Private residence Living Arrangements: Alone Available Help at Discharge: Family Type of Home: House Home Access: Level entry     Home Layout: One level Home Equipment: None      Prior Function Level of Independence: Independent                Hand Dominance        Extremity/Trunk Assessment   Upper Extremity Assessment Upper Extremity Assessment: Overall WFL for tasks assessed    Lower Extremity Assessment Lower Extremity Assessment: Overall WFL for tasks assessed    Cervical / Trunk Assessment Cervical / Trunk Assessment: Normal  Communication   Communication: No difficulties  Cognition Arousal/Alertness: Awake/alert Behavior During Therapy: WFL for tasks assessed/performed Overall Cognitive Status: Within Functional Limits for tasks assessed                                        General Comments      Exercises     Assessment/Plan    PT Assessment Patent does not need any further PT services  PT Problem List         PT Treatment Interventions      PT Goals (Current goals can be found in the Care Plan section)  Acute Rehab PT Goals Patient Stated Goal: home PT Goal Formulation: All assessment and education complete, DC therapy    Frequency     Barriers to discharge  Co-evaluation               AM-PAC PT "6 Clicks" Mobility  Outcome Measure Help needed turning from your back to your side while in a flat bed without using bedrails?: None Help needed moving from lying on your back to sitting on the side of a flat bed without using bedrails?: None Help needed moving to and from a bed to a chair (including a wheelchair)?: None Help needed standing up from a chair using your arms (e.g., wheelchair or bedside chair)?: None Help needed to walk in hospital room?: None Help needed climbing 3-5 steps with a railing? : None 6 Click Score: 24    End of Session Equipment Utilized During Treatment: Oxygen Activity Tolerance: Patient tolerated treatment well Patient left: in chair;with call bell/phone within reach Nurse Communication: Mobility status PT Visit Diagnosis: Difficulty in walking, not elsewhere classified (R26.2)    Time: 0454-0981 PT  Time Calculation (min) (ACUTE ONLY): 12 min   Charges:              Blanchard Kelch PT Acute Rehabilitation Services Pager [phone] Office [phone]   Rada Hay 05/21/2021, 12:49 PM

## 2021-05-30 ENCOUNTER — Ambulatory Visit: Payer: Medicaid Other | Admitting: Adult Health

## 2021-06-10 ENCOUNTER — Ambulatory Visit: Payer: Medicaid Other | Admitting: Primary Care

## 2021-06-18 ENCOUNTER — Other Ambulatory Visit: Payer: Self-pay

## 2021-06-18 ENCOUNTER — Encounter: Payer: Self-pay | Admitting: Primary Care

## 2021-06-18 ENCOUNTER — Ambulatory Visit (INDEPENDENT_AMBULATORY_CARE_PROVIDER_SITE_OTHER): Payer: Medicaid Other | Admitting: Primary Care

## 2021-06-18 VITALS — BP 142/88 | HR 90 | Temp 98.0°F | Ht 75.0 in | Wt 373.0 lb

## 2021-06-18 DIAGNOSIS — J4540 Moderate persistent asthma, uncomplicated: Secondary | ICD-10-CM

## 2021-06-18 DIAGNOSIS — Z720 Tobacco use: Secondary | ICD-10-CM

## 2021-06-18 DIAGNOSIS — J9611 Chronic respiratory failure with hypoxia: Secondary | ICD-10-CM

## 2021-06-18 DIAGNOSIS — I5032 Chronic diastolic (congestive) heart failure: Secondary | ICD-10-CM

## 2021-06-18 DIAGNOSIS — E662 Morbid (severe) obesity with alveolar hypoventilation: Secondary | ICD-10-CM

## 2021-06-18 DIAGNOSIS — J9612 Chronic respiratory failure with hypercapnia: Secondary | ICD-10-CM

## 2021-06-18 DIAGNOSIS — F17210 Nicotine dependence, cigarettes, uncomplicated: Secondary | ICD-10-CM

## 2021-06-18 MED ORDER — BUDESONIDE-FORMOTEROL FUMARATE 160-4.5 MCG/ACT IN AERO: 2.0000 | INHALATION_SPRAY | Freq: Two times a day (BID) | RESPIRATORY_TRACT | 11 refills | Status: AC

## 2021-06-18 MED ORDER — NICOTINE 14 MG/24HR TD PT24: 14.0000 mg | MEDICATED_PATCH | Freq: Every day | TRANSDERMAL | 0 refills | Status: AC

## 2021-06-18 NOTE — Assessment & Plan Note (Signed)
-   Continue Symbicort 160 and Spiriva Respimat 1.36mcg  - Refills have been sent to pharmacy

## 2021-06-18 NOTE — Assessment & Plan Note (Signed)
-   He continues to smoke 5-6 cigarettes a day. Rare marijuana use, no vaping  - Encourage smoking cessation, spent 3-5 min on counseling - Sending in RX for nicotine patches/ Begin with step 2 (14 mg/day) for 6 weeks, followed by step 3 (7 mg/day) for 2 weeks.

## 2021-06-18 NOTE — Assessment & Plan Note (Addendum)
-   Patient was admitted from 05/18/21-05/21/21 with acute on chronic respiratory failure. He came in today without his oxygen. He still is requiring 4L with exertion to maintain O2 >90%.

## 2021-06-18 NOTE — Assessment & Plan Note (Addendum)
-   He is still awaiting on BIPAP machine with [facility]. He originally missed a visit with them back in May 2022 due to confusion on date/time. We spoke with [facility] today and they tell us that there is a backorder on BIPAP machines currently. Advised them that this was a priority order and they will be getting back to [place] within the next day or two to update [place] on status.

## 2021-06-18 NOTE — Assessment & Plan Note (Signed)
-   Continues to have some leg swelling despite compliance with Lasix 80mg  twice daily. We will check BNP level and refer to cardiology

## 2021-06-18 NOTE — Progress Notes (Signed)
@Patient  ID: , male    DOB: 03/06/1971, 50 y.o.   MRN: [id number]  No chief complaint on file.   Referring provider: No ref. provider found  HPI: 50 year old male, current smoker. PMH decompensated heart failure, asthma and chronic respiratory failure. Patient of Dr. 57, last seen in office on 02/22/21.   06/18/2021 Patient presents today for hospital follow-up. He was admitted from 05/18/21-05/21/21 with acute on chronic respiratory failure, obesity hypoventilation syndrome, asthma exacerbation and HF. Overall he feels well. Breathing is better since hospitalization. If he does too much he gets wided. He ran out of Symbicort two days ago. He is taking Spiriva 1 puff twice a day, advised he should only be using this once a day in the morning. He is not wearing his oxygen today, he tells me that he wears it at home 24/7 and at night. He is trying to wean off cigarettes. He does not vape but has one. Rare marijuana use. He needs refill nicotine patch.   He has not received BIPAP machine yet. He originally had an apt to pick up machine with [facility] on May 5th but missed his apt due to date mix up. He called DME company and they do not have a machine available at this time.   He is compliant with lasix 80mg . He tells me that he takes it has he is supposed to but still has leg swelling. He does not have a cardiologist.    No Known Allergies   There is no immunization history on file for this patient.  Past Medical History:  Diagnosis Date   Asthma    CHF (congestive heart failure) (HCC)    Obesity     Tobacco History: Social History   Tobacco Use  Smoking Status Every Day   Packs/day: 1.00   Years: 20.00   Pack years: 20.00   Types: Cigarettes  Smokeless Tobacco Never  Tobacco Comments   down to 2-3 cigarettes daily 02/22/21   Ready to quit: Not Answered Counseling given: Not Answered Tobacco comments: down to 2-3 cigarettes daily 02/22/21   Outpatient Medications  Prior to Visit  Medication Sig Dispense Refill   albuterol (VENTOLIN HFA) 108 (90 Base) MCG/ACT inhaler Inhale 2 puffs into the lungs every 6 (six) hours as needed for wheezing or shortness of breath. 6.7 g 2   carvedilol (COREG) 6.25 MG tablet Take 6.25 mg by mouth 2 (two) times daily.     furosemide (LASIX) 80 MG tablet Take 1 tablet (80 mg total) by mouth 2 (two) times daily. 60 tablet 11   losartan (COZAAR) 50 MG tablet Take 50 mg by mouth daily.     OXYGEN Inhale into the lungs at bedtime.     potassium chloride SA (KLOR-CON) 20 MEQ tablet Take 1 tablet (20 mEq total) by mouth daily. 30 tablet 11   Tiotropium Bromide Monohydrate (SPIRIVA RESPIMAT) 1.25 MCG/ACT AERS Inhale 1 puff into the lungs daily. 1 each 11   budesonide-formoterol (SYMBICORT) 160-4.5 MCG/ACT inhaler INHALE 2 PUFFS INTO THE LUNGS IN THE MORNING AND AT BEDTIME. 10.2 g 3   No facility-administered medications prior to visit.      Review of Systems  Review of Systems  Constitutional: Negative.   HENT: Negative.    Respiratory:  Negative for chest tightness, shortness of breath and wheezing.        Dyspnea with exertion  Cardiovascular:  Positive for leg swelling. Negative for chest pain.    Physical Exam  BP (!) 142/88   Pulse 90   Temp 98 F (36.7 C)   Ht 6\' 3"  (1.905 m)   Wt (!) 373 lb (169.2 kg)   SpO2 92%   BMI 46.62 kg/m  Physical Exam Constitutional:      General: He is not in acute distress.    Appearance: Normal appearance. He is obese. He is not ill-appearing.  HENT:     Head: Normocephalic and atraumatic.     Mouth/Throat:     Mouth: Mucous membranes are moist.     Pharynx: Oropharynx is clear.  Cardiovascular:     Rate and Rhythm: Normal rate and regular rhythm.     Comments: + 1-2 BLE/pedal edema Pulmonary:     Effort: Pulmonary effort is normal.     Breath sounds: Normal breath sounds.  Musculoskeletal:        General: Normal range of motion.  Skin:    General: Skin is warm and  dry.  Neurological:     General: No focal deficit present.     Mental Status: He is alert and oriented to person, place, and time. Mental status is at baseline.  Psychiatric:        Mood and Affect: Mood normal.        Behavior: Behavior normal.        Thought Content: Thought content normal.        Judgment: Judgment normal.     Lab Results:  CBC    Component Value Date/Time   WBC 22.7 (H) 05/21/2021 0244   RBC 5.12 05/21/2021 0244   HGB 15.0 05/21/2021 0244   HGB 15.4 08/01/2020 1415   HCT 50.9 05/21/2021 0244   HCT 49.0 08/01/2020 1415   PLT 290 05/21/2021 0244   PLT 232 08/01/2020 1415   MCV 99.4 05/21/2021 0244   MCV 93 08/01/2020 1415   MCH 29.3 05/21/2021 0244   MCHC 29.5 (L) 05/21/2021 0244   RDW 14.4 05/21/2021 0244   RDW 12.2 08/01/2020 1415   LYMPHSABS 3.2 05/18/2021 1628   LYMPHSABS 2.8 08/01/2020 1415   MONOABS 1.5 (H) 05/18/2021 1628   EOSABS 0.2 05/18/2021 1628   EOSABS 0.4 08/01/2020 1415   BASOSABS 0.1 05/18/2021 1628   BASOSABS 0.1 08/01/2020 1415    BMET    Component Value Date/Time   NA 137 05/21/2021 0244   NA 141 08/01/2020 1415   K 4.3 05/21/2021 0244   CL 87 (L) 05/21/2021 0244   CO2 44 (H) 05/21/2021 0244   GLUCOSE 106 (H) 05/21/2021 0244   BUN 30 (H) 05/21/2021 0244   BUN 14 08/01/2020 1415   CREATININE 1.02 05/21/2021 0244   CALCIUM 8.7 (L) 05/21/2021 0244   GFRNONAA >60 05/21/2021 0244   GFRAA 109 08/01/2020 1415    BNP    Component Value Date/Time   BNP 207.3 (H) 05/18/2021 1628    ProBNP No results found for: PROBNP  Imaging: No results found.   Assessment & Plan:   Chronic respiratory failure with hypoxia and hypercapnia (HCC) - Patient was admitted from 05/18/21-05/21/21 with acute on chronic respiratory failure. He came in today without his oxygen. He still is requiring 4L with exertion to maintain O2 >90%.   Obesity hypoventilation syndrome (HCC) - He is still awaiting on BIPAP machine with [facility]. He originally  missed a visit with them back in May 2022 due to confusion on date/time. We spoke with [facility] today and they tell June 2022 that there is a backorder on BIPAP  machines currently. Advised them that this was a priority order and they will be getting back to [place] within the next day or two to update [place] on status.   Asthma - Continue Symbicort 160 and Spiriva Respimat 1.30mcg  - Refills have been sent to pharmacy   Chronic diastolic heart failure (HCC) - Continues to have some leg swelling despite compliance with Lasix 80mg  twice daily. We will check BNP level and refer to cardiology   FU in 6-8 weeks with Dr. or APP   Vassie Loll, NP 06/18/2021

## 2021-06-18 NOTE — Patient Instructions (Addendum)
Chronic respiratory failure: - Please continue to wear __4___ oxygen with exertion and at night - We are checking on status of BIPAP machine with [facility] ( if you have not heard from them in 1-2 weeks please notify our office)  Heart failure: - Continue Lasix 80mg  twice daily  Asthma:  - Continue Symbicort two puffs twice daily (refill sent to pharmacy, please pick up RX) - Continue Spiriva one puff ONCE a day in morning   Orders: - Labs today (ordered) - Walk test in office, if O2 sustaining <88% please titrate oxygen to keep >88-90%  Rx: - Nicotine patches/ Begin with step 2 (14 mg/day) for 6 weeks, followed by step 3 (7 mg/day) for 2 weeks.   Follow-up: - 6-8 weeks with Dr.   Steps to Quit Smoking Smoking tobacco is the leading cause of preventable death. It can affect almost every organ in the body. Smoking puts you and people around you at risk for many serious, long-lasting (chronic) diseases. Quitting smoking can be hard, but it is one of the best things thatyou can do for your health. It is never too late to quit. How do I get ready to quit? When you decide to quit smoking, make a plan to help you succeed. Before you quit: Pick a date to quit. Set a date within the next 2 weeks to give you time to prepare. Write down the reasons why you are quitting. Keep this list in places where you will see it often. Tell your family, friends, and co-workers that you are quitting. Their support is important. Talk with your doctor about the choices that may help you quit. Find out if your health insurance will pay for these treatments. Know the people, places, things, and activities that make you want to smoke (triggers). Avoid them. What first steps can I take to quit smoking? Throw away all cigarettes at home, at work, and in your car. Throw away the things that you use when you smoke, such as ashtrays and lighters. Clean your car. Make sure to empty the ashtray. Clean your home,  including curtains and carpets. What can I do to help me quit smoking? Talk with your doctor about taking medicines and seeing a counselor at the same time. You are more likely to succeed when you do both. If you are pregnant or breastfeeding, talk with your doctor about counseling or other ways to quit smoking. Do not take medicine to help you quit smoking unless your doctor tells you to do so. To quit smoking: Quit right away Quit smoking totally, instead of slowly cutting back on how much you smoke over a period of time. Go to counseling. You are more likely to quit if you go to counseling sessions regularly. Take medicine You may take medicines to help you quit. Some medicines need a prescription, and some you can buy over-the-counter. Some medicines may contain a drug called nicotine to replace the nicotine in cigarettes. Medicines may: Help you to stop having the desire to smoke (cravings). Help to stop the problems that come when you stop smoking (withdrawal symptoms). Your doctor may ask you to use: Nicotine patches, gum, or lozenges. Nicotine inhalers or sprays. Non-nicotine medicine that is taken by mouth. Find resources Find resources and other ways to help you quit smoking and remain smoke-free after you quit. These resources are most helpful when you use them often. They include: Online chats with a Vassie Loll. Phone quitlines. Printed Veterinary surgeon. Support groups or group  counseling. Text messaging programs. Mobile phone apps. Use apps on your mobile phone or tablet that can help you stick to your quit plan. There are many free apps for mobile phones and tablets as well as websites. Examples include Quit Guide from the [employer] and smokefree.gov  What things can I do to make it easier to quit?  Talk to your family and friends. Ask them to support and encourage you. Call a phone quitline (1-800-QUIT-NOW), reach out to support groups, or work with a Veterinary surgeon. Ask people who  smoke to not smoke around you. Avoid places that make you want to smoke, such as: Bars. Parties. Smoke-break areas at work. Spend time with people who do not smoke. Lower the stress in your life. Stress can make you want to smoke. Try these things to help your stress: Getting regular exercise. Doing deep-breathing exercises. Doing yoga. Meditating. Doing a body scan. To do this, close your eyes, focus on one area of your body at a time from head to toe. Notice which parts of your body are tense. Try to relax the muscles in those areas. How will I feel when I quit smoking? Day 1 to 3 weeks Within the first 24 hours, you may start to have some problems that come from quitting tobacco. These problems are very bad 2-3 days after you quit, but they do not often last for more than 2-3 weeks. You may get these symptoms: Mood swings. Feeling restless, nervous, angry, or annoyed. Trouble concentrating. Dizziness. Strong desire for high-sugar foods and nicotine. Weight gain. Trouble pooping (constipation). Feeling like you may vomit (nausea). Coughing or a sore throat. Changes in how the medicines that you take for other issues work in your body. Depression. Trouble sleeping (insomnia). Week 3 and afterward After the first 2-3 weeks of quitting, you may start to notice more positive results, such as: Better sense of smell and taste. Less coughing and sore throat. Slower heart rate. Lower blood pressure. Clearer skin. Better breathing. Fewer sick days. Quitting smoking can be hard. Do not give up if you fail the first time. Some people need to try a few times before they succeed. Do your best to stick to your quit plan, and talk with yourdoctor if you have any questions or concerns. Summary Smoking tobacco is the leading cause of preventable death. Quitting smoking can be hard, but it is one of the best things that you can do for your health. When you decide to quit smoking, make a plan  to help you succeed. Quit smoking right away, not slowly over a period of time. When you start quitting, seek help from your doctor, family, or friends. This information is not intended to replace advice given to you by your health care provider. Make sure you discuss any questions you have with your healthcare provider. Document Revised: 09/09/2019 Document Reviewed: 03/05/2019 Elsevier Patient Education  2022 [employer].

## 2021-06-19 ENCOUNTER — Other Ambulatory Visit: Payer: Self-pay | Admitting: *Deleted

## 2021-06-19 DIAGNOSIS — I5032 Chronic diastolic (congestive) heart failure: Secondary | ICD-10-CM

## 2021-06-19 LAB — BRAIN NATRIURETIC PEPTIDE: Pro B Natriuretic peptide (BNP): 16 pg/mL (ref 0.0–100.0)

## 2021-06-19 NOTE — Progress Notes (Signed)
Please let patient know that his BNP was normal. He does not need to increase his lasix. Can you please refer to cardiology if he does not already see one re: heart failure

## 2021-08-21 ENCOUNTER — Ambulatory Visit: Payer: Medicaid Other | Admitting: Cardiology

## 2021-08-21 ENCOUNTER — Encounter: Payer: Self-pay | Admitting: Cardiology

## 2021-08-21 NOTE — Progress Notes (Deleted)
Cardiology Office Note   Date:  08/21/2021   ID:  Joseph Morales, DOB 03-09-1971, MRN [id number]  PCP:  Patient, No Pcp Per (Inactive)  Cardiologist:   None Referring:  ***  No chief complaint on file.     History of Present Illness: Joseph Morales is a 50 y.o. male who presents for ***    He has a history of respiratory failure thought to be related to obesity hypoventilation syndrome asthma and acute on chronic diastolic heart failure.  He also has tobacco use.  He has sleep apnea.  He is being followed by pulmonary but has not yet had a cardiologist.  I reviewed pulmonary records.  I reviewed records of a hospitalization from May.  He had to be treated with BiPAP for significant hypoxic respiratory failure.  He was diuresed about 5.6 L.  He was discharged home on 4 L of oxygen.  Looks like his discharge weight might have been about 47.62 kg.   Past Medical History:  Diagnosis Date   Asthma    CHF (congestive heart failure) (HCC)    Obesity     Past Surgical History:  Procedure Laterality Date   NO PAST SURGERIES       Current Outpatient Medications  Medication Sig Dispense Refill   albuterol (VENTOLIN HFA) 108 (90 Base) MCG/ACT inhaler Inhale 2 puffs into the lungs every 6 (six) hours as needed for wheezing or shortness of breath. 6.7 g 2   budesonide-formoterol (SYMBICORT) 160-4.5 MCG/ACT inhaler INHALE 2 PUFFS INTO THE LUNGS IN THE MORNING AND AT BEDTIME. 10.2 g 11   carvedilol (COREG) 6.25 MG tablet Take 6.25 mg by mouth 2 (two) times daily.     furosemide (LASIX) 80 MG tablet Take 1 tablet (80 mg total) by mouth 2 (two) times daily. 60 tablet 11   losartan (COZAAR) 50 MG tablet Take 50 mg by mouth daily.     OXYGEN Inhale into the lungs at bedtime.     potassium chloride SA (KLOR-CON) 20 MEQ tablet Take 1 tablet (20 mEq total) by mouth daily. 30 tablet 11   Tiotropium Bromide Monohydrate (SPIRIVA RESPIMAT) 1.25 MCG/ACT AERS Inhale 1 puff into the lungs daily. 1 each 11    No current facility-administered medications for this visit.    Allergies:   Patient has no known allergies.    Social History:  The patient  reports that he has been smoking cigarettes. He has a 20.00 pack-year smoking history. He has never used smokeless tobacco. He reports current drug use. Drug: Marijuana. He reports that he does not drink alcohol.   Family History:  The patient's ***family history includes Breast cancer in his mother; Congestive Heart Failure in his maternal grandmother; Diabetes in his mother; Emphysema in his maternal grandmother.    ROS:  Please see the history of present illness.   Otherwise, review of systems are positive for {NONE DEFAULTED:18576}.   All other systems are reviewed and negative.    PHYSICAL EXAM: VS:  There were no vitals taken for this visit. , BMI There is no height or weight on file to calculate BMI. GENERAL:  Well appearing HEENT:  Pupils equal round and reactive, fundi not visualized, oral mucosa unremarkable NECK:  No jugular venous distention, waveform within normal limits, carotid upstroke brisk and symmetric, no bruits, no thyromegaly LYMPHATICS:  No cervical, inguinal adenopathy LUNGS:  Clear to auscultation bilaterally BACK:  No CVA tenderness CHEST:  Unremarkable HEART:  PMI not displaced or sustained,S1  and S2 within normal limits, no S3, no S4, no clicks, no rubs, *** murmurs ABD:  Flat, positive bowel sounds normal in frequency in pitch, no bruits, no rebound, no guarding, no midline pulsatile mass, no hepatomegaly, no splenomegaly EXT:  2 plus pulses throughout, no edema, no cyanosis no clubbing SKIN:  No rashes no nodules NEURO:  Cranial nerves II through XII grossly intact, motor grossly intact throughout PSYCH:  Cognitively intact, oriented to person place and time    EKG:  EKG {ACTION; IS/IS VZC:[id number]} ordered today. The ekg ordered today demonstrates ***   Recent Labs: 05/18/2021: ALT 80; B Natriuretic Peptide  207.3 05/19/2021: Magnesium 2.5 05/21/2021: BUN 30; Creatinine, Ser 1.02; Hemoglobin 15.0; Platelets 290; Potassium 4.3; Sodium 137 06/18/2021: Pro B Natriuretic peptide (BNP) 16.0    Lipid Panel    Component Value Date/Time   CHOL 199 06/23/2020 0502   TRIG 137 06/23/2020 0502   HDL 35 (L) 06/23/2020 0502   CHOLHDL 5.7 06/23/2020 0502   VLDL 27 06/23/2020 0502   LDLCALC 137 (H) 06/23/2020 0502      Wt Readings from Last 3 Encounters:  06/18/21 (!) 373 lb (169.2 kg)  05/21/21 (!) 380 lb 15.3 oz (172.8 kg)  03/25/21 274 lb (124.3 kg)      Other studies Reviewed: Additional studies/ records that were reviewed today include: ***. Review of the above records demonstrates:  Please see elsewhere in the note.  ***   ASSESSMENT AND PLAN:  Morbid obesity:  ***  Acute on chronic diastolic heart failure:  Hypoventilation obesity syndrome:   Current medicines are reviewed at length with the patient today.  The patient {ACTIONS; HAS/DOES NOT HAVE:19233} concerns regarding medicines.  The following changes have been made:  {PLAN; NO CHANGE:13088:s}  Labs/ tests ordered today include: *** No orders of the defined types were placed in this encounter.    Disposition:   FU with ***    Signed, Rollene Rotunda, MD  08/21/2021 12:26 PM    [facility]

## 2021-10-01 ENCOUNTER — Telehealth: Payer: Self-pay | Admitting: Pulmonary Disease

## 2021-10-01 NOTE — Telephone Encounter (Signed)
I have called [employer] and they are going to have someone call the pt back about his machine. They are aware that his machine stopped working on Thursday/Friday.  I called the pt to make him aware that [employer] will be calling him

## 2021-10-08 NOTE — Telephone Encounter (Signed)
I have sent a community message to [place] with [employer] to see if they can reach out to the pt about his cpap.

## 2021-10-30 ENCOUNTER — Ambulatory Visit: Payer: Medicaid Other | Admitting: Cardiology

## 2021-12-03 NOTE — Progress Notes (Deleted)
Cardiology Office Note   Date:  12/03/2021   ID:  Joseph Morales, DOB [birth year], MRN [id number]  PCP:  Patient, No Pcp Per (Inactive)  Cardiologist:   None Referring:  ***  No chief complaint on file.     History of Present Illness: Joseph Morales is a 50 y.o. male who presents for evaluation of ***    He was in the hospital in May with respiratory failure.  I reviewed these records for this visit.    This was said to be related to hypoventilation obesity syndrome.  There was an element of acute diastolic HF.  [facility] Course 5/21: Patient admitted to [facility]; placed on BiPAP; ABG respiratory acidosis  5/22: ABG improving. Weaning to BiPAP QHS and prn while napping. Supplemental O2 during the day. As of 5/23: Patient only wearing BiPAP at night and prn; Supplemental O2 during the day; Receiving breathing treatment; feels like his breathing is at baseline. We had planned on sending him home but walking oximetry demonstrated that his O2 sats dropped down to 81% on 2 liters after just ambulating 10 steps. Because of this his discharge was canceled and he was given additional diuretics.  5/24 as of morning rounds I&O balance -5.6 liters. Resting and walking oximetry obtained and were as follows: dropped to 85% rebounded w/ 4 liters.  He will be discharged to home with the plan of care as outlined below.      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 Vassie Loll, suspect that his oxygen needs have actually been a bit higher than he has been using at home. He typically just leaves it on 2 liters. He has BIPAP waiting for him. Just needs to get it  Plan: Dc on home oxygen 4lpm at all times.  Patient will pick up his BIPAP device current plan is settings BiPAP therapy on 25/21 cm H2O with a Large size Resmed Full Face Mask AirFit F20 mask and heated humidification. - 3L O2 should be  blended in Home on 80mg  lasix daily in morning and 40 in afternoon. Daily weight w/ extra dose of 80 mg for 2 lb weight gain in 24 hours  F/u in our clinic w/in 1 week (6/2 at 2pm)   Decompensated diastolic heart failure with EF 75-80% Uncontrolled hypertension Plan: Home on coreg furosemide and Cozaar    Asthma exacerbation Plan Dc'd steroids Home on spiriva and symbicort w/ PRN SABA (has this at home)  no need for further abx (we think that this was more HF than asthma)   Leukocytosis: initially reactive. Then exacerbated by steroids Plan Stopping steroids Stopping abx     Hyperglycemia: due to  steroids Plan Dc steroids   Culture data/antimicrobials   Covid, flu, MRSA negative   Consults  Critical care   Past Medical History:  Diagnosis Date   Asthma    CHF (congestive heart failure) (HCC)    Chronic diastolic heart failure (HCC)    Hypertension    Hypoventilation associated with obesity syndrome (HCC)    Obesity     Past Surgical History:  Procedure Laterality Date   NO PAST SURGERIES       Current Outpatient Medications  Medication Sig Dispense Refill   albuterol (VENTOLIN HFA) 108 (90 Base) MCG/ACT inhaler Inhale 2 puffs into the lungs every 6 (six) hours as needed for wheezing or shortness of breath. 6.7 g  2   budesonide-formoterol (SYMBICORT) 160-4.5 MCG/ACT inhaler INHALE 2 PUFFS INTO THE LUNGS IN THE MORNING AND AT BEDTIME. 10.2 g 11   carvedilol (COREG) 6.25 MG tablet Take 6.25 mg by mouth 2 (two) times daily.     furosemide (LASIX) 80 MG tablet Take 1 tablet (80 mg total) by mouth 2 (two) times daily. 60 tablet 11   losartan (COZAAR) 50 MG tablet Take 50 mg by mouth daily.     OXYGEN Inhale into the lungs at bedtime.     potassium chloride SA (KLOR-CON) 20 MEQ tablet Take 1 tablet (20 mEq total) by mouth daily. 30 tablet 11   Tiotropium Bromide Monohydrate (SPIRIVA RESPIMAT) 1.25 MCG/ACT AERS Inhale 1 puff into the lungs daily. 1 each 11   No  current facility-administered medications for this visit.    Allergies:   Patient has no known allergies.    Social History:  The patient  reports that he has been smoking cigarettes. He has a 20.00 pack-year smoking history. He has never used smokeless tobacco. He reports current drug use. Drug: Marijuana. He reports that he does not drink alcohol.   Family History:  The patient's ***family history includes Breast cancer in his mother; Congestive Heart Failure in his maternal grandmother; Diabetes in his mother; Emphysema in his maternal grandmother.    ROS:  Please see the history of present illness.   Otherwise, review of systems are positive for {NONE DEFAULTED:18576}.   All other systems are reviewed and negative.    PHYSICAL EXAM: VS:  There were no vitals taken for this visit. , BMI There is no height or weight on file to calculate BMI. GENERAL:  Well appearing HEENT:  Pupils equal round and reactive, fundi not visualized, oral mucosa unremarkable NECK:  No jugular venous distention, waveform within normal limits, carotid upstroke brisk and symmetric, no bruits, no thyromegaly LYMPHATICS:  No cervical, inguinal adenopathy LUNGS:  Clear to auscultation bilaterally BACK:  No CVA tenderness CHEST:  Unremarkable HEART:  PMI not displaced or sustained,S1 and S2 within normal limits, no S3, no S4, no clicks, no rubs, *** murmurs ABD:  Flat, positive bowel sounds normal in frequency in pitch, no bruits, no rebound, no guarding, no midline pulsatile mass, no hepatomegaly, no splenomegaly EXT:  2 plus pulses throughout, no edema, no cyanosis no clubbing SKIN:  No rashes no nodules NEURO:  Cranial nerves II through XII grossly intact, motor grossly intact throughout PSYCH:  Cognitively intact, oriented to person place and time    EKG:  EKG {ACTION; IS/IS XHB:[id number]} ordered today. The ekg ordered today demonstrates ***   Recent Labs: 05/18/2021: ALT 80; B Natriuretic Peptide  207.3 05/19/2021: Magnesium 2.5 05/21/2021: BUN 30; Creatinine, Ser 1.02; Hemoglobin 15.0; Platelets 290; Potassium 4.3; Sodium 137 06/18/2021: Pro B Natriuretic peptide (BNP) 16.0    Lipid Panel    Component Value Date/Time   CHOL 199 06/23/2020 0502   TRIG 137 06/23/2020 0502   HDL 35 (L) 06/23/2020 0502   CHOLHDL 5.7 06/23/2020 0502   VLDL 27 06/23/2020 0502   LDLCALC 137 (H) 06/23/2020 0502      Wt Readings from Last 3 Encounters:  06/18/21 (!) 373 lb (169.2 kg)  05/21/21 (!) 380 lb 15.3 oz (172.8 kg)  03/25/21 274 lb (124.3 kg)      Other studies Reviewed: Additional studies/ records that were reviewed today include: ***. Review of the above records demonstrates:  Please see elsewhere in the note.  ***   ASSESSMENT  AND PLAN:  DIASTOLIC HF:  ***  HYPOVENTILATION OBESITY SYNDROME:  ***    Current medicines are reviewed at length with the patient today.  The patient {ACTIONS; HAS/DOES NOT HAVE:19233} concerns regarding medicines.  The following changes have been made:  {PLAN; NO CHANGE:13088:s}  Labs/ tests ordered today include: *** No orders of the defined types were placed in this encounter.    Disposition:   FU with ***    Signed, Rollene Rotunda, MD  12/03/2021 8:07 PM    [facility]

## 2021-12-05 ENCOUNTER — Ambulatory Visit: Payer: Medicaid Other | Admitting: Cardiology

## 2021-12-20 ENCOUNTER — Emergency Department (HOSPITAL_COMMUNITY)
Admission: EM | Admit: 2021-12-20 | Discharge: 2021-12-20 | Disposition: A | Discharge status: Home / Self Care | Payer: Medicaid Other | Attending: Emergency Medicine | Admitting: Emergency Medicine

## 2021-12-20 ENCOUNTER — Emergency Department (HOSPITAL_COMMUNITY): Payer: Medicaid Other

## 2021-12-20 ENCOUNTER — Other Ambulatory Visit: Payer: Self-pay

## 2021-12-20 ENCOUNTER — Encounter (HOSPITAL_COMMUNITY): Payer: Self-pay | Admitting: Emergency Medicine

## 2021-12-20 DIAGNOSIS — I110 Hypertensive heart disease with heart failure: Secondary | ICD-10-CM | POA: Diagnosis not present

## 2021-12-20 DIAGNOSIS — J441 Chronic obstructive pulmonary disease with (acute) exacerbation: Secondary | ICD-10-CM | POA: Diagnosis not present

## 2021-12-20 DIAGNOSIS — Z7951 Long term (current) use of inhaled steroids: Secondary | ICD-10-CM | POA: Diagnosis not present

## 2021-12-20 DIAGNOSIS — J45909 Unspecified asthma, uncomplicated: Secondary | ICD-10-CM | POA: Diagnosis not present

## 2021-12-20 DIAGNOSIS — Z79899 Other long term (current) drug therapy: Secondary | ICD-10-CM | POA: Insufficient documentation

## 2021-12-20 DIAGNOSIS — F17210 Nicotine dependence, cigarettes, uncomplicated: Secondary | ICD-10-CM | POA: Insufficient documentation

## 2021-12-20 DIAGNOSIS — R0602 Shortness of breath: Secondary | ICD-10-CM | POA: Diagnosis present

## 2021-12-20 DIAGNOSIS — I5032 Chronic diastolic (congestive) heart failure: Secondary | ICD-10-CM | POA: Insufficient documentation

## 2021-12-20 DIAGNOSIS — Z20822 Contact with and (suspected) exposure to covid-19: Secondary | ICD-10-CM | POA: Diagnosis not present

## 2021-12-20 LAB — COMPREHENSIVE METABOLIC PANEL
ALT: 90 U/L — ABNORMAL HIGH (ref 0–44)
AST: 64 U/L — ABNORMAL HIGH (ref 15–41)
Albumin: 4 g/dL (ref 3.5–5.0)
Alkaline Phosphatase: 67 U/L (ref 38–126)
Anion gap: 6 (ref 5–15)
BUN: 17 mg/dL (ref 6–20)
CO2: 32 mmol/L (ref 22–32)
Calcium: 8.8 mg/dL — ABNORMAL LOW (ref 8.9–10.3)
Chloride: 94 mmol/L — ABNORMAL LOW (ref 98–111)
Creatinine, Ser: 1.18 mg/dL (ref 0.61–1.24)
GFR, Estimated: >60 mL/min (ref >60)
Glucose, Bld: 201 mg/dL — ABNORMAL HIGH (ref 70–99)
Potassium: 4.9 mmol/L (ref 3.5–5.1)
Sodium: 132 mmol/L — ABNORMAL LOW (ref 135–145)
Total Bilirubin: 1.5 mg/dL — ABNORMAL HIGH (ref 0.3–1.2)
Total Protein: 7.9 g/dL (ref 6.5–8.1)

## 2021-12-20 LAB — CBC WITH DIFFERENTIAL/PLATELET
Abs Immature Granulocytes: 0.12 10*3/uL — ABNORMAL HIGH (ref 0.00–0.07)
Basophils Absolute: 0.1 10*3/uL (ref 0.0–0.1)
Basophils Relative: 1 %
Eosinophils Absolute: 0 10*3/uL (ref 0.0–0.5)
Eosinophils Relative: 0 %
HCT: 47.9 % (ref 39.0–52.0)
Hemoglobin: 15 g/dL (ref 13.0–17.0)
Immature Granulocytes: 1 %
Lymphocytes Relative: 13 %
Lymphs Abs: 1.5 10*3/uL (ref 0.7–4.0)
MCH: 29.4 pg (ref 26.0–34.0)
MCHC: 31.3 g/dL (ref 30.0–36.0)
MCV: 93.7 fL (ref 80.0–100.0)
Monocytes Absolute: 0.4 10*3/uL (ref 0.1–1.0)
Monocytes Relative: 4 %
Neutro Abs: 9 10*3/uL — ABNORMAL HIGH (ref 1.7–7.7)
Neutrophils Relative %: 81 %
Platelets: 223 10*3/uL (ref 150–400)
RBC: 5.11 MIL/uL (ref 4.22–5.81)
RDW: 13.1 % (ref 11.5–15.5)
WBC: 11.1 10*3/uL — ABNORMAL HIGH (ref 4.0–10.5)
nRBC: 0 % (ref 0.0–0.2)

## 2021-12-20 LAB — RESP PANEL BY RT-PCR (FLU A&B, COVID) ARPGX2
Influenza A by PCR: POSITIVE — AB
Influenza B by PCR: NEGATIVE
SARS Coronavirus 2 by RT PCR: NEGATIVE

## 2021-12-20 LAB — BRAIN NATRIURETIC PEPTIDE: B Natriuretic Peptide: 13.8 pg/mL (ref 0.0–100.0)

## 2021-12-20 MED ORDER — MAGNESIUM SULFATE 2 GM/50ML IV SOLN
2.0000 g | Freq: Once | INTRAVENOUS | Status: AC
  Administered 2021-12-20: 14:00:00 2 g via INTRAVENOUS
  Filled 2021-12-20: qty 50

## 2021-12-20 MED ORDER — DOXYCYCLINE HYCLATE 100 MG PO CAPS: 100.0000 mg | ORAL_CAPSULE | Freq: Two times a day (BID) | ORAL | 0 refills | Status: DC

## 2021-12-20 MED ORDER — PREDNISONE 10 MG PO TABS: 20.0000 mg | ORAL_TABLET | Freq: Every day | ORAL | 0 refills | Status: DC

## 2021-12-20 MED ORDER — ALBUTEROL SULFATE (5 MG/ML) 0.5% IN NEBU: 2.5000 mg | INHALATION_SOLUTION | RESPIRATORY_TRACT | 0 refills | Status: DC | PRN

## 2021-12-20 NOTE — ED Triage Notes (Signed)
Per [facility] pt coming from home for shortness of breath over the past couple days. Patient states he has asthma and CHF. Has been using inhaler. Initial room air sat on scene was 73%. Was given a total of 12.5 albuterol, 1.0 Atrovent and 125 mg solu medrol en route by EMS. On arrival patients room air sat was 88%. Placed on 3L [place]. Patient reports feeling much better since breathing treatments. Wheezing noted.

## 2021-12-20 NOTE — ED Notes (Signed)
ED Provider at bedside. 

## 2021-12-20 NOTE — Discharge Instructions (Signed)
Follow-up with your doctor next week for recheck.  Make sure you get some of the nasal oxygen connectors before you leave the emergency department.  Return to the emergency department with any problems

## 2021-12-20 NOTE — ED Provider Notes (Signed)
[facility]-EMERGENCY DEPT Provider Note   CSN: [id number] Arrival date & time: 12/20/21  1232     History Chief Complaint  Patient presents with   Shortness of Breath    Joseph Morales is a 50 y.o. male.  CityPatient complains of shortness of breath.  Patient has a history of asthma and congestive heart failure.  Patient is supposed to be on and at home but he has not been using it lately.  He also has been coughing up some yellow-green stuff  The history is provided by the patient and medical records. No language interpreter was used.  Shortness of Breath Severity:  Moderate Onset quality:  Sudden Timing:  Constant Progression:  Worsening Chronicity:  New Context: activity   Relieved by:  Nothing Worsened by:  Nothing Ineffective treatments:  None tried Associated symptoms: wheezing   Associated symptoms: no abdominal pain, no chest pain, no cough, no headaches and no rash       Past Medical History:  Diagnosis Date   Asthma    CHF (congestive heart failure) (HCC)    Chronic diastolic heart failure (HCC)    Hypertension    Hypoventilation associated with obesity syndrome (HCC)    Obesity     Patient Active Problem List   Diagnosis Date Noted   Hypercapnic respiratory failure (HCC) 05/18/2021   Erectile dysfunction 02/22/2021   Tobacco abuse 10/24/2020   Chronic respiratory failure with hypoxia and hypercapnia (HCC) 10/16/2020   Essential hypertension 10/16/2020   Hyperlipidemia 10/16/2020   Asthma 06/21/2020   Chronic diastolic heart failure (HCC) 06/21/2020   Orthopnea 06/21/2020   Chronic venous insufficiency 06/21/2020   Cellulitis 06/21/2020   Obesity, Class III, BMI 40-49.9 (morbid obesity) (HCC) 06/21/2020   Obesity hypoventilation syndrome (HCC) 06/21/2020   Leucocytosis 06/21/2020   Chronic respiratory alkalosis 06/21/2020   Hypertensive urgency 06/21/2020   Calf tenderness 06/21/2020    Past Surgical History:  Procedure  Laterality Date   NO PAST SURGERIES         Family History  Problem Relation Age of Onset   Breast cancer Mother    Diabetes Mother    Emphysema Maternal Grandmother    Congestive Heart Failure Maternal Grandmother     Social History   Tobacco Use   Smoking status: Every Day    Packs/day: 1.00    Years: 20.00    Pack years: 20.00    Types: Cigarettes   Smokeless tobacco: Never   Tobacco comments:    down to 2-3 cigarettes daily 02/22/21  Vaping Use   Vaping Use: Never used  Substance Use Topics   Alcohol use: Never   Drug use: Yes    Types: Marijuana    Comment: occasional marijuana    Home Medications Prior to Admission medications   Medication Sig Start Date End Date Taking? Authorizing Provider  albuterol (PROVENTIL) (5 MG/ML) 0.5% nebulizer solution Take 0.5 mLs (2.5 mg total) by nebulization every 4 (four) hours as needed for wheezing or shortness of breath. 12/20/21  Yes Bethann Berkshire, MD  doxycycline (VIBRAMYCIN) 100 MG capsule Take 1 capsule (100 mg total) by mouth 2 (two) times daily. 12/20/21  Yes Bethann Berkshire, MD  predniSONE (DELTASONE) 10 MG tablet Take 2 tablets (20 mg total) by mouth daily. 12/20/21  Yes Bethann Berkshire, MD  budesonide-formoterol [facility]) 160-4.5 MCG/ACT inhaler INHALE 2 PUFFS INTO THE LUNGS IN THE MORNING AND AT BEDTIME. 06/18/21 06/18/22  Glenford Bayley, NP  carvedilol (COREG) 6.25 MG tablet  Take 6.25 mg by mouth 2 (two) times daily. 04/23/21   [provider]  furosemide (LASIX) 80 MG tablet Take 1 tablet (80 mg total) by mouth 2 (two) times daily. 05/21/21 05/21/22  Erick Colace, NP  losartan (COZAAR) 50 MG tablet Take 50 mg by mouth daily. 04/23/21   [provider]  OXYGEN Inhale into the lungs at bedtime.    [provider]  potassium chloride SA (KLOR-CON) 20 MEQ tablet Take 1 tablet (20 mEq total) by mouth daily. 05/21/21   Erick Colace, NP  Tiotropium Bromide Monohydrate (SPIRIVA RESPIMAT) 1.25  MCG/ACT AERS Inhale 1 puff into the lungs daily. 05/21/21   Erick Colace, NP    Allergies    Patient has no known allergies.  Review of Systems   Review of Systems  Constitutional:  Negative for appetite change and fatigue.  HENT:  Negative for congestion, ear discharge and sinus pressure.   Eyes:  Negative for discharge.  Respiratory:  Positive for shortness of breath and wheezing. Negative for cough.   Cardiovascular:  Negative for chest pain.  Gastrointestinal:  Negative for abdominal pain and diarrhea.  Genitourinary:  Negative for frequency and hematuria.  Musculoskeletal:  Negative for back pain.  Skin:  Negative for rash.  Neurological:  Negative for seizures and headaches.  Psychiatric/Behavioral:  Negative for hallucinations.    Physical Exam Updated Vital Signs BP (!) 164/71    Pulse 100    Temp 99.2 F (37.3 C) (Oral)    Resp 20    Ht 6\' 3"  (1.905 m)    Wt (!) 171.5 kg    SpO2 92%    BMI 47.25 kg/m   Physical Exam Vitals and nursing note reviewed.  Constitutional:      Appearance: He is well-developed.  HENT:     Head: Normocephalic.     Nose: Nose normal.  Eyes:     General: No scleral icterus.    Conjunctiva/sclera: Conjunctivae normal.  Neck:     Thyroid: No thyromegaly.  Cardiovascular:     Rate and Rhythm: Normal rate and regular rhythm.     Heart sounds: No murmur heard.   No friction rub. No gallop.  Pulmonary:     Breath sounds: No stridor. Wheezing present. No rales.  Chest:     Chest wall: No tenderness.  Abdominal:     General: There is no distension.     Tenderness: There is no abdominal tenderness. There is no rebound.  Musculoskeletal:        General: Normal range of motion.     Cervical back: Neck supple.  Lymphadenopathy:     Cervical: No cervical adenopathy.  Skin:    Findings: No erythema or rash.  Neurological:     Mental Status: He is alert and oriented to person, place, and time.     Motor: No abnormal muscle tone.      Coordination: Coordination normal.  Psychiatric:        Behavior: Behavior normal.    ED Results / Procedures / Treatments   Labs (all labs ordered are listed, but only abnormal results are displayed) Labs Reviewed  RESP PANEL BY RT-PCR (FLU A&B, COVID) ARPGX2 - Abnormal; Notable for the following components:      Result Value   Influenza A by PCR POSITIVE (*)    All other components within normal limits  CBC WITH DIFFERENTIAL/PLATELET - Abnormal; Notable for the following components:   WBC 11.1 (*)  Neutro Abs 9.0 (*)    Abs Immature Granulocytes 0.12 (*)    All other components within normal limits  COMPREHENSIVE METABOLIC PANEL - Abnormal; Notable for the following components:   Sodium 132 (*)    Chloride 94 (*)    Glucose, Bld 201 (*)    Calcium 8.8 (*)    AST 64 (*)    ALT 90 (*)    Total Bilirubin 1.5 (*)    All other components within normal limits  BRAIN NATRIURETIC PEPTIDE    EKG None  Radiology DG Chest Portable 1 View  Result Date: 12/20/2021 CLINICAL DATA:  Shortness of breath EXAM: PORTABLE CHEST 1 VIEW COMPARISON:  Radiograph 05/18/2021, chest CT 10/16/2020 FINDINGS: Unchanged mildly enlarged heart. There is no focal airspace consolidation. There is no large pleural effusion. There is no visible pneumothorax. There is no acute osseous abnormality. IMPRESSION: Mild central pulmonary vascular congestion. No focal airspace consolidation. Electronically Signed   By: Maurine Simmering M.D.   On: 12/20/2021 13:57    Procedures Procedures   Medications Ordered in ED Medications  magnesium sulfate IVPB 2 g 50 mL (0 g Intravenous Stopped 12/20/21 1510)    ED Course  I have reviewed the triage vital signs and the nursing notes.  Pertinent labs & imaging results that were available during my care of the patient were reviewed by me and considered in my medical decision making (see chart for details). Patient improved with the steroids and neb treatments given by the  ambulance.  Chest x-ray unremarkable.  Patient not heart failure.  Patient with exacerbation of asthma.   MDM Rules/Calculators/A&P                         Patient with asthma exacerbation.  He will be sent home on prednisone doxycycline and nebulized solution Proventil.  He is going to get some nasal oxygen connectors because he ran out of those.  He will build to use his own oxygen at home and follow-up with his doctor next week    Final Clinical Impression(s) / ED Diagnoses Final diagnoses:  COPD exacerbation ([place])    Rx / DC Orders ED Discharge Orders          Ordered    predniSONE (DELTASONE) 10 MG tablet  Daily        12/20/21 1512    doxycycline (VIBRAMYCIN) 100 MG capsule  2 times daily        12/20/21 1512    albuterol (PROVENTIL) (5 MG/ML) 0.5% nebulizer solution  Every 4 hours PRN        12/20/21 1512             Milton Ferguson, MD 12/20/21 1517

## 2021-12-23 ENCOUNTER — Inpatient Hospital Stay (HOSPITAL_COMMUNITY)
Admission: EM | Admit: 2021-12-23 | Discharge: 2021-12-26 | DRG: 189 | Disposition: A | Discharge status: Home / Self Care | Payer: Medicaid Other | Attending: Internal Medicine | Admitting: Internal Medicine

## 2021-12-23 ENCOUNTER — Other Ambulatory Visit: Payer: Self-pay

## 2021-12-23 DIAGNOSIS — I110 Hypertensive heart disease with heart failure: Secondary | ICD-10-CM | POA: Diagnosis present

## 2021-12-23 DIAGNOSIS — E662 Morbid (severe) obesity with alveolar hypoventilation: Secondary | ICD-10-CM | POA: Diagnosis present

## 2021-12-23 DIAGNOSIS — J101 Influenza due to other identified influenza virus with other respiratory manifestations: Secondary | ICD-10-CM | POA: Diagnosis present

## 2021-12-23 DIAGNOSIS — J9621 Acute and chronic respiratory failure with hypoxia: Principal | ICD-10-CM | POA: Diagnosis present

## 2021-12-23 DIAGNOSIS — Z825 Family history of asthma and other chronic lower respiratory diseases: Secondary | ICD-10-CM

## 2021-12-23 DIAGNOSIS — J45909 Unspecified asthma, uncomplicated: Secondary | ICD-10-CM | POA: Diagnosis present

## 2021-12-23 DIAGNOSIS — J9601 Acute respiratory failure with hypoxia: Secondary | ICD-10-CM | POA: Diagnosis present

## 2021-12-23 DIAGNOSIS — Z20822 Contact with and (suspected) exposure to covid-19: Secondary | ICD-10-CM | POA: Diagnosis present

## 2021-12-23 DIAGNOSIS — Z79899 Other long term (current) drug therapy: Secondary | ICD-10-CM

## 2021-12-23 DIAGNOSIS — Z6841 Body Mass Index (BMI) 40.0 and over, adult: Secondary | ICD-10-CM

## 2021-12-23 DIAGNOSIS — I5032 Chronic diastolic (congestive) heart failure: Secondary | ICD-10-CM | POA: Diagnosis present

## 2021-12-23 DIAGNOSIS — Z803 Family history of malignant neoplasm of breast: Secondary | ICD-10-CM

## 2021-12-23 DIAGNOSIS — J9622 Acute and chronic respiratory failure with hypercapnia: Secondary | ICD-10-CM | POA: Diagnosis present

## 2021-12-23 DIAGNOSIS — J45901 Unspecified asthma with (acute) exacerbation: Secondary | ICD-10-CM | POA: Diagnosis present

## 2021-12-23 DIAGNOSIS — F17210 Nicotine dependence, cigarettes, uncomplicated: Secondary | ICD-10-CM | POA: Diagnosis present

## 2021-12-23 DIAGNOSIS — I5033 Acute on chronic diastolic (congestive) heart failure: Secondary | ICD-10-CM | POA: Diagnosis present

## 2021-12-23 DIAGNOSIS — Z833 Family history of diabetes mellitus: Secondary | ICD-10-CM

## 2021-12-23 DIAGNOSIS — Z7951 Long term (current) use of inhaled steroids: Secondary | ICD-10-CM

## 2021-12-23 DIAGNOSIS — Z9981 Dependence on supplemental oxygen: Secondary | ICD-10-CM

## 2021-12-23 DIAGNOSIS — J9602 Acute respiratory failure with hypercapnia: Secondary | ICD-10-CM | POA: Diagnosis present

## 2021-12-23 DIAGNOSIS — Z8249 Family history of ischemic heart disease and other diseases of the circulatory system: Secondary | ICD-10-CM

## 2021-12-23 DIAGNOSIS — E785 Hyperlipidemia, unspecified: Secondary | ICD-10-CM | POA: Diagnosis present

## 2021-12-23 LAB — CBC WITH DIFFERENTIAL/PLATELET
Abs Immature Granulocytes: 0.18 10*3/uL — ABNORMAL HIGH (ref 0.00–0.07)
Basophils Absolute: 0.1 10*3/uL (ref 0.0–0.1)
Basophils Relative: 1 %
Eosinophils Absolute: 0.1 10*3/uL (ref 0.0–0.5)
Eosinophils Relative: 1 %
HCT: 51.5 % (ref 39.0–52.0)
Hemoglobin: 15 g/dL (ref 13.0–17.0)
Immature Granulocytes: 1 %
Lymphocytes Relative: 19 %
Lymphs Abs: 2.5 10*3/uL (ref 0.7–4.0)
MCH: 28.9 pg (ref 26.0–34.0)
MCHC: 29.1 g/dL — ABNORMAL LOW (ref 30.0–36.0)
MCV: 99.2 fL (ref 80.0–100.0)
Monocytes Absolute: 1.5 10*3/uL — ABNORMAL HIGH (ref 0.1–1.0)
Monocytes Relative: 12 %
Neutro Abs: 8.7 10*3/uL — ABNORMAL HIGH (ref 1.7–7.7)
Neutrophils Relative %: 66 %
Platelets: 238 10*3/uL (ref 150–400)
RBC: 5.19 MIL/uL (ref 4.22–5.81)
RDW: 12.7 % (ref 11.5–15.5)
WBC: 13.1 10*3/uL — ABNORMAL HIGH (ref 4.0–10.5)
nRBC: 0 % (ref 0.0–0.2)

## 2021-12-23 LAB — COMPREHENSIVE METABOLIC PANEL
ALT: 107 U/L — ABNORMAL HIGH (ref 0–44)
AST: 64 U/L — ABNORMAL HIGH (ref 15–41)
Albumin: 3.7 g/dL (ref 3.5–5.0)
Alkaline Phosphatase: 69 U/L (ref 38–126)
Anion gap: 6 (ref 5–15)
BUN: 25 mg/dL — ABNORMAL HIGH (ref 6–20)
CO2: 35 mmol/L — ABNORMAL HIGH (ref 22–32)
Calcium: 8.6 mg/dL — ABNORMAL LOW (ref 8.9–10.3)
Chloride: 96 mmol/L — ABNORMAL LOW (ref 98–111)
Creatinine, Ser: 1.07 mg/dL (ref 0.61–1.24)
GFR, Estimated: >60 mL/min (ref >60)
Glucose, Bld: 156 mg/dL — ABNORMAL HIGH (ref 70–99)
Potassium: 4.9 mmol/L (ref 3.5–5.1)
Sodium: 137 mmol/L (ref 135–145)
Total Bilirubin: 1.2 mg/dL (ref 0.3–1.2)
Total Protein: 7.2 g/dL (ref 6.5–8.1)

## 2021-12-23 LAB — BRAIN NATRIURETIC PEPTIDE: B Natriuretic Peptide: 16.1 pg/mL (ref 0.0–100.0)

## 2021-12-23 MED ORDER — IPRATROPIUM-ALBUTEROL 0.5-2.5 (3) MG/3ML IN SOLN
3.0000 mL | Freq: Once | RESPIRATORY_TRACT | Status: AC
  Administered 2021-12-24: 01:00:00 3 mL via RESPIRATORY_TRACT
  Filled 2021-12-23: qty 9

## 2021-12-23 NOTE — ED Provider Notes (Signed)
Emergency Medicine Provider Triage Evaluation Note  Joseph Morales , a 50 y.o. male  was evaluated in triage.  Patient w/ hx of CHF, CHF, hypertension, & chronic respiratory failure who presents to the ED with complaints of progressively worsening dyspnea x 5 days. Reports to wear 3L O2 via [place] @ home PRN. Progressive worsening dyspnea & wheezing, diagnosed with influenza a few days ago- he is unsure if he has been taking tamiflu. EMS started duoneb en route which is still being administered. .  Review of Systems  Positive: Dyspnea, wheezing Negative: Chest pain, vomiting, fever, chills.   Physical Exam  BP (!) 148/77    Pulse 96    Temp 98.9 F (37.2 C)    Resp 19    Ht 6\' 3"  (1.905 m)    Wt (!) 171 kg    SpO2 96%    BMI 47.12 kg/m  Gen:   Awake, resting comfortably on duoneb Resp:  Poor air movement with wheezing present.  MSK:   LE edema bilaterally.      Medical Decision Making  Medically screening exam initiated at 10:51 PM.  Appropriate orders placed.  was informed that the remainder of the evaluation will be completed by another provider, this initial triage assessment does not replace that evaluation, and the importance of remaining in the ED until their evaluation is complete.  23:10: RE-EVAL: improved air movement S/p duoneb, remains with expiratory wheezing throughout, SpO2 93% on RA  23:20: RE-EVAL: Desaturated to 83%, [place] O2 applied, increased to maintain SpO2 90-94%.    23:55: Patient seems to be sleepy more so than on initial arrival, SpO2 92%, have been adjusting his [place] to maintain, will check ABG as I suspect patient is likely hypercapnic, additional duoneb ordered as well.   Patient continued to be somnolent yet is easily arousible, charge RN looking for acute care bed for patient to be transferred to, will hold in triage until able.    Leonia Corona 12/24/21 12/26/21    [id number], MD 12/24/21 [phone]

## 2021-12-23 NOTE — ED Triage Notes (Signed)
Pt brought in by EMS for shortness of breath. Pt has a history of COPD and CHF. Pt recently diagnosed with the flu. Per EMS, pt desats to the 70s upon exertion. Pt received a duoneb treatment from EMS.

## 2021-12-24 ENCOUNTER — Emergency Department (HOSPITAL_COMMUNITY): Payer: Medicaid Other

## 2021-12-24 ENCOUNTER — Encounter (HOSPITAL_COMMUNITY): Payer: Self-pay | Admitting: Internal Medicine

## 2021-12-24 DIAGNOSIS — R0602 Shortness of breath: Secondary | ICD-10-CM | POA: Diagnosis present

## 2021-12-24 DIAGNOSIS — J9601 Acute respiratory failure with hypoxia: Secondary | ICD-10-CM | POA: Diagnosis not present

## 2021-12-24 DIAGNOSIS — J9602 Acute respiratory failure with hypercapnia: Secondary | ICD-10-CM | POA: Diagnosis not present

## 2021-12-24 DIAGNOSIS — J4540 Moderate persistent asthma, uncomplicated: Secondary | ICD-10-CM

## 2021-12-24 DIAGNOSIS — Z7951 Long term (current) use of inhaled steroids: Secondary | ICD-10-CM | POA: Diagnosis not present

## 2021-12-24 DIAGNOSIS — E662 Morbid (severe) obesity with alveolar hypoventilation: Secondary | ICD-10-CM | POA: Diagnosis present

## 2021-12-24 DIAGNOSIS — E785 Hyperlipidemia, unspecified: Secondary | ICD-10-CM | POA: Diagnosis present

## 2021-12-24 DIAGNOSIS — Z8249 Family history of ischemic heart disease and other diseases of the circulatory system: Secondary | ICD-10-CM | POA: Diagnosis not present

## 2021-12-24 DIAGNOSIS — J45901 Unspecified asthma with (acute) exacerbation: Secondary | ICD-10-CM | POA: Diagnosis present

## 2021-12-24 DIAGNOSIS — J9622 Acute and chronic respiratory failure with hypercapnia: Secondary | ICD-10-CM | POA: Diagnosis present

## 2021-12-24 DIAGNOSIS — Z9981 Dependence on supplemental oxygen: Secondary | ICD-10-CM | POA: Diagnosis not present

## 2021-12-24 DIAGNOSIS — Z803 Family history of malignant neoplasm of breast: Secondary | ICD-10-CM | POA: Diagnosis not present

## 2021-12-24 DIAGNOSIS — J101 Influenza due to other identified influenza virus with other respiratory manifestations: Secondary | ICD-10-CM

## 2021-12-24 DIAGNOSIS — J9621 Acute and chronic respiratory failure with hypoxia: Secondary | ICD-10-CM | POA: Diagnosis present

## 2021-12-24 DIAGNOSIS — I5032 Chronic diastolic (congestive) heart failure: Secondary | ICD-10-CM | POA: Diagnosis present

## 2021-12-24 DIAGNOSIS — Z6841 Body Mass Index (BMI) 40.0 and over, adult: Secondary | ICD-10-CM | POA: Diagnosis not present

## 2021-12-24 DIAGNOSIS — Z825 Family history of asthma and other chronic lower respiratory diseases: Secondary | ICD-10-CM | POA: Diagnosis not present

## 2021-12-24 DIAGNOSIS — I110 Hypertensive heart disease with heart failure: Secondary | ICD-10-CM | POA: Diagnosis present

## 2021-12-24 DIAGNOSIS — Z79899 Other long term (current) drug therapy: Secondary | ICD-10-CM | POA: Diagnosis not present

## 2021-12-24 DIAGNOSIS — F17210 Nicotine dependence, cigarettes, uncomplicated: Secondary | ICD-10-CM | POA: Diagnosis present

## 2021-12-24 DIAGNOSIS — Z833 Family history of diabetes mellitus: Secondary | ICD-10-CM | POA: Diagnosis not present

## 2021-12-24 DIAGNOSIS — Z20822 Contact with and (suspected) exposure to covid-19: Secondary | ICD-10-CM | POA: Diagnosis present

## 2021-12-24 LAB — BASIC METABOLIC PANEL
Anion gap: 4 — ABNORMAL LOW (ref 5–15)
BUN: 26 mg/dL — ABNORMAL HIGH (ref 6–20)
CO2: 35 mmol/L — ABNORMAL HIGH (ref 22–32)
Calcium: 8.5 mg/dL — ABNORMAL LOW (ref 8.9–10.3)
Chloride: 97 mmol/L — ABNORMAL LOW (ref 98–111)
Creatinine, Ser: 1.12 mg/dL (ref 0.61–1.24)
GFR, Estimated: >60 mL/min (ref >60)
Glucose, Bld: 168 mg/dL — ABNORMAL HIGH (ref 70–99)
Potassium: 6.1 mmol/L — ABNORMAL HIGH (ref 3.5–5.1)
Sodium: 136 mmol/L (ref 135–145)

## 2021-12-24 LAB — I-STAT ARTERIAL BLOOD GAS, ED
Acid-Base Excess: 7 mmol/L — ABNORMAL HIGH (ref 0.0–2.0)
Acid-Base Excess: 9 mmol/L — ABNORMAL HIGH (ref 0.0–2.0)
Bicarbonate: 38.4 mmol/L — ABNORMAL HIGH (ref 20.0–28.0)
Bicarbonate: 38.6 mmol/L — ABNORMAL HIGH (ref 20.0–28.0)
Calcium, Ion: 1.24 mmol/L (ref 1.15–1.40)
Calcium, Ion: 1.23 mmol/L (ref 1.15–1.40)
HCT: 49 % (ref 39.0–52.0)
HCT: 49 % (ref 39.0–52.0)
Hemoglobin: 16.7 g/dL (ref 13.0–17.0)
Hemoglobin: 16.7 g/dL (ref 13.0–17.0)
O2 Saturation: 90 %
O2 Saturation: 93 %
Patient temperature: 98.9
Potassium: 4.8 mmol/L (ref 3.5–5.1)
Potassium: 5.4 mmol/L — ABNORMAL HIGH (ref 3.5–5.1)
Sodium: 138 mmol/L (ref 135–145)
Sodium: 137 mmol/L (ref 135–145)
TCO2: 41 mmol/L — ABNORMAL HIGH (ref 22–32)
TCO2: 41 mmol/L — ABNORMAL HIGH (ref 22–32)
pCO2 arterial: 82.6 mmHg (ref 32.0–48.0)
pCO2 arterial: 70.9 mmHg (ref 32.0–48.0)
pH, Arterial: 7.276 — ABNORMAL LOW (ref 7.350–7.450)
pH, Arterial: 7.344 — ABNORMAL LOW (ref 7.350–7.450)
pO2, Arterial: 70 mmHg — ABNORMAL LOW (ref 83.0–108.0)
pO2, Arterial: 73 mmHg — ABNORMAL LOW (ref 83.0–108.0)

## 2021-12-24 LAB — CBC
HCT: 51.1 % (ref 39.0–52.0)
Hemoglobin: 15.1 g/dL (ref 13.0–17.0)
MCH: 29.2 pg (ref 26.0–34.0)
MCHC: 29.5 g/dL — ABNORMAL LOW (ref 30.0–36.0)
MCV: 98.8 fL (ref 80.0–100.0)
Platelets: 244 10*3/uL (ref 150–400)
RBC: 5.17 MIL/uL (ref 4.22–5.81)
RDW: 12.6 % (ref 11.5–15.5)
WBC: 11.4 10*3/uL — ABNORMAL HIGH (ref 4.0–10.5)
nRBC: 0 % (ref 0.0–0.2)

## 2021-12-24 LAB — GLUCOSE, CAPILLARY: Glucose-Capillary: 108 mg/dL — ABNORMAL HIGH (ref 70–99)

## 2021-12-24 LAB — RESP PANEL BY RT-PCR (FLU A&B, COVID) ARPGX2
Influenza A by PCR: POSITIVE — AB
Influenza B by PCR: NEGATIVE
SARS Coronavirus 2 by RT PCR: NEGATIVE

## 2021-12-24 LAB — HIV ANTIBODY (ROUTINE TESTING W REFLEX): HIV Screen 4th Generation wRfx: NONREACTIVE

## 2021-12-24 MED ORDER — BUDESONIDE 0.25 MG/2ML IN SUSP
0.2500 mg | Freq: Two times a day (BID) | RESPIRATORY_TRACT | Status: DC
  Administered 2021-12-24 – 2021-12-26 (×4): 0.25 mg via RESPIRATORY_TRACT
  Filled 2021-12-24 (×7): qty 2

## 2021-12-24 MED ORDER — DOXYCYCLINE HYCLATE 100 MG PO TABS
100.0000 mg | ORAL_TABLET | Freq: Two times a day (BID) | ORAL | Status: DC
  Administered 2021-12-24 – 2021-12-25 (×3): 100 mg via ORAL
  Filled 2021-12-24 (×3): qty 1

## 2021-12-24 MED ORDER — BUPROPION HCL ER (SR) 150 MG PO TB12
300.0000 mg | ORAL_TABLET | Freq: Every day | ORAL | Status: DC
  Administered 2021-12-24 – 2021-12-26 (×3): 300 mg via ORAL
  Filled 2021-12-24 (×4): qty 2

## 2021-12-24 MED ORDER — PNEUMOCOCCAL VAC POLYVALENT 25 MCG/0.5ML IJ INJ
0.5000 mL | INJECTION | INTRAMUSCULAR | Status: DC
  Filled 2021-12-24: qty 0.5

## 2021-12-24 MED ORDER — METHYLPREDNISOLONE SODIUM SUCC 125 MG IJ SOLR
80.0000 mg | INTRAMUSCULAR | Status: DC
  Administered 2021-12-24 – 2021-12-25 (×2): 80 mg via INTRAVENOUS
  Filled 2021-12-24 (×2): qty 2

## 2021-12-24 MED ORDER — DEXAMETHASONE SODIUM PHOSPHATE 10 MG/ML IJ SOLN
10.0000 mg | Freq: Once | INTRAMUSCULAR | Status: AC
  Administered 2021-12-24: 01:00:00 10 mg via INTRAVENOUS
  Filled 2021-12-24: qty 1

## 2021-12-24 MED ORDER — INFLUENZA VAC SPLIT QUAD 0.5 ML IM SUSY: 0.5000 mL | PREFILLED_SYRINGE | INTRAMUSCULAR | Status: DC

## 2021-12-24 MED ORDER — ALBUTEROL SULFATE (2.5 MG/3ML) 0.083% IN NEBU: 2.5000 mg | INHALATION_SOLUTION | RESPIRATORY_TRACT | Status: DC | PRN

## 2021-12-24 MED ORDER — HYDRALAZINE HCL 20 MG/ML IJ SOLN: 10.0000 mg | INTRAMUSCULAR | Status: DC | PRN

## 2021-12-24 MED ORDER — LOSARTAN POTASSIUM 50 MG PO TABS
50.0000 mg | ORAL_TABLET | Freq: Every day | ORAL | Status: DC
  Administered 2021-12-24 – 2021-12-26 (×3): 50 mg via ORAL
  Filled 2021-12-24 (×4): qty 1

## 2021-12-24 MED ORDER — CARVEDILOL 6.25 MG PO TABS
6.2500 mg | ORAL_TABLET | Freq: Two times a day (BID) | ORAL | Status: DC
  Administered 2021-12-24 – 2021-12-26 (×4): 6.25 mg via ORAL
  Filled 2021-12-24 (×3): qty 1
  Filled 2021-12-24: qty 2
  Filled 2021-12-24: qty 1

## 2021-12-24 MED ORDER — ALBUTEROL (5 MG/ML) CONTINUOUS INHALATION SOLN
15.0000 mg/h | INHALATION_SOLUTION | Freq: Once | RESPIRATORY_TRACT | Status: DC
  Administered 2021-12-24: 02:00:00 15 mg/h via RESPIRATORY_TRACT

## 2021-12-24 MED ORDER — ALBUTEROL SULFATE (2.5 MG/3ML) 0.083% IN NEBU
2.5000 mg | INHALATION_SOLUTION | RESPIRATORY_TRACT | Status: DC
  Administered 2021-12-24: 04:00:00 2.5 mg via RESPIRATORY_TRACT
  Filled 2021-12-24: qty 3

## 2021-12-24 MED ORDER — FUROSEMIDE 40 MG PO TABS
80.0000 mg | ORAL_TABLET | Freq: Two times a day (BID) | ORAL | Status: DC
  Administered 2021-12-24 – 2021-12-26 (×4): 80 mg via ORAL
  Filled 2021-12-24 (×3): qty 2
  Filled 2021-12-24: qty 4
  Filled 2021-12-24: qty 2

## 2021-12-24 MED ORDER — ALBUTEROL SULFATE (2.5 MG/3ML) 0.083% IN NEBU: 15.0000 mg/h | INHALATION_SOLUTION | Freq: Once | RESPIRATORY_TRACT | Status: DC

## 2021-12-24 MED ORDER — ALBUTEROL SULFATE (2.5 MG/3ML) 0.083% IN NEBU
INHALATION_SOLUTION | RESPIRATORY_TRACT | Status: AC
  Administered 2021-12-24: 02:00:00 15 mg
  Filled 2021-12-24: qty 18

## 2021-12-24 MED ORDER — POTASSIUM CHLORIDE CRYS ER 20 MEQ PO TBCR
20.0000 meq | EXTENDED_RELEASE_TABLET | Freq: Every day | ORAL | Status: DC
Start: 2021-12-24 — End: 2021-12-24

## 2021-12-24 MED ORDER — IPRATROPIUM BROMIDE 0.02 % IN SOLN
0.5000 mg | RESPIRATORY_TRACT | Status: DC
  Administered 2021-12-24: 04:00:00 0.5 mg via RESPIRATORY_TRACT
  Filled 2021-12-24: qty 2.5

## 2021-12-24 MED ORDER — IPRATROPIUM-ALBUTEROL 0.5-2.5 (3) MG/3ML IN SOLN
3.0000 mL | RESPIRATORY_TRACT | Status: DC
  Administered 2021-12-24 – 2021-12-25 (×6): 3 mL via RESPIRATORY_TRACT
  Filled 2021-12-24 (×5): qty 3

## 2021-12-24 MED ORDER — ENOXAPARIN SODIUM 80 MG/0.8ML IJ SOSY
80.0000 mg | PREFILLED_SYRINGE | INTRAMUSCULAR | Status: DC
  Administered 2021-12-24 – 2021-12-25 (×2): 80 mg via SUBCUTANEOUS
  Filled 2021-12-24 (×2): qty 0.8

## 2021-12-24 NOTE — Progress Notes (Signed)
Patient off of bipap at this time and on a 4L [place].

## 2021-12-24 NOTE — ED Notes (Signed)
Lunch ordered 

## 2021-12-24 NOTE — Progress Notes (Signed)
Patient placed in observation after midnight.  Still requiring Bipap.  ABG improved but not normal so will continue Bipap.  Recent flu A+: outside window for treatment.  Continue supportive care. Marlin Canary DO

## 2021-12-24 NOTE — ED Notes (Signed)
Resp at bedside

## 2021-12-24 NOTE — ED Notes (Signed)
Radiology at bedside

## 2021-12-24 NOTE — ED Notes (Signed)
Provider at bedside

## 2021-12-24 NOTE — Plan of Care (Signed)
  Problem: Education: Goal: Knowledge of General Education information will improve Description Including pain rating scale, medication(s)/side effects and non-pharmacologic comfort measures Outcome: Progressing   

## 2021-12-24 NOTE — ED Notes (Signed)
Pt not in room at this time

## 2021-12-24 NOTE — ED Notes (Signed)
BiPap removed and placed on 4L via ; patient tolerating well, nourishment provided.

## 2021-12-24 NOTE — ED Notes (Signed)
Resp at bedside at this time 

## 2021-12-24 NOTE — ED Notes (Signed)
Resp remains at bedside at this time

## 2021-12-24 NOTE — ED Provider Notes (Signed)
[facility] EMERGENCY DEPARTMENT Provider Note   CSN: [id number] Arrival date & time: 12/23/21  2237     History Chief Complaint  Patient presents with   Shortness of Breath    Joseph Morales is a 50 y.o. male.  HPI     This 50 year old male with a history of CHF, asthma, obesity hypoventilation syndrome, chronic respiratory failure on 3 to 4 L of oxygen at home who presents with shortness of breath.  Patient tested positive for influenza 4 days ago.  Since that time he has had progressively worsening shortness of breath at home.  Reports some nonproductive cough and recent fevers.  He was maintaining his oxygen levels at 4 L at home.  He denies chest pain.  Upon EMS arrival he was noted to desaturate in the 70s with any exertion.  Patient was seen and evaluated several days ago for shortness of breath.  Noted influenza positive.  Was discharged on doxycycline, prednisone.  No Tamiflu at that time.  Past Medical History:  Diagnosis Date   Asthma    CHF (congestive heart failure) (HCC)    Chronic diastolic heart failure (HCC)    Hypertension    Hypoventilation associated with obesity syndrome (HCC)    Obesity     Patient Active Problem List   Diagnosis Date Noted   Hypercapnic respiratory failure (HCC) 05/18/2021   Erectile dysfunction 02/22/2021   Tobacco abuse 10/24/2020   Chronic respiratory failure with hypoxia and hypercapnia (HCC) 10/16/2020   Essential hypertension 10/16/2020   Hyperlipidemia 10/16/2020   Asthma 06/21/2020   Chronic diastolic heart failure (HCC) 06/21/2020   Orthopnea 06/21/2020   Chronic venous insufficiency 06/21/2020   Cellulitis 06/21/2020   Obesity, Class III, BMI 40-49.9 (morbid obesity) (HCC) 06/21/2020   Obesity hypoventilation syndrome (HCC) 06/21/2020   Leucocytosis 06/21/2020   Chronic respiratory alkalosis 06/21/2020   Hypertensive urgency 06/21/2020   Calf tenderness 06/21/2020    Past  Surgical History:  Procedure Laterality Date   NO PAST SURGERIES         Family History  Problem Relation Age of Onset   Breast cancer Mother    Diabetes Mother    Emphysema Maternal Grandmother    Congestive Heart Failure Maternal Grandmother     Social History   Tobacco Use   Smoking status: Every Day    Packs/day: 1.00    Years: 20.00    Pack years: 20.00    Types: Cigarettes   Smokeless tobacco: Never   Tobacco comments:    down to 2-3 cigarettes daily 02/22/21  Vaping Use   Vaping Use: Never used  Substance Use Topics   Alcohol use: Never   Drug use: Yes    Types: Marijuana    Comment: occasional marijuana    Home Medications Prior to Admission medications   Medication Sig Start Date End Date Taking? Authorizing Provider  albuterol (PROVENTIL) (5 MG/ML) 0.5% nebulizer solution Take 0.5 mLs (2.5 mg total) by nebulization every 4 (four) hours as needed for wheezing or shortness of breath. 12/20/21   Bethann Berkshire, MD  budesonide-formoterol (SYMBICORT) 160-4.5 MCG/ACT inhaler INHALE 2 PUFFS INTO THE LUNGS IN THE MORNING AND AT BEDTIME. 06/18/21 06/18/22  Glenford Bayley, NP  carvedilol (COREG) 6.25 MG tablet Take 6.25 mg by mouth 2 (two) times daily. 04/23/21   [provider]  doxycycline (VIBRAMYCIN) 100 MG capsule Take 1 capsule (100 mg total) by mouth 2 (two) times daily. 12/20/21   Bethann Berkshire, MD  furosemide (LASIX) 80 MG tablet Take 1 tablet (80 mg total) by mouth 2 (two) times daily. 05/21/21 05/21/22  Simonne Martinet, NP  losartan (COZAAR) 50 MG tablet Take 50 mg by mouth daily. 04/23/21   [provider]  OXYGEN Inhale into the lungs at bedtime.    [provider]  potassium chloride SA (KLOR-CON) 20 MEQ tablet Take 1 tablet (20 mEq total) by mouth daily. 05/21/21   Simonne Martinet, NP  predniSONE (DELTASONE) 10 MG tablet Take 2 tablets (20 mg total) by mouth daily. 12/20/21   Bethann Berkshire, MD  Tiotropium Bromide  Monohydrate (SPIRIVA RESPIMAT) 1.25 MCG/ACT AERS Inhale 1 puff into the lungs daily. 05/21/21   Simonne Martinet, NP    Allergies    Patient has no known allergies.  Review of Systems   Review of Systems  Constitutional:  Positive for fever.  Respiratory:  Positive for cough and shortness of breath. Negative for choking.   Cardiovascular:  Negative for chest pain.  Gastrointestinal:  Negative for abdominal pain, nausea and vomiting.  Genitourinary:  Negative for dysuria.  All other systems reviewed and are negative.  Physical Exam Updated Vital Signs BP (!) 119/53    Pulse 92    Temp 98.9 F (37.2 C)    Resp 19    Ht 1.905 m (6\' 3" )    Wt (!) 171 kg    SpO2 97%    BMI 47.12 kg/m   Physical Exam Vitals and nursing note reviewed.  Constitutional:      Appearance: He is well-developed. He is obese. He is ill-appearing. He is not toxic-appearing.  HENT:     Head: Normocephalic and atraumatic.     Mouth/Throat:     Mouth: Mucous membranes are moist.  Eyes:     Pupils: Pupils are equal, round, and reactive to light.  Cardiovascular:     Rate and Rhythm: Normal rate and regular rhythm.     Heart sounds: Normal heart sounds. No murmur heard. Pulmonary:     Effort: Pulmonary effort is normal. Tachypnea present. No respiratory distress.     Breath sounds: Normal breath sounds. No wheezing.     Comments: Slightly tachypneic, fair air movement with wheezing noted in all lung fields, 4 L oxygen by nasal cannula, speaking in full sentences Abdominal:     General: Bowel sounds are normal.     Palpations: Abdomen is soft.     Tenderness: There is no abdominal tenderness. There is no rebound.  Musculoskeletal:     Cervical back: Neck supple.     Right lower leg: Edema present.     Left lower leg: Edema present.  Lymphadenopathy:     Cervical: No cervical adenopathy.  Skin:    General: Skin is warm and dry.  Neurological:     Mental Status: He is alert and oriented to person, place,  and time.  Psychiatric:        Mood and Affect: Mood normal.    ED Results / Procedures / Treatments   Labs (all labs ordered are listed, but only abnormal results are displayed) Labs Reviewed  RESP PANEL BY RT-PCR (FLU A&B, COVID) ARPGX2 - Abnormal; Notable for the following components:      Result Value   Influenza A by PCR POSITIVE (*)    All other components within normal limits  COMPREHENSIVE METABOLIC PANEL - Abnormal; Notable for the following components:   Chloride 96 (*)    CO2 35 (*)  Glucose, Bld 156 (*)    BUN 25 (*)    Calcium 8.6 (*)    AST 64 (*)    ALT 107 (*)    All other components within normal limits  CBC WITH DIFFERENTIAL/PLATELET - Abnormal; Notable for the following components:   WBC 13.1 (*)    MCHC 29.1 (*)    Neutro Abs 8.7 (*)    Monocytes Absolute 1.5 (*)    Abs Immature Granulocytes 0.18 (*)    All other components within normal limits  I-STAT ARTERIAL BLOOD GAS, ED - Abnormal; Notable for the following components:   pH, Arterial 7.276 (*)    pCO2 arterial 82.6 (*)    pO2, Arterial 70 (*)    Bicarbonate 38.4 (*)    TCO2 41 (*)    Acid-Base Excess 7.0 (*)    All other components within normal limits  BRAIN NATRIURETIC PEPTIDE    EKG None  Radiology DG Chest Portable 1 View  Result Date: 12/24/2021 CLINICAL DATA:  Dyspnea. EXAM: PORTABLE CHEST 1 VIEW COMPARISON:  December 20, 2021 FINDINGS: Multiple overlying cardiac lead wires are seen. The heart size and mediastinal contours are within normal limits. Both lungs are clear. The visualized skeletal structures are unremarkable. IMPRESSION: No active disease. Electronically Signed   By: Aram Candela M.D.   On: 12/24/2021 00:56    Procedures .Critical Care Performed by: Shon Baton, MD Authorized by: Shon Baton, MD   Critical care provider statement:    Critical care time (minutes):  45   Critical care was necessary to treat or prevent imminent or life-threatening  deterioration of the following conditions:  Respiratory failure   Critical care was time spent personally by me on the following activities:  Development of treatment plan with patient or surrogate, discussions with consultants, evaluation of patient's response to treatment, examination of patient, ordering and review of laboratory studies, ordering and review of radiographic studies, ordering and performing treatments and interventions, pulse oximetry, re-evaluation of patient's condition and review of old charts   Medications Ordered in ED Medications  ipratropium-albuterol (DUONEB) 0.5-2.5 (3) MG/3ML nebulizer solution 3 mL (3 mLs Nebulization Given 12/24/21 0044)  dexamethasone (DECADRON) injection 10 mg (10 mg Intravenous Given 12/24/21 0056)  albuterol (PROVENTIL,VENTOLIN) solution continuous neb (15 mg/hr Nebulization Given 12/24/21 0136)  albuterol (PROVENTIL) (2.5 MG/3ML) 0.083% nebulizer solution (15 mg  Given 12/24/21 0135)    ED Course  I have reviewed the triage vital signs and the nursing notes.  Pertinent labs & imaging results that were available during my care of the patient were reviewed by me and considered in my medical decision making (see chart for details).    MDM Rules/Calculators/A&P                          Patient presents today with worsening shortness of breath.  He is somnolent but arousable on my evaluation.  Shortness of breath is likely multifactorial.  History of asthma, obesity hypoventilation, current influenza.  He is wheezing on exam.  Continuous neb ordered.  ABG shows respiratory acidosis with a PCO2 greater than 80.  Will start BiPAP.  Chest x-ray does not show any evidence of pneumothorax or pneumonia.  He is out of the window at this point for Tamiflu.  On recheck, improved on BiPAP.  No lower extremity swelling to suggest acute heart failure.  We will plan for admission for acute on chronic respiratory failure.  Final Clinical Impression(s) /  ED Diagnoses Final diagnoses:  Acute on chronic respiratory failure with hypoxia and hypercapnia (HCC)  Influenza A    Rx / DC Orders ED Discharge Orders     None        Shon Baton, MD 12/24/21 0139

## 2021-12-24 NOTE — ED Notes (Signed)
Patient states he is comfortable without supplemental oxygen,

## 2021-12-24 NOTE — ED Notes (Signed)
Resp called at this time

## 2021-12-24 NOTE — ED Notes (Signed)
Placed BiPap back on, patient verbalized confusion,

## 2021-12-24 NOTE — Progress Notes (Signed)
Patient transported from room 16 to room 2 in the ED without complication.

## 2021-12-24 NOTE — ED Notes (Signed)
Bipap machine alarming entered the room and pt had removed mask not on any O2 via [place] at this time. Resp at bedside. Discussed with pt the importance of leaving the mask and bipap machine on at this time. Pt verbalized understanding

## 2021-12-24 NOTE — H&P (Addendum)
History and Physical    Bryant Saye ZOX:[id number] DOB: 05/28/1971 DOA: 12/23/2021  PCP: Patient, No Pcp Per (Inactive)  Patient coming from: Home.  Chief Complaint: Shortness of breath.  HPI: Joseph Morales is a 50 y.o. male with history of obesity hypoventilation syndrome, asthma, diastolic CHF, hypertension presents to the ER because of increasing shortness of breath over the last 4 to 5 days.  Patient was recently diagnosed with influenza A infection about 4 days ago.  Denies any chest pain productive cough fever or chills.  ED Course: In the ER patient was desaturating with oxygen sats in the 70s.  To be placed on BiPAP.  ABG shows pH of 7.27 with PCO2 of 82.6.  Chest x-ray was unremarkable EKG shows normal sinus rhythm.  Patient was placed on nebulizer steroids and admitted for further management of acute respiratory failure with hypoxia and hypercarbia.  Review of Systems: As per HPI, rest all negative.   Past Medical History:  Diagnosis Date   Asthma    CHF (congestive heart failure) (HCC)    Chronic diastolic heart failure (HCC)    Hypertension    Hypoventilation associated with obesity syndrome (HCC)    Obesity     Past Surgical History:  Procedure Laterality Date   NO PAST SURGERIES       reports that he has been smoking cigarettes. He has a 20.00 pack-year smoking history. He has never used smokeless tobacco. He reports current drug use. Drug: Marijuana. He reports that he does not drink alcohol.  No Known Allergies  Family History  Problem Relation Age of Onset   Breast cancer Mother    Diabetes Mother    Emphysema Maternal Grandmother    Congestive Heart Failure Maternal Grandmother     Prior to Admission medications   Medication Sig Start Date End Date Taking? Authorizing Provider  albuterol (PROVENTIL) (5 MG/ML) 0.5% nebulizer solution Take 0.5 mLs (2.5 mg total) by nebulization every 4 (four) hours as needed for wheezing or shortness of breath. 12/20/21    Bethann Berkshire, MD  budesonide-formoterol (SYMBICORT) 160-4.5 MCG/ACT inhaler INHALE 2 PUFFS INTO THE LUNGS IN THE MORNING AND AT BEDTIME. 06/18/21 06/18/22  Glenford Bayley, NP  carvedilol (COREG) 6.25 MG tablet Take 6.25 mg by mouth 2 (two) times daily. 04/23/21   [provider]  doxycycline (VIBRAMYCIN) 100 MG capsule Take 1 capsule (100 mg total) by mouth 2 (two) times daily. 12/20/21   Bethann Berkshire, MD  furosemide (LASIX) 80 MG tablet Take 1 tablet (80 mg total) by mouth 2 (two) times daily. 05/21/21 05/21/22  Simonne Martinet, NP  losartan (COZAAR) 50 MG tablet Take 50 mg by mouth daily. 04/23/21   [provider]  OXYGEN Inhale into the lungs at bedtime.    [provider]  potassium chloride SA (KLOR-CON) 20 MEQ tablet Take 1 tablet (20 mEq total) by mouth daily. 05/21/21   Simonne Martinet, NP  predniSONE (DELTASONE) 10 MG tablet Take 2 tablets (20 mg total) by mouth daily. 12/20/21   Bethann Berkshire, MD  Tiotropium Bromide Monohydrate (SPIRIVA RESPIMAT) 1.25 MCG/ACT AERS Inhale 1 puff into the lungs daily. 05/21/21   Simonne Martinet, NP    Physical Exam: Constitutional: Moderately built and nourished. Vitals:   12/24/21 0045 12/24/21 0103 12/24/21 0138 12/24/21 0322  BP: 123/62 (!) 119/53  (!) 161/88  Pulse:  92  85  Resp: Temp:      SpO2: 96% 97% 99%  99%  Weight:      Height:       Eyes: Anicteric no pallor. ENMT: No discharge from the ears eyes nose and mouth. Neck: No mass felt.  No neck rigidity.  No JVD appreciated. Respiratory: Bilateral expiratory wheeze and no crepitations. Cardiovascular: S1-S2 heard. Abdomen: Soft nontender bowel sound present. Musculoskeletal: Mild edema of the both lower extremities. Skin: No rash. Neurologic: Alert awake oriented time place and person.  Moves all extremities. Psychiatric: Appears normal.  Normal affect.   Labs on Admission: I have personally reviewed following labs and imaging  studies  CBC: Recent Labs  Lab 12/20/21 1335 12/23/21 2315 12/24/21 0117  WBC 11.1* 13.1*  --   NEUTROABS 9.0* 8.7*  --   HGB 15.0 15.0 16.7  HCT 47.9 51.5 49.0  MCV 93.7 99.2  --   PLT 223 238  --    Basic Metabolic Panel: Recent Labs  Lab 12/20/21 1335 12/23/21 2315 12/24/21 0117  NA 132* 137 138  K 4.9 4.9 4.8  CL 94* 96*  --   CO2 32 35*  --   GLUCOSE 201* 156*  --   BUN 17 25*  --   CREATININE 1.18 1.07  --   CALCIUM 8.8* 8.6*  --    GFR: Estimated Creatinine Clearance: 142.2 mL/min (by C-G formula based on SCr of 1.07 mg/dL). Liver Function Tests: Recent Labs  Lab 12/20/21 1335 12/23/21 2315  AST 64* 64*  ALT 90* 107*  ALKPHOS 67 69  BILITOT 1.5* 1.2  PROT 7.9 7.2  ALBUMIN 4.0 3.7   No results for input(s): LIPASE, AMYLASE in the last 168 hours. No results for input(s): AMMONIA in the last 168 hours. Coagulation Profile: No results for input(s): INR, PROTIME in the last 168 hours. Cardiac Enzymes: No results for input(s): CKTOTAL, CKMB, CKMBINDEX, TROPONINI in the last 168 hours. BNP (last 3 results) Recent Labs    06/18/21 1619  PROBNP 16.0   HbA1C: No results for input(s): HGBA1C in the last 72 hours. CBG: No results for input(s): GLUCAP in the last 168 hours. Lipid Profile: No results for input(s): CHOL, HDL, LDLCALC, TRIG, CHOLHDL, LDLDIRECT in the last 72 hours. Thyroid Function Tests: No results for input(s): TSH, T4TOTAL, FREET4, T3FREE, THYROIDAB in the last 72 hours. Anemia Panel: No results for input(s): VITAMINB12, FOLATE, FERRITIN, TIBC, IRON, RETICCTPCT in the last 72 hours. Urine analysis:    Component Value Date/Time   COLORURINE YELLOW 10/17/2020 1425   APPEARANCEUR CLEAR 10/17/2020 1425   LABSPEC 1.017 10/17/2020 1425   PHURINE 5.0 10/17/2020 1425   GLUCOSEU NEGATIVE 10/17/2020 1425   HGBUR NEGATIVE 10/17/2020 1425   BILIRUBINUR NEGATIVE 10/17/2020 1425   KETONESUR NEGATIVE 10/17/2020 1425   PROTEINUR NEGATIVE  10/17/2020 1425   NITRITE NEGATIVE 10/17/2020 1425   LEUKOCYTESUR NEGATIVE 10/17/2020 1425   Sepsis Labs: @LABRCNTIP (procalcitonin:4,lacticidven:4) ) Recent Results (from the past 240 hour(s))  Resp Panel by RT-PCR (Flu A&B, Covid) Nasopharyngeal Swab     Status: Abnormal   Collection Time: 12/20/21  1:12 PM   Specimen: Nasopharyngeal Swab; Nasopharyngeal(NP) swabs in vial transport medium  Result Value Ref Range Status   SARS Coronavirus 2 by RT PCR NEGATIVE NEGATIVE Final    Comment: (NOTE) SARS-CoV-2 target nucleic acids are NOT DETECTED.  The SARS-CoV-2 RNA is generally detectable in upper respiratory specimens during the acute phase of infection. The lowest concentration of SARS-CoV-2 viral copies this assay can detect is 138 copies/mL. A negative result does not preclude SARS-Cov-2  infection and should not be used as the sole basis for treatment or other patient management decisions. A negative result may occur with  improper specimen collection/handling, submission of specimen other than nasopharyngeal swab, presence of viral mutation(s) within the areas targeted by this assay, and inadequate number of viral copies(<138 copies/mL). A negative result must be combined with clinical observations, patient history, and epidemiological information. The expected result is Negative.  Fact Sheet for Patients:  BloggerCourse.com  Fact Sheet for Healthcare Providers:  SeriousBroker.it  This test is no t yet approved or cleared by the [place] FDA and  has been authorized for detection and/or diagnosis of SARS-CoV-2 by FDA under an Emergency Use Authorization (EUA). This EUA will remain  in effect (meaning this test can be used) for the duration of the COVID-19 declaration under Section 564(b)(1) of the Act, 21 U.S.C.section 360bbb-3(b)(1), unless the authorization is terminated  or revoked sooner.       Influenza A by PCR  POSITIVE (A) NEGATIVE Final   Influenza B by PCR NEGATIVE NEGATIVE Final    Comment: (NOTE) The Xpert Xpress SARS-CoV-2/FLU/RSV plus assay is intended as an aid in the diagnosis of influenza from Nasopharyngeal swab specimens and should not be used as a sole basis for treatment. Nasal washings and aspirates are unacceptable for Xpert Xpress SARS-CoV-2/FLU/RSV testing.  Fact Sheet for Patients: BloggerCourse.com  Fact Sheet for Healthcare Providers: SeriousBroker.it  This test is not yet approved or cleared by the [place] FDA and has been authorized for detection and/or diagnosis of SARS-CoV-2 by FDA under an Emergency Use Authorization (EUA). This EUA will remain in effect (meaning this test can be used) for the duration of the COVID-19 declaration under Section 564(b)(1) of the Act, 21 U.S.C. section 360bbb-3(b)(1), unless the authorization is terminated or revoked.  Performed at [facility], [address], [place], [place] [address]   Resp Panel by RT-PCR (Flu A&B, Covid) Nasopharyngeal Swab     Status: Abnormal   Collection Time: 12/23/21 11:10 PM   Specimen: Nasopharyngeal Swab; Nasopharyngeal(NP) swabs in vial transport medium  Result Value Ref Range Status   SARS Coronavirus 2 by RT PCR NEGATIVE NEGATIVE Final    Comment: (NOTE) SARS-CoV-2 target nucleic acids are NOT DETECTED.  The SARS-CoV-2 RNA is generally detectable in upper respiratory specimens during the acute phase of infection. The lowest concentration of SARS-CoV-2 viral copies this assay can detect is 138 copies/mL. A negative result does not preclude SARS-Cov-2 infection and should not be used as the sole basis for treatment or other patient management decisions. A negative result may occur with  improper specimen collection/handling, submission of specimen other than nasopharyngeal swab, presence of viral mutation(s) within  the areas targeted by this assay, and inadequate number of viral copies(<138 copies/mL). A negative result must be combined with clinical observations, patient history, and epidemiological information. The expected result is Negative.  Fact Sheet for Patients:  BloggerCourse.com  Fact Sheet for Healthcare Providers:  SeriousBroker.it  This test is no t yet approved or cleared by the [place] FDA and  has been authorized for detection and/or diagnosis of SARS-CoV-2 by FDA under an Emergency Use Authorization (EUA). This EUA will remain  in effect (meaning this test can be used) for the duration of the COVID-19 declaration under Section 564(b)(1) of the Act, 21 U.S.C.section 360bbb-3(b)(1), unless the authorization is terminated  or revoked sooner.       Influenza A by PCR POSITIVE (A) NEGATIVE Final  Influenza B by PCR NEGATIVE NEGATIVE Final    Comment: (NOTE) The Xpert Xpress SARS-CoV-2/FLU/RSV plus assay is intended as an aid in the diagnosis of influenza from Nasopharyngeal swab specimens and should not be used as a sole basis for treatment. Nasal washings and aspirates are unacceptable for Xpert Xpress SARS-CoV-2/FLU/RSV testing.  Fact Sheet for Patients: BloggerCourse.com  Fact Sheet for Healthcare Providers: SeriousBroker.it  This test is not yet approved or cleared by the [place] FDA and has been authorized for detection and/or diagnosis of SARS-CoV-2 by FDA under an Emergency Use Authorization (EUA). This EUA will remain in effect (meaning this test can be used) for the duration of the COVID-19 declaration under Section 564(b)(1) of the Act, 21 U.S.C. section 360bbb-3(b)(1), unless the authorization is terminated or revoked.  Performed at [facility], [address] [place], [place] [address]      Radiological Exams on Admission: DG Chest  Portable 1 View  Result Date: 12/24/2021 CLINICAL DATA:  Dyspnea. EXAM: PORTABLE CHEST 1 VIEW COMPARISON:  December 20, 2021 FINDINGS: Multiple overlying cardiac lead wires are seen. The heart size and mediastinal contours are within normal limits. Both lungs are clear. The visualized skeletal structures are unremarkable. IMPRESSION: No active disease. Electronically Signed   By: Aram Candela M.D.   On: 12/24/2021 00:56    EKG: Independently reviewed.  Normal sinus rhythm.  Assessment/Plan Principal Problem:   Acute respiratory failure with hypoxia and hypercarbia (HCC) Active Problems:   Asthma   Chronic diastolic heart failure (HCC)   Obesity hypoventilation syndrome (HCC)   Influenza A    Acute respiratory failure with hypoxia and hypercarbia secondary to decompensation of obesity hypoventilation and asthma exacerbation.  Continue with BiPAP and repeat ABG in the morning.  Continue with nebulizer steroids and Pulmicort. Influenza A diagnosed about 4 days ago.  We will keep patient on Tamiflu. Chronic diastolic CHF last EF measured was 70 to 75% in June 2021.  On 80 mg Lasix twice daily. History of hypertension on ARB and beta-blockers.  We will add as needed IV hydralazine since blood pressure is around 160 systolic. Elevated LFTs appears to be chronic.  Follow LFTs.  May check acute hepatitis panel with next blood draw.   DVT prophylaxis: Lovenox. Code Status: Full code. Family Communication: Discussed with patient. Disposition Plan: Home. Consults called: None. Admission status: Observation.   Eduard Clos MD [facility] Pager [phone].  If 7PM-7AM, please contact night-coverage [email or web address] Password [facility] [place]  12/24/2021, 3:44 AM

## 2021-12-25 LAB — GLUCOSE, CAPILLARY
Glucose-Capillary: 264 mg/dL — ABNORMAL HIGH (ref 70–99)
Glucose-Capillary: 229 mg/dL — ABNORMAL HIGH (ref 70–99)
Glucose-Capillary: 171 mg/dL — ABNORMAL HIGH (ref 70–99)

## 2021-12-25 LAB — CBC
HCT: 43.3 % (ref 39.0–52.0)
Hemoglobin: 13.4 g/dL (ref 13.0–17.0)
MCH: 28.9 pg (ref 26.0–34.0)
MCHC: 30.9 g/dL (ref 30.0–36.0)
MCV: 93.5 fL (ref 80.0–100.0)
Platelets: 242 10*3/uL (ref 150–400)
RBC: 4.63 MIL/uL (ref 4.22–5.81)
RDW: 12.6 % (ref 11.5–15.5)
WBC: 21.2 10*3/uL — ABNORMAL HIGH (ref 4.0–10.5)
nRBC: 0 % (ref 0.0–0.2)

## 2021-12-25 LAB — BASIC METABOLIC PANEL
Anion gap: 8 (ref 5–15)
BUN: 25 mg/dL — ABNORMAL HIGH (ref 6–20)
CO2: 34 mmol/L — ABNORMAL HIGH (ref 22–32)
Calcium: 8.8 mg/dL — ABNORMAL LOW (ref 8.9–10.3)
Chloride: 92 mmol/L — ABNORMAL LOW (ref 98–111)
Creatinine, Ser: 0.7 mg/dL (ref 0.61–1.24)
GFR, Estimated: >60 mL/min (ref >60)
Glucose, Bld: 146 mg/dL — ABNORMAL HIGH (ref 70–99)
Potassium: 4.2 mmol/L (ref 3.5–5.1)
Sodium: 134 mmol/L — ABNORMAL LOW (ref 135–145)

## 2021-12-25 MED ORDER — METHYLPREDNISOLONE SODIUM SUCC 40 MG IJ SOLR
40.0000 mg | INTRAMUSCULAR | Status: DC
  Administered 2021-12-26: 05:00:00 40 mg via INTRAVENOUS
  Filled 2021-12-25: qty 1

## 2021-12-25 MED ORDER — IPRATROPIUM-ALBUTEROL 0.5-2.5 (3) MG/3ML IN SOLN
3.0000 mL | Freq: Four times a day (QID) | RESPIRATORY_TRACT | Status: DC
  Administered 2021-12-25: 08:00:00 3 mL via RESPIRATORY_TRACT
  Filled 2021-12-25: qty 3

## 2021-12-25 MED ORDER — IPRATROPIUM-ALBUTEROL 0.5-2.5 (3) MG/3ML IN SOLN
3.0000 mL | Freq: Two times a day (BID) | RESPIRATORY_TRACT | Status: DC
  Administered 2021-12-25 – 2021-12-26 (×2): 3 mL via RESPIRATORY_TRACT
  Filled 2021-12-25 (×2): qty 3

## 2021-12-25 MED ORDER — IPRATROPIUM-ALBUTEROL 0.5-2.5 (3) MG/3ML IN SOLN: 3.0000 mL | Freq: Two times a day (BID) | RESPIRATORY_TRACT | Status: DC

## 2021-12-25 NOTE — TOC Initial Note (Signed)
Transition of Care [facility]) - Initial/Assessment Note    Patient Details  Name: Joseph Morales MRN: [id number] Date of Birth: [birth year]  Transition of Care [facility] CM/SW Contact:    Harriet Masson, RN Phone Number: 12/25/2021, 2:49 PM  Clinical Narrative:                 Spoke to patient regarding transition needs. Orders for DME nebulizer. Patient already has home 02 with [employer]. Spoke to [place] with [employer] and nebulizer will be sent to the room.Address, Phone number and PCP verified. Mother can transport patient home once discharged. TOC will continue to follow  Expected Discharge Plan: Home/Self Care Barriers to Discharge: Continued Medical Work up   Patient Goals and CMS Choice Patient states their goals for this hospitalization and ongoing recovery are:: return home      Expected Discharge Plan and Services Expected Discharge Plan: Home/Self Care   Discharge Planning Services: CM Consult   Living arrangements for the past 2 months: Single Family Home                                      Prior Living Arrangements/Services Living arrangements for the past 2 months: Single Family Home   Patient language and need for interpreter reviewed:: Yes Do you feel safe going back to the place where you live?: Yes      Need for Family Participation in Patient Care: Yes (Comment) Care giver support system in place?: Yes (comment) Current home services: DME (home 02) Criminal Activity/Legal Involvement Pertinent to Current Situation/Hospitalization: No - Comment as needed  Activities of Daily Living Home Assistive Devices/Equipment: CPAP, Oxygen ADL Screening (condition at time of admission) Patient's cognitive ability adequate to safely complete daily activities?: Yes Is the patient deaf or have difficulty hearing?: No Does the patient have difficulty seeing, even when wearing glasses/contacts?: No Does the patient have difficulty concentrating, remembering, or making  decisions?: No Patient able to express need for assistance with ADLs?: No Does the patient have difficulty dressing or bathing?: No Independently performs ADLs?: Yes (appropriate for developmental age) Does the patient have difficulty walking or climbing stairs?: No Weakness of Legs: None Weakness of Arms/Hands: None  Permission Sought/Granted Permission sought to share information with : Facility Engineer, maintenance (IT) granted to share info w AGENCY: DME        Emotional Assessment Appearance:: Appears stated age Attitude/Demeanor/Rapport: Engaged Affect (typically observed): Accepting Orientation: : Oriented to Self, Oriented to Place, Oriented to  Time, Oriented to Situation Alcohol / Substance Use: Not Applicable Psych Involvement: No (comment)  Admission diagnosis:  Influenza A [J10.1] Acute respiratory failure with hypoxia and hypercarbia (HCC) [J96.01, J96.02] Acute respiratory failure with hypoxia and hypercapnia (HCC) [J96.01, J96.02] Acute on chronic respiratory failure with hypoxia and hypercapnia (HCC) [O17.51, J96.22] Patient Active Problem List   Diagnosis Date Noted   Acute respiratory failure with hypoxia and hypercarbia (HCC) 12/24/2021   Influenza A 12/24/2021   Acute respiratory failure with hypoxia and hypercapnia (HCC) 12/24/2021   Hypercapnic respiratory failure (HCC) 05/18/2021   Erectile dysfunction 02/22/2021   Tobacco abuse 10/24/2020   Chronic respiratory failure with hypoxia and hypercapnia (HCC) 10/16/2020   Essential hypertension 10/16/2020   Hyperlipidemia 10/16/2020   Asthma 06/21/2020   Chronic diastolic heart failure (HCC) 06/21/2020   Orthopnea 06/21/2020   Chronic venous insufficiency 06/21/2020   Cellulitis 06/21/2020  Obesity, Class III, BMI 40-49.9 (morbid obesity) (HCC) 06/21/2020   Obesity hypoventilation syndrome (HCC) 06/21/2020   Leucocytosis 06/21/2020   Chronic respiratory alkalosis 06/21/2020    Hypertensive urgency 06/21/2020   Calf tenderness 06/21/2020   PCP:  Hollowell, Sidonie Dickens, MD Pharmacy:   [facility] [phone] - [address]  - [address] AT [facility] [address] [place] [address] Phone: [phone] Fax: [phone]  [facility] - [place], [place] - [address] [place] [address] Phone: [phone] Fax: [phone]     Social Determinants of Health (SDOH) Interventions    Readmission Risk Interventions No flowsheet data found.

## 2021-12-25 NOTE — Plan of Care (Signed)

## 2021-12-25 NOTE — Progress Notes (Signed)
Progress Note    Joseph Morales  ZOX:[id number] DOB: 06-09-1971  DOA: 12/23/2021 PCP: Patient, No Pcp Per (Inactive)    Brief Narrative:     Medical records reviewed and are as summarized below:  Joseph Morales is an 50 y.o. male with history of obesity hypoventilation syndrome, asthma, diastolic CHF, hypertension presents to the ER because of increasing shortness of breath over the last 4 to 5 days.  Patient was recently diagnosed with influenza A infection about 4 days ago.    Assessment/Plan:   Principal Problem:   Acute respiratory failure with hypoxia and hypercarbia (HCC) Active Problems:   Asthma   Chronic diastolic heart failure (HCC)   Obesity hypoventilation syndrome (HCC)   Influenza A   Acute respiratory failure with hypoxia and hypercapnia (HCC)   Acute respiratory failure with hypoxia and hypercarbia secondary to decompensation of obesity hypoventilation and asthma exacerbation.   -wean IV steroids -close to home O2 levels -will place DME for nebulizer.  Influenza A   Chronic diastolic CHF last EF measured was 70 to 75% in June 2021.   -On 80 mg Lasix twice daily.  History of hypertension  -on ARB and beta-blockers.  Elevated LFTs appears to be chronic -outpatient follow up  obesity Body mass index is 46.54 kg/m.   Family Communication/Anticipated D/C date and plan/Code Status   DVT prophylaxis: Lovenox ordered. Code Status: Full Code.  Disposition Plan: Status is: Inpatient  Remains inpatient appropriate because: home in AM         Medical Consultants:   None.    Subjective:   Thinks his breathing is fine -on [place] at home PRN and has CPAP QHS  Objective:    Vitals:   12/25/21 0500 12/25/21 0746 12/25/21 0803 12/25/21 1143  BP:   129/63 119/89  Pulse:   85 90  Resp:   17 17  Temp:   (!) 97.4 F (36.3 C) 97.8 F (36.6 C)  TempSrc:   Oral Oral  SpO2:  90% 90% 96%  Weight: (!) 168.9 kg     Height:        Intake/Output  Summary (Last 24 hours) at 12/25/2021 1355 Last data filed at 12/25/2021 0957 Gross per 24 hour  Intake --  Output 1850 ml  Net -1850 ml   Filed Weights   12/23/21 2244 12/25/21 0500  Weight: (!) 171 kg (!) 168.9 kg    Exam:  General: Appearance:    Obese male in no acute distress     Lungs:     respirations unlabored, no wheezing  Heart:    Normal heart rate.    MS:   All extremities are intact.    Neurologic:   Awake, alert, oriented x 3. No apparent focal neurological           defect. Poor eye contact     Data Reviewed:   I have personally reviewed following labs and imaging studies:  Labs: Labs show the following:   Basic Metabolic Panel: Recent Labs  Lab 12/20/21 1335 12/23/21 2315 12/24/21 0117 12/24/21 0343 12/24/21 0909 12/25/21 0053  NA 132* 137 138 136 137 134*  K 4.9 4.9 4.8 6.1* 5.4* 4.2  CL 94* 96*  --  97*  --  92*  CO2 32 35*  --  35*  --  34*  GLUCOSE 201* 156*  --  168*  --  146*  BUN 17 25*  --  26*  --  25*  CREATININE 1.18  1.07  --  1.12  --  0.70  CALCIUM 8.8* 8.6*  --  8.5*  --  8.8*   GFR Estimated Creatinine Clearance: 189 mL/min (by C-G formula based on SCr of 0.7 mg/dL). Liver Function Tests: Recent Labs  Lab 12/20/21 1335 12/23/21 2315  AST 64* 64*  ALT 90* 107*  ALKPHOS 67 69  BILITOT 1.5* 1.2  PROT 7.9 7.2  ALBUMIN 4.0 3.7   No results for input(s): LIPASE, AMYLASE in the last 168 hours. No results for input(s): AMMONIA in the last 168 hours. Coagulation profile No results for input(s): INR, PROTIME in the last 168 hours.  CBC: Recent Labs  Lab 12/20/21 1335 12/23/21 2315 12/24/21 0117 12/24/21 0343 12/24/21 0909 12/25/21 0053  WBC 11.1* 13.1*  --  11.4*  --  21.2*  NEUTROABS 9.0* 8.7*  --   --   --   --   HGB 15.0 15.0 16.7 15.1 16.7 13.4  HCT 47.9 51.5 49.0 51.1 49.0 43.3  MCV 93.7 99.2  --  98.8  --  93.5  PLT 223 238  --  244  --  242   Cardiac Enzymes: No results for input(s): CKTOTAL, CKMB,  CKMBINDEX, TROPONINI in the last 168 hours. BNP (last 3 results) Recent Labs    06/18/21 1619  PROBNP 16.0   CBG: Recent Labs  Lab 12/24/21 1801 12/25/21 0807 12/25/21 1148  GLUCAP 108* 264* 229*   D-Dimer: No results for input(s): DDIMER in the last 72 hours. Hgb A1c: No results for input(s): HGBA1C in the last 72 hours. Lipid Profile: No results for input(s): CHOL, HDL, LDLCALC, TRIG, CHOLHDL, LDLDIRECT in the last 72 hours. Thyroid function studies: No results for input(s): TSH, T4TOTAL, T3FREE, THYROIDAB in the last 72 hours.  Invalid input(s): FREET3 Anemia work up: No results for input(s): VITAMINB12, FOLATE, FERRITIN, TIBC, IRON, RETICCTPCT in the last 72 hours. Sepsis Labs: Recent Labs  Lab 12/20/21 1335 12/23/21 2315 12/24/21 0343 12/25/21 0053  WBC 11.1* 13.1* 11.4* 21.2*    Microbiology Recent Results (from the past 240 hour(s))  Resp Panel by RT-PCR (Flu A&B, Covid) Nasopharyngeal Swab     Status: Abnormal   Collection Time: 12/20/21  1:12 PM   Specimen: Nasopharyngeal Swab; Nasopharyngeal(NP) swabs in vial transport medium  Result Value Ref Range Status   SARS Coronavirus 2 by RT PCR NEGATIVE NEGATIVE Final    Comment: (NOTE) SARS-CoV-2 target nucleic acids are NOT DETECTED.  The SARS-CoV-2 RNA is generally detectable in upper respiratory specimens during the acute phase of infection. The lowest concentration of SARS-CoV-2 viral copies this assay can detect is 138 copies/mL. A negative result does not preclude SARS-Cov-2 infection and should not be used as the sole basis for treatment or other patient management decisions. A negative result may occur with  improper specimen collection/handling, submission of specimen other than nasopharyngeal swab, presence of viral mutation(s) within the areas targeted by this assay, and inadequate number of viral copies(<138 copies/mL). A negative result must be combined with clinical observations, patient  history, and epidemiological information. The expected result is Negative.  Fact Sheet for Patients:  BloggerCourse.com  Fact Sheet for Healthcare Providers:  SeriousBroker.it  This test is no t yet approved or cleared by the [place] FDA and  has been authorized for detection and/or diagnosis of SARS-CoV-2 by FDA under an Emergency Use Authorization (EUA). This EUA will remain  in effect (meaning this test can be used) for the duration of the COVID-19 declaration under  Section 564(b)(1) of the Act, 21 U.S.C.section 360bbb-3(b)(1), unless the authorization is terminated  or revoked sooner.       Influenza A by PCR POSITIVE (A) NEGATIVE Final   Influenza B by PCR NEGATIVE NEGATIVE Final    Comment: (NOTE) The Xpert Xpress SARS-CoV-2/FLU/RSV plus assay is intended as an aid in the diagnosis of influenza from Nasopharyngeal swab specimens and should not be used as a sole basis for treatment. Nasal washings and aspirates are unacceptable for Xpert Xpress SARS-CoV-2/FLU/RSV testing.  Fact Sheet for Patients: BloggerCourse.com  Fact Sheet for Healthcare Providers: SeriousBroker.it  This test is not yet approved or cleared by the [place] FDA and has been authorized for detection and/or diagnosis of SARS-CoV-2 by FDA under an Emergency Use Authorization (EUA). This EUA will remain in effect (meaning this test can be used) for the duration of the COVID-19 declaration under Section 564(b)(1) of the Act, 21 U.S.C. section 360bbb-3(b)(1), unless the authorization is terminated or revoked.  Performed at [facility], [address], [place], [place] [address]   Resp Panel by RT-PCR (Flu A&B, Covid) Nasopharyngeal Swab     Status: Abnormal   Collection Time: 12/23/21 11:10 PM   Specimen: Nasopharyngeal Swab; Nasopharyngeal(NP) swabs in vial transport medium   Result Value Ref Range Status   SARS Coronavirus 2 by RT PCR NEGATIVE NEGATIVE Final    Comment: (NOTE) SARS-CoV-2 target nucleic acids are NOT DETECTED.  The SARS-CoV-2 RNA is generally detectable in upper respiratory specimens during the acute phase of infection. The lowest concentration of SARS-CoV-2 viral copies this assay can detect is 138 copies/mL. A negative result does not preclude SARS-Cov-2 infection and should not be used as the sole basis for treatment or other patient management decisions. A negative result may occur with  improper specimen collection/handling, submission of specimen other than nasopharyngeal swab, presence of viral mutation(s) within the areas targeted by this assay, and inadequate number of viral copies(<138 copies/mL). A negative result must be combined with clinical observations, patient history, and epidemiological information. The expected result is Negative.  Fact Sheet for Patients:  BloggerCourse.com  Fact Sheet for Healthcare Providers:  SeriousBroker.it  This test is no t yet approved or cleared by the [place] FDA and  has been authorized for detection and/or diagnosis of SARS-CoV-2 by FDA under an Emergency Use Authorization (EUA). This EUA will remain  in effect (meaning this test can be used) for the duration of the COVID-19 declaration under Section 564(b)(1) of the Act, 21 U.S.C.section 360bbb-3(b)(1), unless the authorization is terminated  or revoked sooner.       Influenza A by PCR POSITIVE (A) NEGATIVE Final   Influenza B by PCR NEGATIVE NEGATIVE Final    Comment: (NOTE) The Xpert Xpress SARS-CoV-2/FLU/RSV plus assay is intended as an aid in the diagnosis of influenza from Nasopharyngeal swab specimens and should not be used as a sole basis for treatment. Nasal washings and aspirates are unacceptable for Xpert Xpress SARS-CoV-2/FLU/RSV testing.  Fact Sheet for  Patients: BloggerCourse.com  Fact Sheet for Healthcare Providers: SeriousBroker.it  This test is not yet approved or cleared by the [place] FDA and has been authorized for detection and/or diagnosis of SARS-CoV-2 by FDA under an Emergency Use Authorization (EUA). This EUA will remain in effect (meaning this test can be used) for the duration of the COVID-19 declaration under Section 564(b)(1) of the Act, 21 U.S.C. section 360bbb-3(b)(1), unless the authorization is terminated or revoked.  Performed at [facility], [address], [place], [place] [address]     Procedures and diagnostic studies:  DG Chest Portable 1 View  Result Date: 12/24/2021 CLINICAL DATA:  Dyspnea. EXAM: PORTABLE CHEST 1 VIEW COMPARISON:  December 20, 2021 FINDINGS: Multiple overlying cardiac lead wires are seen. The heart size and mediastinal contours are within normal limits. Both lungs are clear. The visualized skeletal structures are unremarkable. IMPRESSION: No active disease. Electronically Signed   By: Aram Candela M.D.   On: 12/24/2021 00:56    Medications:    budesonide (PULMICORT) nebulizer solution  0.25 mg Nebulization BID   buPROPion  300 mg Oral Daily   carvedilol  6.25 mg Oral BID WC   enoxaparin (LOVENOX) injection  80 mg Subcutaneous Q24H   furosemide  80 mg Oral BID   influenza vac split quadrivalent PF  0.5 mL Intramuscular Tomorrow-1000   ipratropium-albuterol  3 mL Nebulization BID   losartan  50 mg Oral Daily   [START ON 12/26/2021] methylPREDNISolone (SOLU-MEDROL) injection  40 mg Intravenous Q24H   pneumococcal 23 valent vaccine  0.5 mL Intramuscular Tomorrow-1000   Continuous Infusions:   LOS: 1 day   Joseph Art  [facility]   How to contact the [facility] Attending or Consulting provider 7A - 7P or covering provider during after hours 7P -7A, for this patient?  Check the care team in [facility] and look for a)  attending/consulting [facility] provider listed and b) the [facility] team listed Log into [email or web address] and use [place]'s universal password to access. If you do not have the password, please contact the hospital operator. Locate the [facility] provider you are looking for under [facility] and page to a number that you can be directly reached. If you still have difficulty reaching the provider, please page the [facility] (Director on Call) for the Hospitalists listed on amion for assistance.  12/25/2021, 1:55 PM

## 2021-12-26 ENCOUNTER — Other Ambulatory Visit (HOSPITAL_COMMUNITY): Payer: Self-pay

## 2021-12-26 LAB — GLUCOSE, CAPILLARY: Glucose-Capillary: 306 mg/dL — ABNORMAL HIGH (ref 70–99)

## 2021-12-26 MED ORDER — ALBUTEROL SULFATE (2.5 MG/3ML) 0.083% IN NEBU
2.5000 mg | INHALATION_SOLUTION | RESPIRATORY_TRACT | 0 refills | Status: DC | PRN
  Filled 2021-12-26: qty 90, 3d supply, fill #0

## 2021-12-26 MED ORDER — PREDNISONE 10 MG PO TABS
ORAL_TABLET | ORAL | 0 refills | Status: DC
  Filled 2021-12-26: qty 20, 8d supply, fill #0

## 2021-12-26 NOTE — Plan of Care (Signed)
  Problem: Education: Goal: Knowledge of General Education information will improve Description: Including pain rating scale, medication(s)/side effects and non-pharmacologic comfort measures Outcome: Adequate for Discharge   Problem: Health Behavior/Discharge Planning: Goal: Ability to manage health-related needs will improve Outcome: Adequate for Discharge   Problem: Clinical Measurements: Goal: Ability to maintain clinical measurements within normal limits will improve Outcome: Adequate for Discharge Goal: Will remain free from infection Outcome: Adequate for Discharge Goal: Diagnostic test results will improve Outcome: Adequate for Discharge Goal: Respiratory complications will improve Outcome: Adequate for Discharge Goal: Cardiovascular complication will be avoided Outcome: Adequate for Discharge   Problem: Activity: Goal: Risk for activity intolerance will decrease Outcome: Adequate for Discharge   Problem: Nutrition: Goal: Adequate nutrition will be maintained Outcome: Adequate for Discharge   Problem: Coping: Goal: Level of anxiety will decrease Outcome: Adequate for Discharge   Problem: Elimination: Goal: Will not experience complications related to bowel motility Outcome: Adequate for Discharge Goal: Will not experience complications related to urinary retention Outcome: Adequate for Discharge   Problem: Pain Managment: Goal: General experience of comfort will improve Outcome: Adequate for Discharge   Problem: Safety: Goal: Ability to remain free from injury will improve Outcome: Adequate for Discharge   Problem: Skin Integrity: Goal: Risk for impaired skin integrity will decrease Outcome: Adequate for Discharge   Problem: Skin Integrity: Goal: Risk for impaired skin integrity will decrease Outcome: Adequate for Discharge   

## 2021-12-26 NOTE — Discharge Summary (Signed)
Physician Discharge Summary  Joseph Morales KJZ:[id number] DOB: Oct 27, 1971 DOA: 12/23/2021  PCP: Joseph Earing, MD  Admit date: 12/23/2021 Discharge date: 12/26/2021  Admitted From: home Discharge disposition: home   Recommendations for Outpatient Follow-Up:   Encouraged compliance with meds and O2   Discharge Diagnosis:   Principal Problem:   Acute respiratory failure with hypoxia and hypercarbia (HCC) Active Problems:   Asthma   Chronic diastolic heart failure (HCC)   Obesity hypoventilation syndrome (HCC)   Influenza A   Acute respiratory failure with hypoxia and hypercapnia (HCC)    Discharge Condition: Improved.  Diet recommendation: Low sodium, heart healthy.  Carbohydrate-modified  Wound care: None.  Code status: Full.   History of Present Illness:   Joseph Morales is a 50 y.o. male with history of obesity hypoventilation syndrome, asthma, diastolic CHF, hypertension presents to the ER because of increasing shortness of breath over the last 4 to 5 days.  Patient was recently diagnosed with influenza A infection about 4 days ago.  Denies any chest pain productive cough fever or chills.   ED Course: In the ER patient was desaturating with oxygen sats in the 70s.  To be placed on BiPAP.  ABG shows pH of 7.27 with PCO2 of 82.6.  Chest x-ray was unremarkable EKG shows normal sinus rhythm.  Patient was placed on nebulizer steroids and admitted for further management of acute respiratory failure with hypoxia and hypercarbia.   Hospital Course by Problem:   Acute respiratory failure with hypoxia and hypercarbia secondary to decompensation of obesity hypoventilation and asthma exacerbation.   -wean IV steroids -close to home O2 levels -will place DME for nebulizer.   Influenza A  -past window treatment   Chronic diastolic CHF last EF measured was 70 to 75% in June 2021.   -On 80 mg Lasix twice daily.   History of hypertension  -on ARB and  beta-blockers.   Elevated LFTs appears to be chronic -outpatient follow up   obesity Body mass index is 46.54 kg/m.    Medical Consultants:      Discharge Exam:   Vitals:   12/26/21 0445 12/26/21 0755  BP:  127/63  Pulse:  95  Resp:  17  Temp:  98 F (36.7 C)  SpO2: 94% 93%   Vitals:   12/26/21 0402 12/26/21 0445 12/26/21 0500 12/26/21 0755  BP: 114/69   127/63  Pulse: 81   95  Resp: 18   17  Temp: (!) 97.5 F (36.4 C)   98 F (36.7 C)  TempSrc: Axillary   Oral  SpO2: 97% 94%  93%  Weight:   (!) 164.2 kg   Height:        General exam: Appears calm and comfortable. The results of significant diagnostics from this hospitalization (including imaging, microbiology, ancillary and laboratory) are listed below for reference.     Procedures and Diagnostic Studies:   DG Chest Portable 1 View  Result Date: 12/24/2021 CLINICAL DATA:  Dyspnea. EXAM: PORTABLE CHEST 1 VIEW COMPARISON:  December 20, 2021 FINDINGS: Multiple overlying cardiac lead wires are seen. The heart size and mediastinal contours are within normal limits. Both lungs are clear. The visualized skeletal structures are unremarkable. IMPRESSION: No active disease. Electronically Signed   By: Aram Candela M.D.   On: 12/24/2021 00:56     Labs:   Basic Metabolic Panel: Recent Labs  Lab 12/20/21 1335 12/23/21 2315 12/24/21 0117 12/24/21 0343 12/24/21 0909 12/25/21 0053  NA 132*  137 138 136 137 134*  K 4.9 4.9 4.8 6.1* 5.4* 4.2  CL 94* 96*  --  97*  --  92*  CO2 32 35*  --  35*  --  34*  GLUCOSE 201* 156*  --  168*  --  146*  BUN 17 25*  --  26*  --  25*  CREATININE 1.18 1.07  --  1.12  --  0.70  CALCIUM 8.8* 8.6*  --  8.5*  --  8.8*   GFR Estimated Creatinine Clearance: 185.9 mL/min (by C-G formula based on SCr of 0.7 mg/dL). Liver Function Tests: Recent Labs  Lab 12/20/21 1335 12/23/21 2315  AST 64* 64*  ALT 90* 107*  ALKPHOS 67 69  BILITOT 1.5* 1.2  PROT 7.9 7.2  ALBUMIN 4.0  3.7   No results for input(s): LIPASE, AMYLASE in the last 168 hours. No results for input(s): AMMONIA in the last 168 hours. Coagulation profile No results for input(s): INR, PROTIME in the last 168 hours.  CBC: Recent Labs  Lab 12/20/21 1335 12/23/21 2315 12/24/21 0117 12/24/21 0343 12/24/21 0909 12/25/21 0053  WBC 11.1* 13.1*  --  11.4*  --  21.2*  NEUTROABS 9.0* 8.7*  --   --   --   --   HGB 15.0 15.0 16.7 15.1 16.7 13.4  HCT 47.9 51.5 49.0 51.1 49.0 43.3  MCV 93.7 99.2  --  98.8  --  93.5  PLT 223 238  --  244  --  242   Cardiac Enzymes: No results for input(s): CKTOTAL, CKMB, CKMBINDEX, TROPONINI in the last 168 hours. BNP: Invalid input(s): POCBNP CBG: Recent Labs  Lab 12/24/21 1801 12/25/21 0807 12/25/21 1148 12/25/21 1609 12/26/21 0757  GLUCAP 108* 264* 229* 171* 306*   D-Dimer No results for input(s): DDIMER in the last 72 hours. Hgb A1c No results for input(s): HGBA1C in the last 72 hours. Lipid Profile No results for input(s): CHOL, HDL, LDLCALC, TRIG, CHOLHDL, LDLDIRECT in the last 72 hours. Thyroid function studies No results for input(s): TSH, T4TOTAL, T3FREE, THYROIDAB in the last 72 hours.  Invalid input(s): FREET3 Anemia work up No results for input(s): VITAMINB12, FOLATE, FERRITIN, TIBC, IRON, RETICCTPCT in the last 72 hours. Microbiology Recent Results (from the past 240 hour(s))  Resp Panel by RT-PCR (Flu A&B, Covid) Nasopharyngeal Swab     Status: Abnormal   Collection Time: 12/20/21  1:12 PM   Specimen: Nasopharyngeal Swab; Nasopharyngeal(NP) swabs in vial transport medium  Result Value Ref Range Status   SARS Coronavirus 2 by RT PCR NEGATIVE NEGATIVE Final    Comment: (NOTE) SARS-CoV-2 target nucleic acids are NOT DETECTED.  The SARS-CoV-2 RNA is generally detectable in upper respiratory specimens during the acute phase of infection. The lowest concentration of SARS-CoV-2 viral copies this assay can detect is 138 copies/mL. A  negative result does not preclude SARS-Cov-2 infection and should not be used as the sole basis for treatment or other patient management decisions. A negative result may occur with  improper specimen collection/handling, submission of specimen other than nasopharyngeal swab, presence of viral mutation(s) within the areas targeted by this assay, and inadequate number of viral copies(<138 copies/mL). A negative result must be combined with clinical observations, patient history, and epidemiological information. The expected result is Negative.  Fact Sheet for Patients:  BloggerCourse.com  Fact Sheet for Healthcare Providers:  SeriousBroker.it  This test is no t yet approved or cleared by the [place] and  has been authorized  for detection and/or diagnosis of SARS-CoV-2 by FDA under an Emergency Use Authorization (EUA). This EUA will remain  in effect (meaning this test can be used) for the duration of the COVID-19 declaration under Section 564(b)(1) of the Act, 21 U.S.C.section 360bbb-3(b)(1), unless the authorization is terminated  or revoked sooner.       Influenza A by PCR POSITIVE (A) NEGATIVE Final   Influenza B by PCR NEGATIVE NEGATIVE Final    Comment: (NOTE) The Xpert Xpress SARS-CoV-2/FLU/RSV plus assay is intended as an aid in the diagnosis of influenza from Nasopharyngeal swab specimens and should not be used as a sole basis for treatment. Nasal washings and aspirates are unacceptable for Xpert Xpress SARS-CoV-2/FLU/RSV testing.  Fact Sheet for Patients: BloggerCourse.com  Fact Sheet for Healthcare Providers: SeriousBroker.it  This test is not yet approved or cleared by the [place] FDA and has been authorized for detection and/or diagnosis of SARS-CoV-2 by FDA under an Emergency Use Authorization (EUA). This EUA will remain in effect (meaning this test  can be used) for the duration of the COVID-19 declaration under Section 564(b)(1) of the Act, 21 U.S.C. section 360bbb-3(b)(1), unless the authorization is terminated or revoked.  Performed at [facility], [address], [place], [place] [address]   Resp Panel by RT-PCR (Flu A&B, Covid) Nasopharyngeal Swab     Status: Abnormal   Collection Time: 12/23/21 11:10 PM   Specimen: Nasopharyngeal Swab; Nasopharyngeal(NP) swabs in vial transport medium  Result Value Ref Range Status   SARS Coronavirus 2 by RT PCR NEGATIVE NEGATIVE Final    Comment: (NOTE) SARS-CoV-2 target nucleic acids are NOT DETECTED.  The SARS-CoV-2 RNA is generally detectable in upper respiratory specimens during the acute phase of infection. The lowest concentration of SARS-CoV-2 viral copies this assay can detect is 138 copies/mL. A negative result does not preclude SARS-Cov-2 infection and should not be used as the sole basis for treatment or other patient management decisions. A negative result may occur with  improper specimen collection/handling, submission of specimen other than nasopharyngeal swab, presence of viral mutation(s) within the areas targeted by this assay, and inadequate number of viral copies(<138 copies/mL). A negative result must be combined with clinical observations, patient history, and epidemiological information. The expected result is Negative.  Fact Sheet for Patients:  BloggerCourse.com  Fact Sheet for Healthcare Providers:  SeriousBroker.it  This test is no t yet approved or cleared by the [place] FDA and  has been authorized for detection and/or diagnosis of SARS-CoV-2 by FDA under an Emergency Use Authorization (EUA). This EUA will remain  in effect (meaning this test can be used) for the duration of the COVID-19 declaration under Section 564(b)(1) of the Act, 21 U.S.C.section 360bbb-3(b)(1), unless the  authorization is terminated  or revoked sooner.       Influenza A by PCR POSITIVE (A) NEGATIVE Final   Influenza B by PCR NEGATIVE NEGATIVE Final    Comment: (NOTE) The Xpert Xpress SARS-CoV-2/FLU/RSV plus assay is intended as an aid in the diagnosis of influenza from Nasopharyngeal swab specimens and should not be used as a sole basis for treatment. Nasal washings and aspirates are unacceptable for Xpert Xpress SARS-CoV-2/FLU/RSV testing.  Fact Sheet for Patients: BloggerCourse.com  Fact Sheet for Healthcare Providers: SeriousBroker.it  This test is not yet approved or cleared by the [place] FDA and has been authorized for detection and/or diagnosis of SARS-CoV-2 by FDA under an Emergency Use Authorization (EUA). This EUA will remain in effect (  meaning this test can be used) for the duration of the COVID-19 declaration under Section 564(b)(1) of the Act, 21 U.S.C. section 360bbb-3(b)(1), unless the authorization is terminated or revoked.  Performed at [facility], [address] [place], [place] [address]      Discharge Instructions:   Discharge Instructions     (HEART FAILURE PATIENTS) Call MD:  Anytime you have any of the following symptoms: 1) 3 pound weight gain in 24 hours or 5 pounds in 1 week 2) shortness of breath, with or without a dry hacking cough 3) swelling in the hands, feet or stomach 4) if you have to sleep on extra pillows at night in order to breathe.   Complete by: As directed    Diet - low sodium heart healthy   Complete by: As directed    Diet Carb Modified   Complete by: As directed    Heart Failure patients record your daily weight using the same scale at the same time of day   Complete by: As directed    Increase activity slowly   Complete by: As directed       Allergies as of 12/26/2021   No Known Allergies      Medication List     STOP taking these medications     albuterol (5 MG/ML) 0.5% nebulizer solution Commonly known as: PROVENTIL Replaced by: albuterol (2.5 MG/3ML) 0.083% nebulizer solution You also have another medication with the same name that you need to continue taking as instructed.   Cialis 20 MG tablet Generic drug: tadalafil   doxycycline 100 MG capsule Commonly known as: VIBRAMYCIN   potassium chloride SA 20 MEQ tablet Commonly known as: KLOR-CON M   Spiriva Respimat 1.25 MCG/ACT Aers Generic drug: Tiotropium Bromide Monohydrate       TAKE these medications    albuterol 108 (90 Base) MCG/ACT inhaler Commonly known as: VENTOLIN HFA Inhale 2 puffs into the lungs every 6 (six) hours as needed for wheezing or shortness of breath. What changed:  Another medication with the same name was added. Make sure you understand how and when to take each. Another medication with the same name was removed. Continue taking this medication, and follow the directions you see here.   albuterol (2.5 MG/3ML) 0.083% nebulizer solution Commonly known as: PROVENTIL Take 3 mLs (2.5 mg total) by nebulization every 2 (two) hours as needed for wheezing. What changed: You were already taking a medication with the same name, and this prescription was added. Make sure you understand how and when to take each. Replaces: albuterol (5 MG/ML) 0.5% nebulizer solution   budesonide-formoterol 160-4.5 MCG/ACT inhaler Commonly known as: SYMBICORT INHALE 2 PUFFS INTO THE LUNGS IN THE MORNING AND AT BEDTIME. What changed: additional instructions   carvedilol 12.5 MG tablet Commonly known as: COREG Take 12.5 mg by mouth 2 (two) times daily.   furosemide 40 MG tablet Commonly known as: LASIX Take 80 mg by mouth 2 (two) times daily.   losartan 25 MG tablet Commonly known as: COZAAR Take 25 mg by mouth daily.   OXYGEN Inhale 2-4 L into the lungs at bedtime.   polyethylene glycol 17 g packet Commonly known as: MIRALAX / GLYCOLAX Take 17 g by mouth  daily as needed for mild constipation.   predniSONE 10 MG tablet Commonly known as: DELTASONE 40 mg x 2 days then 30 mg x 2 days then 20 mg x 2 days then 10 mg x 2 days and stop What  changed:  how much to take how to take this when to take this additional instructions   Victoza 18 MG/3ML Sopn Generic drug: liraglutide Inject 3 mg into the skin daily.   Wellbutrin XL 300 MG 24 hr tablet Generic drug: buPROPion Take 300 mg by mouth daily.               Durable Medical Equipment  (From admission, onward)           Start     Ordered   12/25/21 0922  For home use only DME Nebulizer machine  Once       Question Answer Comment  Patient needs a nebulizer to treat with the following condition Asthma   Length of Need Lifetime      12/25/21 2694            Follow-up Information     Hollowell, Sidonie Dickens, MD .   Specialty: Family Medicine Contact information: [address] [place] [address] [phone]                  Time coordinating discharge: 35 min  Signed:  Joseph Art DO  [facility] 12/26/2021, 8:24 AM

## 2021-12-26 NOTE — Progress Notes (Signed)
Discharge instructions, RXs and follow up appts explained and provided to patient verbalized understanding. Portable O2 and discharge medications delivered to patients room and sent home with patient. Patient left floor via wheelchair accompanied by staff no c/o pain or shortness of breath at d/c.   Alexas Basulto, Kae Heller, RN

## 2021-12-26 NOTE — Progress Notes (Signed)
SATURATION QUALIFICATIONS: (This note is used to comply with regulatory documentation for home oxygen)  Patient Saturations on Room Air at Rest = 90%  Patient Saturations on Room Air while Ambulating = 79%  Patient Saturations on 3 Liters of oxygen while Ambulating = 95%  Please briefly explain why patient needs home oxygen:  Patient O2 sat dropped to 79% while ambulating on room air   Shelsie Tijerino, Kae Heller, RN

## 2022-01-02 NOTE — Progress Notes (Deleted)
Cardiology Office Note   Date:  01/02/2022   ID:  Joseph Morales, DOB [birth year], MRN [id number]  PCP:  Hollowell, Sidonie Dickens, MD  Cardiologist:   Rollene Rotunda, MD Referring:  ***  No chief complaint on file.     History of Present Illness: Joseph Morales is a 51 y.o. male who presents for evaluation of ***    He was in the hospital in May with respiratory failure.  I reviewed these records for this visit.    This was said to be related to hypoventilation obesity syndrome.  There was an element of acute diastolic HF.  [facility] [place] Course 5/21: Patient admitted to [facility]; placed on BiPAP; ABG respiratory acidosis  5/22: ABG improving. Weaning to BiPAP QHS and prn while napping. Supplemental O2 during the day. As of 5/23: Patient only wearing BiPAP at night and prn; Supplemental O2 during the day; Receiving breathing treatment; feels like his breathing is at baseline. We had planned on sending him home but walking oximetry demonstrated that his O2 sats dropped down to 81% on 2 liters after just ambulating 10 steps. Because of this his discharge was canceled and he was given additional diuretics.  5/24 as of morning rounds I&O balance -5.6 liters. Resting and walking oximetry obtained and were as follows: dropped to 85% rebounded w/ 4 liters.  He will be discharged to home with the plan of care as outlined below.      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 Vassie Loll, suspect that his oxygen needs have actually been a bit higher than he has been using at home. He typically just leaves it on 2 liters. He has BIPAP waiting for him. Just needs to get it  Plan: Dc on home oxygen 4lpm at all times.  Patient will pick up his BIPAP device current plan is settings BiPAP therapy on 25/21 cm H2O with a Large size Resmed Full Face Mask AirFit F20 mask and heated humidification. - 3L O2  should be blended in Home on 80mg  lasix daily in morning and 40 in afternoon. Daily weight w/ extra dose of 80 mg for 2 lb weight gain in 24 hours  F/u in our clinic w/in 1 week (6/2 at 2pm)   Decompensated diastolic heart failure with EF 75-80% Uncontrolled hypertension Plan: Home on coreg furosemide and Cozaar    Asthma exacerbation Plan Dc'd steroids Home on spiriva and symbicort w/ PRN SABA (has this at home)  no need for further abx (we think that this was more HF than asthma)   Leukocytosis: initially reactive. Then exacerbated by steroids Plan Stopping steroids Stopping abx     Hyperglycemia: due to  steroids Plan Dc steroids   Culture data/antimicrobials   Covid, flu, MRSA negative   Consults  Critical care   Past Medical History:  Diagnosis Date   Asthma    CHF (congestive heart failure) (HCC)    Chronic diastolic heart failure (HCC)    Hypertension    Hypoventilation associated with obesity syndrome (HCC)    Obesity     Past Surgical History:  Procedure Laterality Date   NO PAST SURGERIES       Current Outpatient Medications  Medication Sig Dispense Refill   albuterol (PROVENTIL) (2.5 MG/3ML) 0.083% nebulizer solution Use 1 vial (2.5 mg total) by nebulization every 2 (two) hours as needed for wheezing. 90 mL 0  albuterol (VENTOLIN HFA) 108 (90 Base) MCG/ACT inhaler Inhale 2 puffs into the lungs every 6 (six) hours as needed for wheezing or shortness of breath.     budesonide-formoterol (SYMBICORT) 160-4.5 MCG/ACT inhaler INHALE 2 PUFFS INTO THE LUNGS IN THE MORNING AND AT BEDTIME. (Patient taking differently: Inhale 2 puffs into the lungs 2 (two) times daily.) 10.2 g 11   carvedilol (COREG) 12.5 MG tablet Take 12.5 mg by mouth 2 (two) times daily.     furosemide (LASIX) 40 MG tablet Take 80 mg by mouth 2 (two) times daily.     losartan (COZAAR) 25 MG tablet Take 25 mg by mouth daily.     OXYGEN Inhale 2-4 L into the lungs at bedtime.      polyethylene glycol (MIRALAX / GLYCOLAX) 17 g packet Take 17 g by mouth daily as needed for mild constipation.     predniSONE (DELTASONE) 10 MG tablet take 4 tabs (40 mg) daily  x 2 days then 3 tabs (30 mg) daily x 2 days then 2 tabs (20 mg) daily x 2 days then 1 tab (10 mg) daily x 2 days and stop 20 tablet 0   VICTOZA 18 MG/3ML SOPN Inject 3 mg into the skin daily.     WELLBUTRIN XL 300 MG 24 hr tablet Take 300 mg by mouth daily.     No current facility-administered medications for this visit.    Allergies:   Patient has no known allergies.    Social History:  The patient  reports that he has been smoking cigarettes. He has a 20.00 pack-year smoking history. He has never used smokeless tobacco. He reports current drug use. Drug: Marijuana. He reports that he does not drink alcohol.   Family History:  The patient's ***family history includes Breast cancer in his mother; Congestive Heart Failure in his maternal grandmother; Diabetes in his mother; Emphysema in his maternal grandmother.    ROS:  Please see the history of present illness.   Otherwise, review of systems are positive for {NONE DEFAULTED:18576}.   All other systems are reviewed and negative.    PHYSICAL EXAM: VS:  There were no vitals taken for this visit. , BMI There is no height or weight on file to calculate BMI. GENERAL:  Well appearing HEENT:  Pupils equal round and reactive, fundi not visualized, oral mucosa unremarkable NECK:  No jugular venous distention, waveform within normal limits, carotid upstroke brisk and symmetric, no bruits, no thyromegaly LYMPHATICS:  No cervical, inguinal adenopathy LUNGS:  Clear to auscultation bilaterally BACK:  No CVA tenderness CHEST:  Unremarkable HEART:  PMI not displaced or sustained,S1 and S2 within normal limits, no S3, no S4, no clicks, no rubs, *** murmurs ABD:  Flat, positive bowel sounds normal in frequency in pitch, no bruits, no rebound, no guarding, no midline pulsatile  mass, no hepatomegaly, no splenomegaly EXT:  2 plus pulses throughout, no edema, no cyanosis no clubbing SKIN:  No rashes no nodules NEURO:  Cranial nerves II through XII grossly intact, motor grossly intact throughout PSYCH:  Cognitively intact, oriented to person place and time    EKG:  EKG {ACTION; IS/IS JJK:[id number]} ordered today. The ekg ordered today demonstrates ***   Recent Labs: 05/19/2021: Magnesium 2.5 06/18/2021: Pro B Natriuretic peptide (BNP) 16.0 12/23/2021: ALT 107; B Natriuretic Peptide 16.1 12/25/2021: BUN 25; Creatinine, Ser 0.70; Hemoglobin 13.4; Platelets 242; Potassium 4.2; Sodium 134    Lipid Panel    Component Value Date/Time   CHOL 199 06/23/2020  0502   TRIG 137 06/23/2020 0502   HDL 35 (L) 06/23/2020 0502   CHOLHDL 5.7 06/23/2020 0502   VLDL 27 06/23/2020 0502   LDLCALC 137 (H) 06/23/2020 0502      Wt Readings from Last 3 Encounters:  12/26/21 (!) 361 lb 15.9 oz (164.2 kg)  12/20/21 (!) 378 lb (171.5 kg)  06/18/21 (!) 373 lb (169.2 kg)      Other studies Reviewed: Additional studies/ records that were reviewed today include: ***. Review of the above records demonstrates:  Please see elsewhere in the note.  ***   ASSESSMENT AND PLAN:  DIASTOLIC HF:  ***  HYPOVENTILATION OBESITY SYNDROME:  ***    Current medicines are reviewed at length with the patient today.  The patient {ACTIONS; HAS/DOES NOT HAVE:19233} concerns regarding medicines.  The following changes have been made:  {PLAN; NO CHANGE:13088:s}  Labs/ tests ordered today include: *** No orders of the defined types were placed in this encounter.    Disposition:   FU with ***    Signed, Rollene RotundaJames Phynix Horton, MD  01/02/2022 9:57 PM    [facility]

## 2022-01-03 ENCOUNTER — Ambulatory Visit: Payer: Medicaid Other | Admitting: Cardiology

## 2022-01-10 ENCOUNTER — Ambulatory Visit: Payer: Medicaid Other | Admitting: Cardiology

## 2022-01-14 ENCOUNTER — Telehealth: Payer: Self-pay | Admitting: Pulmonary Disease

## 2022-01-14 NOTE — Telephone Encounter (Signed)
Joseph Drilling, do you have this CMN? Thanks!

## 2022-01-15 NOTE — Telephone Encounter (Signed)
Spoke with Francis Dowse at [employer] and asked him to fax the CMN to [facility]'s fax-[phone] Will forward to Johny Drilling to keep an eye out for this, thanks!

## 2022-01-15 NOTE — Telephone Encounter (Signed)
No.  Joseph Morales has not received this CMN.

## 2022-01-16 NOTE — Telephone Encounter (Signed)
This has been faxed back to the number requested

## 2022-01-16 NOTE — Telephone Encounter (Signed)
I have this CMN I have message Meagan to fax the fax to her in [place] since [place] is out there

## 2022-01-23 NOTE — Progress Notes (Deleted)
Cardiology Office Note   Date:  01/23/2022   ID:  Damarious Holtsclaw, DOB 11/21/1971, MRN [id number]  PCP:  Hollowell, Sidonie Dickens, MD  Cardiologist:   Rollene Rotunda, MD Referring:  ***  No chief complaint on file.     History of Present Illness: Harjit Leider is a 51 y.o. male who presents for evaluation of ***    He was in the hospital in May with respiratory failure.  I reviewed these records for this visit.    This was said to be related to hypoventilation obesity syndrome.  There was an element of acute diastolic HF.  [facility] Course 5/21: Patient admitted to [facility]; placed on BiPAP; ABG respiratory acidosis  5/22: ABG improving. Weaning to BiPAP QHS and prn while napping. Supplemental O2 during the day. As of 5/23: Patient only wearing BiPAP at night and prn; Supplemental O2 during the day; Receiving breathing treatment; feels like his breathing is at baseline. We had planned on sending him home but walking oximetry demonstrated that his O2 sats dropped down to 81% on 2 liters after just ambulating 10 steps. Because of this his discharge was canceled and he was given additional diuretics.  5/24 as of morning rounds I&O balance -5.6 liters. Resting and walking oximetry obtained and were as follows: dropped to 85% rebounded w/ 4 liters.  He will be discharged to home with the plan of care as outlined below.      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 Vassie Loll, suspect that his oxygen needs have actually been a bit higher than he has been using at home. He typically just leaves it on 2 liters. He has BIPAP waiting for him. Just needs to get it  Plan: Dc on home oxygen 4lpm at all times.  Patient will pick up his BIPAP device current plan is settings BiPAP therapy on 25/21 cm H2O with a Large size Resmed Full Face Mask AirFit F20 mask and heated humidification. - 3L  O2 should be blended in Home on 80mg  lasix daily in morning and 40 in afternoon. Daily weight w/ extra dose of 80 mg for 2 lb weight gain in 24 hours  F/u in our clinic w/in 1 week (6/2 at 2pm)   Decompensated diastolic heart failure with EF 75-80% Uncontrolled hypertension Plan: Home on coreg furosemide and Cozaar    Asthma exacerbation Plan Dc'd steroids Home on spiriva and symbicort w/ PRN SABA (has this at home)  no need for further abx (we think that this was more HF than asthma)   Leukocytosis: initially reactive. Then exacerbated by steroids Plan Stopping steroids Stopping abx     Hyperglycemia: due to  steroids Plan Dc steroids   Culture data/antimicrobials   Covid, flu, MRSA negative   Consults  Critical care   Past Medical History:  Diagnosis Date   Asthma    CHF (congestive heart failure) (HCC)    Chronic diastolic heart failure (HCC)    Hypertension    Hypoventilation associated with obesity syndrome (HCC)    Obesity     Past Surgical History:  Procedure Laterality Date   NO PAST SURGERIES       Current Outpatient Medications  Medication Sig Dispense Refill   albuterol (PROVENTIL) (2.5 MG/3ML) 0.083% nebulizer solution Use 1 vial (2.5 mg total) by nebulization every 2 (two) hours as needed for wheezing. 90 mL 0  albuterol (VENTOLIN HFA) 108 (90 Base) MCG/ACT inhaler Inhale 2 puffs into the lungs every 6 (six) hours as needed for wheezing or shortness of breath.     budesonide-formoterol (SYMBICORT) 160-4.5 MCG/ACT inhaler INHALE 2 PUFFS INTO THE LUNGS IN THE MORNING AND AT BEDTIME. (Patient taking differently: Inhale 2 puffs into the lungs 2 (two) times daily.) 10.2 g 11   carvedilol (COREG) 12.5 MG tablet Take 12.5 mg by mouth 2 (two) times daily.     furosemide (LASIX) 40 MG tablet Take 80 mg by mouth 2 (two) times daily.     losartan (COZAAR) 25 MG tablet Take 25 mg by mouth daily.     OXYGEN Inhale 2-4 L into the lungs at bedtime.      polyethylene glycol (MIRALAX / GLYCOLAX) 17 g packet Take 17 g by mouth daily as needed for mild constipation.     predniSONE (DELTASONE) 10 MG tablet take 4 tabs (40 mg) daily  x 2 days then 3 tabs (30 mg) daily x 2 days then 2 tabs (20 mg) daily x 2 days then 1 tab (10 mg) daily x 2 days and stop 20 tablet 0   VICTOZA 18 MG/3ML SOPN Inject 3 mg into the skin daily.     WELLBUTRIN XL 300 MG 24 hr tablet Take 300 mg by mouth daily.     No current facility-administered medications for this visit.    Allergies:   Patient has no known allergies.    Social History:  The patient  reports that he has been smoking cigarettes. He has a 20.00 pack-year smoking history. He has never used smokeless tobacco. He reports current drug use. Drug: Marijuana. He reports that he does not drink alcohol.   Family History:  The patient's ***family history includes Breast cancer in his mother; Congestive Heart Failure in his maternal grandmother; Diabetes in his mother; Emphysema in his maternal grandmother.    ROS:  Please see the history of present illness.   Otherwise, review of systems are positive for {NONE DEFAULTED:18576}.   All other systems are reviewed and negative.    PHYSICAL EXAM: VS:  There were no vitals taken for this visit. , BMI There is no height or weight on file to calculate BMI. GENERAL:  Well appearing HEENT:  Pupils equal round and reactive, fundi not visualized, oral mucosa unremarkable NECK:  No jugular venous distention, waveform within normal limits, carotid upstroke brisk and symmetric, no bruits, no thyromegaly LYMPHATICS:  No cervical, inguinal adenopathy LUNGS:  Clear to auscultation bilaterally BACK:  No CVA tenderness CHEST:  Unremarkable HEART:  PMI not displaced or sustained,S1 and S2 within normal limits, no S3, no S4, no clicks, no rubs, *** murmurs ABD:  Flat, positive bowel sounds normal in frequency in pitch, no bruits, no rebound, no guarding, no midline pulsatile  mass, no hepatomegaly, no splenomegaly EXT:  2 plus pulses throughout, no edema, no cyanosis no clubbing SKIN:  No rashes no nodules NEURO:  Cranial nerves II through XII grossly intact, motor grossly intact throughout PSYCH:  Cognitively intact, oriented to person place and time    EKG:  EKG {ACTION; IS/IS NID:[id number]} ordered today. The ekg ordered today demonstrates ***   Recent Labs: 05/19/2021: Magnesium 2.5 06/18/2021: Pro B Natriuretic peptide (BNP) 16.0 12/23/2021: ALT 107; B Natriuretic Peptide 16.1 12/25/2021: BUN 25; Creatinine, Ser 0.70; Hemoglobin 13.4; Platelets 242; Potassium 4.2; Sodium 134    Lipid Panel    Component Value Date/Time   CHOL 199 06/23/2020  0502   TRIG 137 06/23/2020 0502   HDL 35 (L) 06/23/2020 0502   CHOLHDL 5.7 06/23/2020 0502   VLDL 27 06/23/2020 0502   LDLCALC 137 (H) 06/23/2020 0502      Wt Readings from Last 3 Encounters:  12/26/21 (!) 361 lb 15.9 oz (164.2 kg)  12/20/21 (!) 378 lb (171.5 kg)  06/18/21 (!) 373 lb (169.2 kg)      Other studies Reviewed: Additional studies/ records that were reviewed today include: ***. Review of the above records demonstrates:  Please see elsewhere in the note.  ***   ASSESSMENT AND PLAN:  DIASTOLIC HF:  ***  HYPOVENTILATION OBESITY SYNDROME:  ***    Current medicines are reviewed at length with the patient today.  The patient {ACTIONS; HAS/DOES NOT HAVE:19233} concerns regarding medicines.  The following changes have been made:  {PLAN; NO CHANGE:13088:s}  Labs/ tests ordered today include: *** No orders of the defined types were placed in this encounter.    Disposition:   FU with ***    Signed, Rollene RotundaJames Tracia Lacomb, MD  01/23/2022 9:05 PM    [facility]

## 2022-01-24 ENCOUNTER — Ambulatory Visit: Payer: Medicaid Other | Admitting: Cardiology

## 2022-02-06 ENCOUNTER — Other Ambulatory Visit: Payer: Self-pay

## 2022-02-06 ENCOUNTER — Ambulatory Visit (INDEPENDENT_AMBULATORY_CARE_PROVIDER_SITE_OTHER): Payer: Medicaid Other | Admitting: Cardiology

## 2022-02-06 ENCOUNTER — Encounter: Payer: Self-pay | Admitting: Cardiology

## 2022-02-06 VITALS — BP 114/58 | HR 98 | Ht 75.0 in | Wt 379.8 lb

## 2022-02-06 DIAGNOSIS — I10 Essential (primary) hypertension: Secondary | ICD-10-CM

## 2022-02-06 DIAGNOSIS — I5032 Chronic diastolic (congestive) heart failure: Secondary | ICD-10-CM

## 2022-02-06 DIAGNOSIS — E785 Hyperlipidemia, unspecified: Secondary | ICD-10-CM | POA: Diagnosis not present

## 2022-02-06 DIAGNOSIS — R0602 Shortness of breath: Secondary | ICD-10-CM

## 2022-02-06 MED ORDER — ROSUVASTATIN CALCIUM 5 MG PO TABS: 5.0000 mg | ORAL_TABLET | Freq: Every day | ORAL | 3 refills | Status: DC

## 2022-02-06 NOTE — Patient Instructions (Signed)
Medication Instructions:  Your physician has recommended you make the following change in your medication:  START: Crestor 5 mg once daily *If you need a refill on your cardiac medications before your next appointment, please call your pharmacy*   Lab Work: None If you have labs (blood work) drawn today and your tests are completely normal, you will receive your results only by: MyChart Message (if you have MyChart) OR A paper copy in the mail If you have any lab test that is abnormal or we need to change your treatment, we will call you to review the results.   Testing/Procedures: Your physician has requested that you have an echocardiogram. Echocardiography is a painless test that uses sound waves to create images of your heart. It provides your doctor with information about the size and shape of your heart and how well your hearts chambers and valves are working. This procedure takes approximately one hour. There are no restrictions for this procedure.    Follow-Up: At [facility], you and your health needs are our priority.  As part of our continuing mission to provide you with exceptional heart care, we have created designated Provider Care Teams.  These Care Teams include your primary Cardiologist (physician) and Advanced Practice Providers (APPs -  Physician Assistants and Nurse Practitioners) who all work together to provide you with the care you need, when you need it.  We recommend signing up for the patient portal called "MyChart".  Sign up information is provided on this After Visit Summary.  MyChart is used to connect with patients for Virtual Visits (Telemedicine).  Patients are able to view lab/test results, encounter notes, upcoming appointments, etc.  Non-urgent messages can be sent to your provider as well.   To learn more about what you can do with MyChart, go to [email or web address].    Your next appointment:   6 month(s)  The format for your next appointment:    In Person  Provider:   Thomasene Ripple, DO

## 2022-02-06 NOTE — Progress Notes (Signed)
Cardiology Office Note:    Date:  02/07/2022   ID:  Joseph Morales, DOB Apr 26, 1971, MRN [id number]  PCP:  Glendell Docker Wilhelmenia Blase, MD  Cardiologist:  Berniece Salines, DO  Electrophysiologist:  None   Referring MD: Weston Brass*   " I am doing well"  History of Present Illness:    Joseph Morales is a 51 y.o. male with a hx of chronic diastolic heart failure, chronic respiratory failure, hypertension Morbid obesity, diabetes mellitus hemoglobin A1c hemoglobin A1c in May 2022 showed 6.6.  The patient tells me he was diagnosed with chronic diastolic heart failure back in 2020 and had not seen cardiology since that time.  He notes that he has been in and out of the hospital for respiratory failure and heart failure treatments.  Recently he has been placed on Lasix 80 mg twice daily which seems to be working.  Since his hospitalization on December 2022 he says he has been doing well.  He has quit smoking and also has been started on a weight loss regimen with Victoza.  Here for no complaints today.  No chest pain or shortness of breath.   Past Medical History:  Diagnosis Date   Asthma    CHF (congestive heart failure) (HCC)    Chronic diastolic heart failure (HCC)    Hypertension    Hypoventilation associated with obesity syndrome ([place])    Obesity     Past Surgical History:  Procedure Laterality Date   NO PAST SURGERIES      Current Medications: Current Meds  Medication Sig   albuterol (PROVENTIL) (2.5 MG/3ML) 0.083% nebulizer solution Use 1 vial (2.5 mg total) by nebulization every 2 (two) hours as needed for wheezing.   albuterol (VENTOLIN HFA) 108 (90 Base) MCG/ACT inhaler Inhale 2 puffs into the lungs every 6 (six) hours as needed for wheezing or shortness of breath.   budesonide-formoterol (SYMBICORT) 160-4.5 MCG/ACT inhaler INHALE 2 PUFFS INTO THE LUNGS IN THE MORNING AND AT BEDTIME. (Patient taking differently: Inhale 2 puffs into the lungs 2 (two) times daily.)    carvedilol (COREG) 12.5 MG tablet Take 12.5 mg by mouth 2 (two) times daily.   furosemide (LASIX) 40 MG tablet Take 80 mg by mouth 2 (two) times daily.   losartan (COZAAR) 25 MG tablet Take 25 mg by mouth daily.   OXYGEN Inhale 2-4 L into the lungs at bedtime.   rosuvastatin (CRESTOR) 5 MG tablet Take 1 tablet (5 mg total) by mouth daily.   VICTOZA 18 MG/3ML SOPN Inject 3 mg into the skin daily.   WELLBUTRIN XL 300 MG 24 hr tablet Take 300 mg by mouth daily.     Allergies:   Patient has no known allergies.   Social History   Socioeconomic History   Marital status: Single    Spouse name: Not on file   Number of children: Not on file   Years of education: Not on file   Highest education level: Not on file  Occupational History   Not on file  Tobacco Use   Smoking status: Former    Packs/day: 1.00    Years: 20.00    Pack years: 20.00    Types: Cigarettes   Smokeless tobacco: Never  Vaping Use   Vaping Use: Never used  Substance and Sexual Activity   Alcohol use: Never   Drug use: Yes    Types: Marijuana    Comment: occasional marijuana   Sexual activity: Not on file  Other Topics Concern  Not on file  Social History Narrative   Not on file   Social Determinants of Health   Financial Resource Strain: Not on file  Food Insecurity: Not on file  Transportation Needs: Not on file  Physical Activity: Not on file  Stress: Not on file  Social Connections: Not on file     Family History: The patient's family history includes Breast cancer in his mother; Congestive Heart Failure in his maternal grandmother; Diabetes in his mother; Emphysema in his maternal grandmother.  ROS:   Review of Systems  Constitution: Negative for decreased appetite, fever and weight gain.  HENT: Negative for congestion, ear discharge, hoarse voice and sore throat.   Eyes: Negative for discharge, redness, vision loss in right eye and visual halos.  Cardiovascular: Negative for chest pain,  dyspnea on exertion, leg swelling, orthopnea and palpitations.  Respiratory: Negative for cough, hemoptysis, shortness of breath and snoring.   Endocrine: Negative for heat intolerance and polyphagia.  Hematologic/Lymphatic: Negative for bleeding problem. Does not bruise/bleed easily.  Skin: Negative for flushing, nail changes, rash and suspicious lesions.  Musculoskeletal: Negative for arthritis, joint pain, muscle cramps, myalgias, neck pain and stiffness.  Gastrointestinal: Negative for abdominal pain, bowel incontinence, diarrhea and excessive appetite.  Genitourinary: Negative for decreased libido, genital sores and incomplete emptying.  Neurological: Negative for brief paralysis, focal weakness, headaches and loss of balance.  Psychiatric/Behavioral: Negative for altered mental status, depression and suicidal ideas.  Allergic/Immunologic: Negative for HIV exposure and persistent infections.    EKGs/Labs/Other Studies Reviewed:    The following studies were reviewed today:   EKG:  The ekg ordered today demonstrates sinus rhythm, heart rate 98 bpm.   TTE 06/22/2020 IMPRESSIONS   1. Left ventricular ejection fraction, by estimation, is 70 to 75%. The left ventricle has hyperdynamic function. The left ventricle has no regional wall motion abnormalities. There is mild left ventricular hypertrophy. Left ventricular diastolic  parameters are indeterminate.   2. Leftward septal shift with respiratory variation, could suggest  primary lung pathology vs constrictive physiology.   3. Right ventricular systolic function is mildly reduced. The right  ventricular size is moderately enlarged. Tricuspid regurgitation signal is  inadequate for assessing PA pressure.   4. The mitral valve is grossly normal. No evidence of mitral valve  regurgitation.   5. The aortic valve was not well visualized. Aortic valve regurgitation  is not visualized.   6. The inferior vena cava is dilated in size with  <50% respiratory  variability, suggesting right atrial pressure of 15 mmHg.   FINDINGS   Left Ventricle: Left ventricular ejection fraction, by estimation, is 70  to 75%. The left ventricle has hyperdynamic function. The left ventricle  has no regional wall motion abnormalities. The left ventricular internal  cavity size was normal in size.  There is mild left ventricular hypertrophy. Left ventricular diastolic  parameters are indeterminate.   Right Ventricle: The right ventricular size is moderately enlarged. Right  vetricular wall thickness was not assessed. Right ventricular systolic  function is mildly reduced. Tricuspid regurgitation signal is inadequate  for assessing PA pressure.   Left Atrium: Left atrial size was not well visualized.   Right Atrium: Right atrial size was not well visualized.   Pericardium: There is no evidence of pericardial effusion.   Mitral Valve: The mitral valve is grossly normal. No evidence of mitral  valve regurgitation.   Tricuspid Valve: The tricuspid valve is not well visualized. Tricuspid  valve regurgitation is not demonstrated. No  evidence of tricuspid  stenosis.   Aortic Valve: The aortic valve was not well visualized. Aortic valve  regurgitation is not visualized.   Pulmonic Valve: The pulmonic valve was not well visualized. Pulmonic valve  regurgitation is not visualized. No evidence of pulmonic stenosis.   Aorta: The aortic root was not well visualized and the ascending aorta was  not well visualized.   Venous: The inferior vena cava is dilated in size with less than 50%  respiratory variability, suggesting right atrial pressure of 15 mmHg.   IAS/Shunts: The interatrial septum was not well visualized.   Recent Labs: 05/19/2021: Magnesium 2.5 06/18/2021: Pro B Natriuretic peptide (BNP) 16.0 12/23/2021: ALT 107; B Natriuretic Peptide 16.1 12/25/2021: BUN 25; Creatinine, Ser 0.70; Hemoglobin 13.4; Platelets 242; Potassium 4.2;  Sodium 134  Recent Lipid Panel    Component Value Date/Time   CHOL 199 06/23/2020 0502   TRIG 137 06/23/2020 0502   HDL 35 (L) 06/23/2020 0502   CHOLHDL 5.7 06/23/2020 0502   VLDL 27 06/23/2020 0502   LDLCALC 137 (H) 06/23/2020 0502    Physical Exam:    VS:  BP (!) 114/58    Pulse 98    Ht 6\' 3"  (1.905 m)    Wt (!) 379 lb 12.8 oz (172.3 kg)    SpO2 93%    BMI 47.47 kg/m     Wt Readings from Last 3 Encounters:  02/06/22 (!) 379 lb 12.8 oz (172.3 kg)  12/26/21 (!) 361 lb 15.9 oz (164.2 kg)  12/20/21 (!) 378 lb (171.5 kg)     GEN: Well nourished, well developed in no acute distress HEENT: Normal NECK: No JVD; No carotid bruits LYMPHATICS: No lymphadenopathy CARDIAC: S1S2 noted,RRR, no murmurs, rubs, gallops RESPIRATORY:  Clear to auscultation without rales, wheezing or rhonchi  ABDOMEN: Soft, non-tender, non-distended, +bowel sounds, no guarding. EXTREMITIES: No edema, No cyanosis, no clubbing MUSCULOSKELETAL:  No deformity  SKIN: Warm and dry NEUROLOGIC:  Alert and oriented x 3, non-focal PSYCHIATRIC:  Normal affect, good insight  ASSESSMENT:    1. Chronic diastolic heart failure ([place])   2. Hyperlipidemia, unspecified hyperlipidemia type   3. SOB (shortness of breath)   4. Essential hypertension    PLAN:    Clinically he appears to be euvolemic we will keep the current dose of diuretics. We talked about the fact that he is diabetic 6.6 hemoglobin A1c which was done in May 2022.  He was not aware of this.  He is going to speak with his PCP about any further treatment. He is getting Victoza for weight loss he tells me. He has had intermittent shortness of breath we will get the patient repeat echocardiogram to reassess his LV and RV function. For his diabetes and his elevated lipids were started patient on Crestor 5 mg.  The patient is in agreement with the above plan. The patient left the office in stable condition.  The patient will follow up in 6  months   Medication Adjustments/Labs and Tests Ordered: Current medicines are reviewed at length with the patient today.  Concerns regarding medicines are outlined above.  Orders Placed This Encounter  Procedures   EKG 12-Lead   ECHOCARDIOGRAM COMPLETE   Meds ordered this encounter  Medications   rosuvastatin (CRESTOR) 5 MG tablet    Sig: Take 1 tablet (5 mg total) by mouth daily.    Dispense:  90 tablet    Refill:  3    Patient Instructions  Medication Instructions:  Your physician  has recommended you make the following change in your medication:  START: Crestor 5 mg once daily *If you need a refill on your cardiac medications before your next appointment, please call your pharmacy*   Lab Work: None If you have labs (blood work) drawn today and your tests are completely normal, you will receive your results only by: [place] (if you have MyChart) OR A paper copy in the mail If you have any lab test that is abnormal or we need to change your treatment, we will call you to review the results.   Testing/Procedures: Your physician has requested that you have an echocardiogram. Echocardiography is a painless test that uses sound waves to create images of your heart. It provides your doctor with information about the size and shape of your heart and how well your hearts chambers and valves are working. This procedure takes approximately one hour. There are no restrictions for this procedure.    Follow-Up: At [facility], you and your health needs are our priority.  As part of our continuing mission to provide you with exceptional heart care, we have created designated Provider Care Teams.  These Care Teams include your primary Cardiologist (physician) and Advanced Practice Providers (APPs -  Physician Assistants and Nurse Practitioners) who all work together to provide you with the care you need, when you need it.  We recommend signing up for the patient portal called  "MyChart".  Sign up information is provided on this After Visit Summary.  MyChart is used to connect with patients for Virtual Visits (Telemedicine).  Patients are able to view lab/test results, encounter notes, upcoming appointments, etc.  Non-urgent messages can be sent to your provider as well.   To learn more about what you can do with MyChart, go to [email or web address].    Your next appointment:   6 month(s)  The format for your next appointment:   In Person  Provider:   Berniece Salines, DO   Adopting a Healthy Lifestyle.  Know what a healthy weight is for you (roughly BMI <25) and aim to maintain this   Aim for 7+ servings of fruits and vegetables daily   65-80+ fluid ounces of water or unsweet tea for healthy kidneys   Limit to max 1 drink of alcohol per day; avoid smoking/tobacco   Limit animal fats in diet for cholesterol and heart health - choose grass fed whenever available   Avoid highly processed foods, and foods high in saturated/trans fats   Aim for low stress - take time to unwind and care for your mental health   Aim for 150 min of moderate intensity exercise weekly for heart health, and weights twice weekly for bone health   Aim for 7-9 hours of sleep daily   When it comes to diets, agreement about the perfect plan isnt easy to find, even among the experts. Experts at the [place] [employer] developed an idea known as the Healthy Eating Plate. Just imagine a plate divided into logical, healthy portions.   The emphasis is on diet quality:   Load up on vegetables and fruits - one-half of your plate: Aim for color and variety, and remember that potatoes dont count.   Go for whole grains - one-quarter of your plate: Whole wheat, barley, wheat berries, quinoa, oats, brown rice, and foods made with them. If you want pasta, go with whole wheat pasta.   Protein power - one-quarter of your plate: Fish, chicken, beans, and nuts are  all healthy, versatile  protein sources. Limit red meat.   The diet, however, does go beyond the plate, offering a few other suggestions.   Use healthy plant oils, such as olive, canola, soy, corn, sunflower and peanut. Check the labels, and avoid partially hydrogenated oil, which have unhealthy trans fats.   If youre thirsty, drink water. Coffee and tea are good in moderation, but skip sugary drinks and limit milk and dairy products to one or two daily servings.   The type of carbohydrate in the diet is more important than the amount. Some sources of carbohydrates, such as vegetables, fruits, whole grains, and beans-are healthier than others.   Finally, stay active  Signed, Berniece Salines, DO  02/07/2022 8:16 AM    [place]

## 2022-02-17 ENCOUNTER — Ambulatory Visit (HOSPITAL_COMMUNITY): Payer: Medicaid Other

## 2022-02-18 ENCOUNTER — Ambulatory Visit: Payer: Medicaid Other | Admitting: Cardiology

## 2022-03-10 ENCOUNTER — Ambulatory Visit (HOSPITAL_COMMUNITY): Admission: RE | Admit: 2022-03-10 | Payer: Medicaid Other | Source: Ambulatory Visit

## 2022-03-10 ENCOUNTER — Encounter (HOSPITAL_COMMUNITY): Payer: Self-pay | Admitting: Cardiology

## 2022-05-25 ENCOUNTER — Inpatient Hospital Stay (HOSPITAL_COMMUNITY)
Admission: EM | Admit: 2022-05-25 | Discharge: 2022-05-31 | DRG: 208 | Disposition: A | Discharge status: Home / Self Care | Payer: Medicaid Other | Attending: Internal Medicine | Admitting: Internal Medicine

## 2022-05-25 ENCOUNTER — Emergency Department (HOSPITAL_COMMUNITY): Payer: Medicaid Other

## 2022-05-25 ENCOUNTER — Inpatient Hospital Stay (HOSPITAL_COMMUNITY): Payer: Medicaid Other

## 2022-05-25 ENCOUNTER — Encounter (HOSPITAL_COMMUNITY): Payer: Self-pay

## 2022-05-25 DIAGNOSIS — J9601 Acute respiratory failure with hypoxia: Secondary | ICD-10-CM | POA: Diagnosis not present

## 2022-05-25 DIAGNOSIS — E1165 Type 2 diabetes mellitus with hyperglycemia: Secondary | ICD-10-CM | POA: Diagnosis not present

## 2022-05-25 DIAGNOSIS — J440 Chronic obstructive pulmonary disease with acute lower respiratory infection: Secondary | ICD-10-CM | POA: Diagnosis present

## 2022-05-25 DIAGNOSIS — N179 Acute kidney failure, unspecified: Secondary | ICD-10-CM | POA: Diagnosis present

## 2022-05-25 DIAGNOSIS — Z7951 Long term (current) use of inhaled steroids: Secondary | ICD-10-CM

## 2022-05-25 DIAGNOSIS — Z20822 Contact with and (suspected) exposure to covid-19: Secondary | ICD-10-CM | POA: Diagnosis present

## 2022-05-25 DIAGNOSIS — D72829 Elevated white blood cell count, unspecified: Secondary | ICD-10-CM | POA: Diagnosis present

## 2022-05-25 DIAGNOSIS — J189 Pneumonia, unspecified organism: Secondary | ICD-10-CM | POA: Diagnosis present

## 2022-05-25 DIAGNOSIS — E875 Hyperkalemia: Secondary | ICD-10-CM | POA: Diagnosis present

## 2022-05-25 DIAGNOSIS — J9621 Acute and chronic respiratory failure with hypoxia: Principal | ICD-10-CM | POA: Diagnosis present

## 2022-05-25 DIAGNOSIS — J441 Chronic obstructive pulmonary disease with (acute) exacerbation: Secondary | ICD-10-CM

## 2022-05-25 DIAGNOSIS — J45901 Unspecified asthma with (acute) exacerbation: Secondary | ICD-10-CM | POA: Diagnosis present

## 2022-05-25 DIAGNOSIS — E119 Type 2 diabetes mellitus without complications: Secondary | ICD-10-CM

## 2022-05-25 DIAGNOSIS — Z9911 Dependence on respirator [ventilator] status: Secondary | ICD-10-CM

## 2022-05-25 DIAGNOSIS — E662 Morbid (severe) obesity with alveolar hypoventilation: Secondary | ICD-10-CM | POA: Diagnosis present

## 2022-05-25 DIAGNOSIS — R7401 Elevation of levels of liver transaminase levels: Secondary | ICD-10-CM

## 2022-05-25 DIAGNOSIS — I5033 Acute on chronic diastolic (congestive) heart failure: Secondary | ICD-10-CM | POA: Diagnosis not present

## 2022-05-25 DIAGNOSIS — I110 Hypertensive heart disease with heart failure: Secondary | ICD-10-CM | POA: Diagnosis present

## 2022-05-25 DIAGNOSIS — J9811 Atelectasis: Secondary | ICD-10-CM | POA: Diagnosis present

## 2022-05-25 DIAGNOSIS — Z9981 Dependence on supplemental oxygen: Secondary | ICD-10-CM

## 2022-05-25 DIAGNOSIS — Z6841 Body Mass Index (BMI) 40.0 and over, adult: Secondary | ICD-10-CM

## 2022-05-25 DIAGNOSIS — J9622 Acute and chronic respiratory failure with hypercapnia: Secondary | ICD-10-CM | POA: Diagnosis present

## 2022-05-25 DIAGNOSIS — Z79899 Other long term (current) drug therapy: Secondary | ICD-10-CM

## 2022-05-25 DIAGNOSIS — J9602 Acute respiratory failure with hypercapnia: Secondary | ICD-10-CM | POA: Diagnosis not present

## 2022-05-25 DIAGNOSIS — E66813 Obesity, class 3: Secondary | ICD-10-CM | POA: Diagnosis present

## 2022-05-25 DIAGNOSIS — J9600 Acute respiratory failure, unspecified whether with hypoxia or hypercapnia: Secondary | ICD-10-CM | POA: Diagnosis present

## 2022-05-25 DIAGNOSIS — A419 Sepsis, unspecified organism: Secondary | ICD-10-CM

## 2022-05-25 DIAGNOSIS — G9341 Metabolic encephalopathy: Secondary | ICD-10-CM | POA: Diagnosis not present

## 2022-05-25 HISTORY — DX: Chronic obstructive pulmonary disease, unspecified: J44.9

## 2022-05-25 LAB — BLOOD GAS, ARTERIAL
Acid-Base Excess: 15.5 mmol/L — ABNORMAL HIGH (ref 0.0–2.0)
Bicarbonate: 42.8 mmol/L — ABNORMAL HIGH (ref 20.0–28.0)
Drawn by: 560031
FIO2: 60 %
MECHVT: 600 mL
O2 Saturation: 97.2 %
PEEP: 5 cmH2O
Patient temperature: 100.7
RATE: 22 resp/min
pCO2 arterial: 63 mmHg — ABNORMAL HIGH (ref 32–48)
pH, Arterial: 7.44 (ref 7.35–7.45)
pO2, Arterial: 86 mmHg (ref 83–108)

## 2022-05-25 LAB — COMPREHENSIVE METABOLIC PANEL
ALT: 49 U/L — ABNORMAL HIGH (ref 0–44)
AST: 49 U/L — ABNORMAL HIGH (ref 15–41)
Albumin: 4.3 g/dL (ref 3.5–5.0)
Alkaline Phosphatase: 81 U/L (ref 38–126)
Anion gap: 6 (ref 5–15)
BUN: 26 mg/dL — ABNORMAL HIGH (ref 6–20)
CO2: 37 mmol/L — ABNORMAL HIGH (ref 22–32)
Calcium: 8.6 mg/dL — ABNORMAL LOW (ref 8.9–10.3)
Chloride: 93 mmol/L — ABNORMAL LOW (ref 98–111)
Creatinine, Ser: 1.48 mg/dL — ABNORMAL HIGH (ref 0.61–1.24)
GFR, Estimated: 58 mL/min — ABNORMAL LOW (ref >60)
Glucose, Bld: 261 mg/dL — ABNORMAL HIGH (ref 70–99)
Potassium: 6.5 mmol/L (ref 3.5–5.1)
Sodium: 136 mmol/L (ref 135–145)
Total Bilirubin: 1.4 mg/dL — ABNORMAL HIGH (ref 0.3–1.2)
Total Protein: 8.7 g/dL — ABNORMAL HIGH (ref 6.5–8.1)

## 2022-05-25 LAB — CBC WITH DIFFERENTIAL/PLATELET
Abs Immature Granulocytes: 0.66 10*3/uL — ABNORMAL HIGH (ref 0.00–0.07)
Basophils Absolute: 0.2 10*3/uL — ABNORMAL HIGH (ref 0.0–0.1)
Basophils Relative: 1 %
Eosinophils Absolute: 0.2 10*3/uL (ref 0.0–0.5)
Eosinophils Relative: 1 %
HCT: 50.3 % (ref 39.0–52.0)
Hemoglobin: 15.6 g/dL (ref 13.0–17.0)
Immature Granulocytes: 2 %
Lymphocytes Relative: 14 %
Lymphs Abs: 3.9 10*3/uL (ref 0.7–4.0)
MCH: 31 pg (ref 26.0–34.0)
MCHC: 31 g/dL (ref 30.0–36.0)
MCV: 100 fL (ref 80.0–100.0)
Monocytes Absolute: 1.5 10*3/uL — ABNORMAL HIGH (ref 0.1–1.0)
Monocytes Relative: 5 %
Neutro Abs: 21.2 10*3/uL — ABNORMAL HIGH (ref 1.7–7.7)
Neutrophils Relative %: 77 %
Platelets: 308 10*3/uL (ref 150–400)
RBC: 5.03 MIL/uL (ref 4.22–5.81)
RDW: 13.8 % (ref 11.5–15.5)
WBC: 27.6 10*3/uL — ABNORMAL HIGH (ref 4.0–10.5)
nRBC: 0.8 % — ABNORMAL HIGH (ref 0.0–0.2)

## 2022-05-25 LAB — GLUCOSE, CAPILLARY
Glucose-Capillary: 181 mg/dL — ABNORMAL HIGH (ref 70–99)
Glucose-Capillary: 215 mg/dL — ABNORMAL HIGH (ref 70–99)

## 2022-05-25 LAB — URINALYSIS, ROUTINE W REFLEX MICROSCOPIC
Bacteria, UA: NONE SEEN
Bilirubin Urine: NEGATIVE
Glucose, UA: 150 mg/dL — AB
Hgb urine dipstick: NEGATIVE
Ketones, ur: NEGATIVE mg/dL
Leukocytes,Ua: NEGATIVE
Nitrite: NEGATIVE
Protein, ur: 100 mg/dL — AB
Specific Gravity, Urine: 1.026 (ref 1.005–1.030)
pH: 5 (ref 5.0–8.0)

## 2022-05-25 LAB — TROPONIN I (HIGH SENSITIVITY)
Troponin I (High Sensitivity): 15 ng/L (ref <18)
Troponin I (High Sensitivity): 23 ng/L — ABNORMAL HIGH (ref <18)

## 2022-05-25 LAB — BLOOD GAS, VENOUS
Acid-Base Excess: 11.4 mmol/L — ABNORMAL HIGH (ref 0.0–2.0)
Bicarbonate: 44 mmol/L — ABNORMAL HIGH (ref 20.0–28.0)
O2 Saturation: 38.2 %
Patient temperature: 37
pCO2, Ven: 110 mmHg (ref 44–60)
pH, Ven: 7.21 — ABNORMAL LOW (ref 7.25–7.43)
pO2, Ven: <31 mmHg — CL (ref 32–45)

## 2022-05-25 LAB — APTT: aPTT: 27 seconds (ref 24–36)

## 2022-05-25 LAB — BASIC METABOLIC PANEL
Anion gap: 11 (ref 5–15)
BUN: 24 mg/dL — ABNORMAL HIGH (ref 6–20)
CO2: 34 mmol/L — ABNORMAL HIGH (ref 22–32)
Calcium: 8.8 mg/dL — ABNORMAL LOW (ref 8.9–10.3)
Chloride: 94 mmol/L — ABNORMAL LOW (ref 98–111)
Creatinine, Ser: 1.38 mg/dL — ABNORMAL HIGH (ref 0.61–1.24)
GFR, Estimated: >60 mL/min (ref >60)
Glucose, Bld: 193 mg/dL — ABNORMAL HIGH (ref 70–99)
Potassium: 4.9 mmol/L (ref 3.5–5.1)
Sodium: 139 mmol/L (ref 135–145)

## 2022-05-25 LAB — LACTIC ACID, PLASMA
Lactic Acid, Venous: 1.3 mmol/L (ref 0.5–1.9)
Lactic Acid, Venous: 2.8 mmol/L (ref 0.5–1.9)
Lactic Acid, Venous: 2.6 mmol/L (ref 0.5–1.9)

## 2022-05-25 LAB — BRAIN NATRIURETIC PEPTIDE: B Natriuretic Peptide: 67.1 pg/mL (ref 0.0–100.0)

## 2022-05-25 LAB — POTASSIUM: Potassium: 4.3 mmol/L (ref 3.5–5.1)

## 2022-05-25 LAB — CBG MONITORING, ED: Glucose-Capillary: 304 mg/dL — ABNORMAL HIGH (ref 70–99)

## 2022-05-25 LAB — RESP PANEL BY RT-PCR (FLU A&B, COVID) ARPGX2
Influenza A by PCR: NEGATIVE
Influenza B by PCR: NEGATIVE
SARS Coronavirus 2 by RT PCR: NEGATIVE

## 2022-05-25 LAB — PROTIME-INR
INR: 1 (ref 0.8–1.2)
Prothrombin Time: 12.8 seconds (ref 11.4–15.2)

## 2022-05-25 LAB — MRSA NEXT GEN BY PCR, NASAL: MRSA by PCR Next Gen: NOT DETECTED

## 2022-05-25 MED ORDER — MIDAZOLAM HCL 2 MG/2ML IJ SOLN
2.0000 mg | INTRAMUSCULAR | Status: DC | PRN
  Administered 2022-05-26 – 2022-05-27 (×5): 2 mg via INTRAVENOUS
  Filled 2022-05-25 (×5): qty 2

## 2022-05-25 MED ORDER — FENTANYL CITRATE (PF) 100 MCG/2ML IJ SOLN: 50.0000 ug | INTRAMUSCULAR | Status: DC | PRN

## 2022-05-25 MED ORDER — PROPOFOL 1000 MG/100ML IV EMUL
0.0000 ug/kg/min | INTRAVENOUS | Status: DC
  Administered 2022-05-25: 35 ug/kg/min via INTRAVENOUS
  Administered 2022-05-25: 5 ug/kg/min via INTRAVENOUS
  Administered 2022-05-25: 45 ug/kg/min via INTRAVENOUS
  Administered 2022-05-25: 35 ug/kg/min via INTRAVENOUS
  Administered 2022-05-25: 45 ug/kg/min via INTRAVENOUS
  Administered 2022-05-26 (×2): 35 ug/kg/min via INTRAVENOUS
  Administered 2022-05-26: 45 ug/kg/min via INTRAVENOUS
  Administered 2022-05-26 (×2): 35 ug/kg/min via INTRAVENOUS
  Administered 2022-05-26: 45 ug/kg/min via INTRAVENOUS
  Administered 2022-05-26 (×2): 35 ug/kg/min via INTRAVENOUS
  Administered 2022-05-27 (×4): 45 ug/kg/min via INTRAVENOUS
  Administered 2022-05-27: 25 ug/kg/min via INTRAVENOUS
  Administered 2022-05-27 (×2): 45 ug/kg/min via INTRAVENOUS
  Filled 2022-05-25 (×14): qty 100
  Filled 2022-05-25: qty 200
  Filled 2022-05-25 (×2): qty 100

## 2022-05-25 MED ORDER — DOCUSATE SODIUM 100 MG PO CAPS: 100.0000 mg | ORAL_CAPSULE | Freq: Two times a day (BID) | ORAL | Status: DC | PRN

## 2022-05-25 MED ORDER — SODIUM CHLORIDE 0.9 % IV SOLN: INTRAVENOUS | Status: DC

## 2022-05-25 MED ORDER — LACTATED RINGERS IV SOLN: INTRAVENOUS | Status: DC

## 2022-05-25 MED ORDER — SODIUM CHLORIDE 0.9 % IV SOLN
500.0000 mg | INTRAVENOUS | Status: DC
  Administered 2022-05-25 – 2022-05-27 (×3): 500 mg via INTRAVENOUS
  Filled 2022-05-25 (×3): qty 5

## 2022-05-25 MED ORDER — POLYETHYLENE GLYCOL 3350 17 G PO PACK: 17.0000 g | PACK | Freq: Every day | ORAL | Status: DC | PRN

## 2022-05-25 MED ORDER — MIDAZOLAM HCL 2 MG/2ML IJ SOLN
2.0000 mg | INTRAMUSCULAR | Status: DC | PRN
  Administered 2022-05-26: 2 mg via INTRAVENOUS
  Filled 2022-05-25: qty 2

## 2022-05-25 MED ORDER — PROPOFOL 1000 MG/100ML IV EMUL
INTRAVENOUS | Status: AC
  Filled 2022-05-25: qty 100

## 2022-05-25 MED ORDER — FENTANYL CITRATE PF 50 MCG/ML IJ SOSY
50.0000 ug | PREFILLED_SYRINGE | INTRAMUSCULAR | Status: DC | PRN
  Administered 2022-05-25: 100 ug via INTRAVENOUS
  Filled 2022-05-25: qty 2

## 2022-05-25 MED ORDER — SODIUM ZIRCONIUM CYCLOSILICATE 5 G PO PACK: 5.0000 g | PACK | Freq: Two times a day (BID) | ORAL | Status: DC

## 2022-05-25 MED ORDER — ALBUTEROL SULFATE (2.5 MG/3ML) 0.083% IN NEBU
10.0000 mg | INHALATION_SOLUTION | Freq: Once | RESPIRATORY_TRACT | Status: AC
  Administered 2022-05-25: 10 mg via RESPIRATORY_TRACT
  Filled 2022-05-25: qty 12

## 2022-05-25 MED ORDER — IPRATROPIUM BROMIDE 0.02 % IN SOLN
0.5000 mg | Freq: Once | RESPIRATORY_TRACT | Status: AC
  Administered 2022-05-25: 0.5 mg via RESPIRATORY_TRACT
  Filled 2022-05-25: qty 2.5

## 2022-05-25 MED ORDER — ETOMIDATE 2 MG/ML IV SOLN
INTRAVENOUS | Status: AC
  Administered 2022-05-25: 30 mg
  Filled 2022-05-25: qty 10

## 2022-05-25 MED ORDER — HEPARIN SODIUM (PORCINE) 5000 UNIT/ML IJ SOLN
5000.0000 [IU] | Freq: Three times a day (TID) | INTRAMUSCULAR | Status: DC
  Administered 2022-05-25 – 2022-05-31 (×17): 5000 [IU] via SUBCUTANEOUS
  Filled 2022-05-25 (×17): qty 1

## 2022-05-25 MED ORDER — SUCCINYLCHOLINE CHLORIDE 200 MG/10ML IV SOSY
PREFILLED_SYRINGE | INTRAVENOUS | Status: AC
  Administered 2022-05-25: 150 mg
  Filled 2022-05-25: qty 10

## 2022-05-25 MED ORDER — FENTANYL CITRATE PF 50 MCG/ML IJ SOSY: 50.0000 ug | PREFILLED_SYRINGE | INTRAMUSCULAR | Status: DC | PRN

## 2022-05-25 MED ORDER — METHYLPREDNISOLONE SODIUM SUCC 125 MG IJ SOLR
125.0000 mg | Freq: Once | INTRAMUSCULAR | Status: AC
  Administered 2022-05-25: 125 mg via INTRAVENOUS
  Filled 2022-05-25: qty 2

## 2022-05-25 MED ORDER — INSULIN ASPART 100 UNIT/ML IV SOLN
10.0000 [IU] | Freq: Once | INTRAVENOUS | Status: AC
  Administered 2022-05-25: 10 [IU] via INTRAVENOUS
  Filled 2022-05-25: qty 0.1

## 2022-05-25 MED ORDER — SODIUM CHLORIDE 0.9 % IV SOLN
2.0000 g | INTRAVENOUS | Status: AC
  Administered 2022-05-25 – 2022-05-29 (×5): 2 g via INTRAVENOUS
  Filled 2022-05-25 (×6): qty 20

## 2022-05-25 MED ORDER — CHLORHEXIDINE GLUCONATE 0.12% ORAL RINSE (MEDLINE KIT)
15.0000 mL | Freq: Two times a day (BID) | OROMUCOSAL | Status: DC
  Administered 2022-05-25 – 2022-05-27 (×4): 15 mL via OROMUCOSAL

## 2022-05-25 MED ORDER — INSULIN ASPART 100 UNIT/ML IJ SOLN
0.0000 [IU] | INTRAMUSCULAR | Status: DC
  Administered 2022-05-25 – 2022-05-26 (×2): 3 [IU] via SUBCUTANEOUS
  Administered 2022-05-26: 5 [IU] via SUBCUTANEOUS
  Administered 2022-05-26: 2 [IU] via SUBCUTANEOUS
  Administered 2022-05-26 (×2): 5 [IU] via SUBCUTANEOUS
  Administered 2022-05-26: 3 [IU] via SUBCUTANEOUS
  Administered 2022-05-27 (×2): 5 [IU] via SUBCUTANEOUS
  Administered 2022-05-27 (×2): 3 [IU] via SUBCUTANEOUS
  Administered 2022-05-27 (×2): 8 [IU] via SUBCUTANEOUS
  Administered 2022-05-28 (×2): 2 [IU] via SUBCUTANEOUS
  Administered 2022-05-28: 8 [IU] via SUBCUTANEOUS
  Administered 2022-05-28: 3 [IU] via SUBCUTANEOUS
  Administered 2022-05-28: 5 [IU] via SUBCUTANEOUS
  Administered 2022-05-29: 11 [IU] via SUBCUTANEOUS
  Administered 2022-05-29: 8 [IU] via SUBCUTANEOUS
  Administered 2022-05-29 – 2022-05-30 (×6): 3 [IU] via SUBCUTANEOUS
  Administered 2022-05-30: 2 [IU] via SUBCUTANEOUS
  Administered 2022-05-30: 5 [IU] via SUBCUTANEOUS
  Administered 2022-05-30: 2 [IU] via SUBCUTANEOUS
  Administered 2022-05-31 (×2): 3 [IU] via SUBCUTANEOUS

## 2022-05-25 MED ORDER — SODIUM BICARBONATE 8.4 % IV SOLN
50.0000 meq | Freq: Once | INTRAVENOUS | Status: AC
  Administered 2022-05-25: 50 meq via INTRAVENOUS
  Filled 2022-05-25: qty 50

## 2022-05-25 MED ORDER — ORAL CARE MOUTH RINSE
15.0000 mL | OROMUCOSAL | Status: DC
  Administered 2022-05-25 – 2022-05-27 (×20): 15 mL via OROMUCOSAL

## 2022-05-25 MED ORDER — CHLORHEXIDINE GLUCONATE CLOTH 2 % EX PADS
6.0000 | MEDICATED_PAD | Freq: Every day | CUTANEOUS | Status: DC
  Administered 2022-05-26 – 2022-05-27 (×2): 6 via TOPICAL

## 2022-05-25 MED ORDER — ALBUTEROL (5 MG/ML) CONTINUOUS INHALATION SOLN: 10.0000 mg/h | INHALATION_SOLUTION | Freq: Once | RESPIRATORY_TRACT | Status: DC

## 2022-05-25 MED ORDER — METHYLPREDNISOLONE SODIUM SUCC 125 MG IJ SOLR
80.0000 mg | Freq: Two times a day (BID) | INTRAMUSCULAR | Status: DC
  Administered 2022-05-25 – 2022-05-26 (×3): 80 mg via INTRAVENOUS
  Filled 2022-05-25 (×3): qty 2

## 2022-05-25 MED ORDER — LACTATED RINGERS IV BOLUS (SEPSIS)
1000.0000 mL | Freq: Once | INTRAVENOUS | Status: AC
  Administered 2022-05-25: 1000 mL via INTRAVENOUS

## 2022-05-25 MED ORDER — ETOMIDATE 2 MG/ML IV SOLN
INTRAVENOUS | Status: AC
  Filled 2022-05-25: qty 10

## 2022-05-25 MED ORDER — DOCUSATE SODIUM 50 MG/5ML PO LIQD
100.0000 mg | Freq: Two times a day (BID) | ORAL | Status: DC | PRN
  Filled 2022-05-25: qty 10

## 2022-05-25 MED ORDER — LACTATED RINGERS IV BOLUS
1000.0000 mL | Freq: Once | INTRAVENOUS | Status: AC
  Administered 2022-05-25: 1000 mL via INTRAVENOUS

## 2022-05-25 MED ORDER — PANTOPRAZOLE 2 MG/ML SUSPENSION
40.0000 mg | Freq: Every day | ORAL | Status: DC
  Administered 2022-05-25: 40 mg
  Filled 2022-05-25 (×2): qty 20

## 2022-05-25 NOTE — H&P (Signed)
NAME:  Joseph Morales, MRN:  [id number], DOB:  Oct 09, 1971, LOS: 0 ADMISSION DATE:  05/25/2022, CONSULTATION DATE:  05/25/22 REFERRING MD:  Theodoro Grist MD, CHIEF COMPLAINT:   Hypercarbic respiratory failure  History of Present Illness:   51 year old with history of asthma, diastolic heart failure, obesity hypoventilation syndrome admitted with altered mental status, acute hypoxic and hypercarbic respiratory failure.  Preceding symptoms are unclear as family is not present and mother who lives with the patient is at a funeral.  He was brought to the ED by his cousin.  Tried on BiPAP in the ED but failed and was intubated.  PCCM consulted for admission.  Pertinent  Medical History    has a past medical history of Asthma, CHF (congestive heart failure) (HCC), Chronic diastolic heart failure (HCC), COPD (chronic obstructive pulmonary disease) (HCC), Hypertension, Hypoventilation associated with obesity syndrome (HCC), and Obesity.   Significant Hospital Events: Including procedures, antibiotic start and stop dates in addition to other pertinent events     Interim History / Subjective:     Objective   Blood pressure 117/65, pulse (!) 103, temperature (!) 100.7 F (38.2 C), resp. rate 15, height 6\' 3"  (1.905 m), weight (!) 167.8 kg, SpO2 100 %.    Vent Mode: PRVC FiO2 (%):  [60 %-70 %] 60 % Set Rate:  [22 bmp] 22 bmp Vt Set:  [600 mL] 600 mL PEEP:  [5 cmH20] 5 cmH20 Plateau Pressure:  [30 cmH20] 30 cmH20  No intake or output data in the 24 hours ending 05/25/22 1437 Filed Weights   05/25/22 1253  Weight: (!) 167.8 kg    Examination: Gen:      No acute distress, obese HEENT:  EOMI, sclera anicteric Neck:     No masses; no thyromegaly, ET tube Lungs:    Clear to auscultation bilaterally; normal respiratory effort CV:         Regular rate and rhythm; no murmurs Abd:      + bowel sounds; soft, non-tender; no palpable masses, no distension Ext:    No edema; adequate peripheral  perfusion Skin:      Warm and dry; no rash Neuro: Sedated, unresponsive  Labs/imaging reviewed Significant for potassium 3.5, creatinine 1.48 WBC 27.6, hemoglobin 15.6, platelets 308 Chest x-ray shows bilateral patchy opacity  Resolved Hospital Problem list     Assessment & Plan:  Acute respiratory failure with hypoxia and hypercarbia Suspected community-acquired pneumonia, asthma exacerbation Negative for COVID and flu on admission Start ceftriaxone, azithromycin IV Solu-Medrol and nebulizer Follow chest x-ray and ABG  Acute kidney injury, hyperkalemia Fluid for hydration.  Change LR to normal saline Insulin, glucose, Lokelma  Diastolic heart failure Telemetry monitoring.  Best Practice (right click and "Reselect all SmartList Selections" daily)   Diet/type: NPO DVT prophylaxis: prophylactic heparin  GI prophylaxis: PPI Lines: N/A Foley:  N/A Code Status:  full code Last date of multidisciplinary goals of care discussion [] .  Pending  Critical care time:    The patient is critically ill with multiple organ system failure and requires high complexity decision making for assessment and support, frequent evaluation and titration of therapies, advanced monitoring, review of radiographic studies and interpretation of complex data.   Critical Care Time devoted to patient care services, exclusive of separately billable procedures, described in this note is 35 minutes.   05/27/22 MD [facility] See Amion for pager  If no response to pager , please call [phone] until 7pm After 7:00  pm call Elink  [phone] 05/25/2022, 3:42 PM

## 2022-05-25 NOTE — Progress Notes (Signed)
RT NOTE:  Pt was placed on BiPAP initially due to increased WOB, pt failed BiPAP and was ultimately intubated.

## 2022-05-25 NOTE — ED Triage Notes (Signed)
Pt reports 2-3 days of SOB, unrelieved by home inhalers. Pt reports hx of COPD and CHF. States he's having worsening swelling in his feet. During triage pt noted to be 59% on RA. Taken to resusc A and MD notified.

## 2022-05-25 NOTE — Sepsis Progress Note (Signed)
Code Sepsis protocol being monitored be eLink 

## 2022-05-25 NOTE — Progress Notes (Addendum)
eLink Physician-Brief Progress Note Patient Name: Joseph Morales DOB: September 26, 1971 MRN: [id number]   Date of Service  05/25/2022  HPI/Events of Note  Notified of last glucose check at 181.  Pt is NPO. He is on victoza at home and is also getting IV steroids. Admission glucose was elevated at 261.  eICU Interventions  Start on insulin sliding scale q4hrs.      Intervention Category Intermediate Interventions: Hyperglycemia - evaluation and treatment  Larinda Buttery 05/25/2022, 7:58 PM  8:31 PM Notified of change in ETT placement at the lip.   Plan> Confirm with CXR before adjusting.   9:47 PM CXR reviewed and ETT is about 7cm above the carina.  Lactate trending from from 2.8 --> 2.6.   Plan> Advance ETT by atleast 2cm.   12:22 AM Notified of bright red blood from the NGT.  There is small amount in the tubing.   Plan> Start on protonix 40mg  IV BID.

## 2022-05-25 NOTE — ED Notes (Signed)
ED TO INPATIENT HANDOFF REPORT  Name/Age/Gender Joseph Morales 51 y.o. male  Code Status    Code Status Orders  (From admission, onward)           Start     Ordered   05/25/22 1529  Full code  Continuous        05/25/22 1529           Code Status History     Date Active Date Inactive Code Status Order ID Comments User Context   12/24/2021 0344 12/26/2021 1641 Full Code [id number]  Eduard Clos, MD ED   05/18/2021 1835 05/21/2021 1928 Full Code [id number]  Tomma Lightning, MD ED   10/16/2020 1904 10/25/2020 1724 Full Code [id number]  Charlsie Quest, MD ED   06/21/2020 1957 06/26/2020 1940 Full Code [id number]  Rolly Salter, MD ED       Home/SNF/Other Home  Chief Complaint Acute respiratory failure (HCC) [J96.00]  Level of Care/Admitting Diagnosis ED Disposition     ED Disposition  Admit   Condition  --   Comment  Hospital Area: [facility] [[id number]]  Level of Care: ICU [6]  May admit patient to [facility] or [facility] if equivalent level of care is available:: Yes  Covid Evaluation: Asymptomatic - no recent exposure (last 10 days) testing not required  Diagnosis: Acute respiratory failure (HCC) [518.81.ICD-9-CM]  Admitting Physician: Chilton Greathouse [[id number]]  Attending Physician: Chilton Greathouse [[id number]]  Estimated length of stay: past midnight tomorrow  Certification:: I certify this patient will need inpatient services for at least 2 midnights          Medical History Past Medical History:  Diagnosis Date   Asthma    CHF (congestive heart failure) (HCC)    Chronic diastolic heart failure (HCC)    COPD (chronic obstructive pulmonary disease) (HCC)    Hypertension    Hypoventilation associated with obesity syndrome (HCC)    Obesity     Allergies No Known Allergies  IV Location/Drains/Wounds Patient Lines/Drains/Airways Status     Active Line/Drains/Airways     Name Placement date Placement time Site Days    Peripheral IV 05/25/22 20 G Anterior;Distal;Left;Upper Arm 05/25/22  1315  Arm  less than 1   Peripheral IV 05/25/22 20 G Right Antecubital 05/25/22  1300  Antecubital  less than 1   NG/OG Vented/Dual Lumen 18 Fr. Left nare 05/25/22  1320  Left nare  less than 1   Urethral Catheter Amber Jae B., RN Temperature probe 14 Fr. 05/25/22  1350  Temperature probe  less than 1   Airway 7.5 mm 05/25/22  1315  -- less than 1            Labs/Imaging Results for orders placed or performed during the hospital encounter of 05/25/22 (from the past 48 hour(s))  CBG monitoring, ED     Status: Abnormal   Collection Time: 05/25/22  1:09 PM  Result Value Ref Range   Glucose-Capillary 304 (H) 70 - 99 mg/dL    Comment: Glucose reference range applies only to samples taken after fasting for at least 8 hours.  CBC with Differential     Status: Abnormal   Collection Time: 05/25/22  1:34 PM  Result Value Ref Range   WBC 27.6 (H) 4.0 - 10.5 K/uL   RBC 5.03 4.22 - 5.81 MIL/uL   Hemoglobin 15.6 13.0 - 17.0 g/dL   HCT 29.9 24.2 - 68.3 %   MCV 100.0 80.0 -  100.0 fL   MCH 31.0 26.0 - 34.0 pg   MCHC 31.0 30.0 - 36.0 g/dL   RDW 28.3 66.2 - 94.7 %   Platelets 308 150 - 400 K/uL   nRBC 0.8 (H) 0.0 - 0.2 %   Neutrophils Relative % 77 %   Neutro Abs 21.2 (H) 1.7 - 7.7 K/uL   Lymphocytes Relative 14 %   Lymphs Abs 3.9 0.7 - 4.0 K/uL   Monocytes Relative 5 %   Monocytes Absolute 1.5 (H) 0.1 - 1.0 K/uL   Eosinophils Relative 1 %   Eosinophils Absolute 0.2 0.0 - 0.5 K/uL   Basophils Relative 1 %   Basophils Absolute 0.2 (H) 0.0 - 0.1 K/uL   Immature Granulocytes 2 %   Abs Immature Granulocytes 0.66 (H) 0.00 - 0.07 K/uL   Reactive, Benign Lymphocytes PRESENT    Polychromasia PRESENT     Comment: Performed at [facility], [address], [place], [place] [address]  Brain natriuretic peptide     Status: None   Collection Time: 05/25/22  1:45 PM  Result Value Ref Range   B Natriuretic Peptide  67.1 0.0 - 100.0 pg/mL    Comment: Performed at [facility], [address] [place], [place] [address]  Blood gas, venous     Status: Abnormal   Collection Time: 05/25/22  1:45 PM  Result Value Ref Range   pH, Ven 7.21 (L) 7.25 - 7.43   pCO2, Ven 110 (HH) 44 - 60 mmHg    Comment: CRITICAL RESULT CALLED TO, READ BACK BY AND VERIFIED WITH: CALLED TO ED RN AMBER J @ 1406 ASHA B    pO2, Ven <31 (LL) 32 - 45 mmHg    Comment: CRITICAL RESULT CALLED TO, READ BACK BY AND VERIFIED WITH: CALLED TO ED RN AMBER J @ 1406 ASHA B    Bicarbonate 44.0 (H) 20.0 - 28.0 mmol/L   Acid-Base Excess 11.4 (H) 0.0 - 2.0 mmol/L   O2 Saturation 38.2 %   Patient temperature 37.0     Comment: Performed at [facility], [address], [place], [place] [address]  Comprehensive metabolic panel     Status: Abnormal   Collection Time: 05/25/22  1:45 PM  Result Value Ref Range   Sodium 136 135 - 145 mmol/L   Potassium 6.5 (HH) 3.5 - 5.1 mmol/L    Comment: SLIGHT HEMOLYSIS CRITICAL RESULT CALLED TO, READ BACK BY AND VERIFIED WITH: BIENSANG, A RN AT 1459 05/25/22 BY TIBBITTS, K    Chloride 93 (L) 98 - 111 mmol/L   CO2 37 (H) 22 - 32 mmol/L   Glucose, Bld 261 (H) 70 - 99 mg/dL    Comment: Glucose reference range applies only to samples taken after fasting for at least 8 hours.   BUN 26 (H) 6 - 20 mg/dL   Creatinine, Ser 7.51 (H) 0.61 - 1.24 mg/dL   Calcium 8.6 (L) 8.9 - 10.3 mg/dL   Total Protein 8.7 (H) 6.5 - 8.1 g/dL   Albumin 4.3 3.5 - 5.0 g/dL   AST 49 (H) 15 - 41 U/L   ALT 49 (H) 0 - 44 U/L   Alkaline Phosphatase 81 38 - 126 U/L   Total Bilirubin 1.4 (H) 0.3 - 1.2 mg/dL   GFR, Estimated 58 (L) >60 mL/min    Comment: (NOTE) Calculated using the CKD-EPI Creatinine Equation (2021)    Anion gap 6 5 - 15    Comment: Performed at  [facility], [address], [place], [place] [address]  Troponin I (High Sensitivity)     Status: None   Collection  Time: 05/25/22  1:45 PM  Result Value Ref Range   Troponin I (High Sensitivity) 15 <18 ng/L    Comment: (NOTE) Elevated high sensitivity troponin I (hsTnI) values and significant  changes across serial measurements may suggest ACS but many other  chronic and acute conditions are known to elevate hsTnI results.  Refer to the Links section for chest pain algorithms and additional  guidance. Performed at [facility], [address] [place], [place] [address]   Lactic acid, plasma     Status: None   Collection Time: 05/25/22  2:10 PM  Result Value Ref Range   Lactic Acid, Venous 1.3 0.5 - 1.9 mmol/L    Comment: Performed at [facility], [address], [place], [place] [address]  Protime-INR     Status: None   Collection Time: 05/25/22  2:11 PM  Result Value Ref Range   Prothrombin Time 12.8 11.4 - 15.2 seconds   INR 1.0 0.8 - 1.2    Comment: (NOTE) INR goal varies based on device and disease states. Performed at [facility], [address] [place], [place] [address]   APTT     Status: None   Collection Time: 05/25/22  2:11 PM  Result Value Ref Range   aPTT 27 24 - 36 seconds    Comment: Performed at [facility], [address] [place], [place] [address]  Urinalysis, Routine w reflex microscopic     Status: Abnormal   Collection Time: 05/25/22  2:12 PM  Result Value Ref Range   Color, Urine YELLOW YELLOW   APPearance CLEAR CLEAR   Specific Gravity, Urine 1.026 1.005 - 1.030   pH 5.0 5.0 - 8.0   Glucose, UA 150 (A) NEGATIVE mg/dL   Hgb urine dipstick NEGATIVE NEGATIVE   Bilirubin Urine NEGATIVE NEGATIVE   Ketones, ur NEGATIVE NEGATIVE mg/dL   Protein, ur 130 (A) NEGATIVE mg/dL   Nitrite NEGATIVE NEGATIVE   Leukocytes,Ua NEGATIVE NEGATIVE   RBC / HPF 0-5 0 - 5 RBC/hpf   WBC, UA 0-5 0 - 5 WBC/hpf   Bacteria, UA NONE SEEN NONE SEEN   Squamous Epithelial / LPF 0-5 0 - 5   Mucus PRESENT     Hyaline Casts, UA PRESENT     Comment: Performed at [facility], [address] [place], [place] [address]  Resp Panel by RT-PCR (Flu A&B, Covid) Anterior Nasal Swab     Status: None   Collection Time: 05/25/22  2:18 PM   Specimen: Anterior Nasal Swab  Result Value Ref Range   SARS Coronavirus 2 by RT PCR NEGATIVE NEGATIVE    Comment: (NOTE) SARS-CoV-2 target nucleic acids are NOT DETECTED.  The SARS-CoV-2 RNA is generally detectable in upper respiratory specimens during the acute phase of infection. The lowest concentration of SARS-CoV-2 viral copies this assay can detect is 138 copies/mL. A negative result does not preclude SARS-Cov-2 infection and should not be used as the sole basis for treatment or other patient management decisions. A negative result may occur with  improper specimen collection/handling, submission of specimen other than nasopharyngeal swab, presence of viral mutation(s) within the areas targeted by this assay, and inadequate number of viral copies(<138 copies/mL). A negative result must be combined with clinical observations, patient history, and epidemiological information. The expected result  is Negative.  Fact Sheet for Patients:  BloggerCourse.comhttps://www.fda.gov/media/152166/download  Fact Sheet for Healthcare Providers:  SeriousBroker.ithttps://www.fda.gov/media/152162/download  This test is no t yet approved or cleared by the [place] FDA and  has been authorized for detection and/or diagnosis of SARS-CoV-2 by FDA under an Emergency Use Authorization (EUA). This EUA will remain  in effect (meaning this test can be used) for the duration of the COVID-19 declaration under Section 564(b)(1) of the Act, 21 U.S.C.section 360bbb-3(b)(1), unless the authorization is terminated  or revoked sooner.       Influenza A by PCR NEGATIVE NEGATIVE   Influenza B by PCR NEGATIVE NEGATIVE    Comment: (NOTE) The Xpert Xpress SARS-CoV-2/FLU/RSV plus assay is intended  as an aid in the diagnosis of influenza from Nasopharyngeal swab specimens and should not be used as a sole basis for treatment. Nasal washings and aspirates are unacceptable for Xpert Xpress SARS-CoV-2/FLU/RSV testing.  Fact Sheet for Patients: BloggerCourse.comhttps://www.fda.gov/media/152166/download  Fact Sheet for Healthcare Providers: SeriousBroker.ithttps://www.fda.gov/media/152162/download  This test is not yet approved or cleared by the [place] FDA and has been authorized for detection and/or diagnosis of SARS-CoV-2 by FDA under an Emergency Use Authorization (EUA). This EUA will remain in effect (meaning this test can be used) for the duration of the COVID-19 declaration under Section 564(b)(1) of the Act, 21 U.S.C. section 360bbb-3(b)(1), unless the authorization is terminated or revoked.  Performed at [facility], [address] [place], [place] [address]   Blood gas, arterial     Status: Abnormal   Collection Time: 05/25/22  2:30 PM  Result Value Ref Range   FIO2 60 %   Delivery systems VENTILATOR    Mode PRESSURE REGULATED VOLUME CONTROL    MECHVT 600 mL   RATE 22 resp/min   PEEP 5 cm H20   pH, Arterial 7.44 7.35 - 7.45   pCO2 arterial 63 (H) 32 - 48 mmHg   pO2, Arterial 86 83 - 108 mmHg   Bicarbonate 42.8 (H) 20.0 - 28.0 mmol/L   Acid-Base Excess 15.5 (H) 0.0 - 2.0 mmol/L   O2 Saturation 97.2 %   Patient temperature 100.7    Collection site LEFT RADIAL    Drawn by [id number]    Allens test (pass/fail) PASS PASS    Comment: Performed at [facility], [address] [place], [place] [address]   DG Chest Port 1 View  Result Date: 05/25/2022 CLINICAL DATA:  Status post intubation. EXAM: PORTABLE CHEST 1 VIEW COMPARISON:  12/24/2021 FINDINGS: Normal sized heart. Endotracheal tube in satisfactory position. Nasogastric tube extending into the stomach. Mild patchy density at both lung bases, greater on the left. Linear density in the right mid to lower  lung zone. No definite pleural fluid seen. IMPRESSION: 1. Satisfactory position of the endotracheal tube. 2. Bibasilar patchy atelectasis or pneumonia, greater on the left. 3. Mild right perihilar linear atelectasis. Electronically Signed   By: Beckie SaltsSteven  Reid M.D.   On: 05/25/2022 13:56   DG Abd Portable 1 View  Result Date: 05/25/2022 CLINICAL DATA:  Status post NG tube placement EXAM: PORTABLE ABDOMEN - 1 VIEW COMPARISON:  None Available. FINDINGS: Esophagogastric tube with tip and side port below the diaphragm. Paucity of included bowel gas. No obvious free air. IMPRESSION: Esophagogastric tube with tip and side port below the diaphragm. Paucity of included bowel gas. No obvious free air. Electronically Signed   By: Jearld LeschAlex D Bibbey M.D.   On: 05/25/2022 14:07    Pending Labs Unresulted  Labs (From admission, onward)     Start     Ordered   05/26/22 0500  Triglycerides  (propofol (DIPRIVAN))  Every 72 hours,   R     Comments: While on propofol (DIPRIVAN)    05/25/22 1306   05/25/22 1358  Lactic acid, plasma  (Septic presentation on arrival (screening labs, nursing and treatment orders for obvious sepsis))  STAT Now then every 2 hours,   R (with STAT occurrences)      05/25/22 1358   05/25/22 1358  Blood Culture (routine x 2)  (Septic presentation on arrival (screening labs, nursing and treatment orders for obvious sepsis))  BLOOD CULTURE X 2,   STAT      05/25/22 1358            Vitals/Pain Today's Vitals   05/25/22 1455 05/25/22 1500 05/25/22 1505 05/25/22 1515  BP: 113/70 115/70 118/76 119/72  Pulse: (!) 103 (!) 104 (!) 102 (!) 102  Resp: (!) 9  Temp: (!) 100.7 F (38.2 C) (!) 100.7 F (38.2 C) (!) 100.7 F (38.2 C) (!) 100.5 F (38.1 C)  SpO2: 100% 100% 100% 100%  Weight:      Height:      PainSc:        Isolation Precautions No active isolations  Medications Medications  etomidate (AMIDATE) 2 MG/ML injection (has no administration in time range)  propofol  (DIPRIVAN) 1000 MG/100ML infusion (35 mcg/kg/min  167.8 kg Intravenous New Bag/Given 05/25/22 1527)  fentaNYL (SUBLIMAZE) injection 50 mcg (has no administration in time range)  fentaNYL (SUBLIMAZE) injection 50-200 mcg (100 mcg Intravenous Given 05/25/22 1433)  midazolam (VERSED) injection 2 mg (has no administration in time range)  midazolam (VERSED) injection 2 mg (has no administration in time range)  propofol (DIPRIVAN) 1000 MG/100ML infusion (has no administration in time range)  lactated ringers infusion ( Intravenous New Bag/Given 05/25/22 1442)  cefTRIAXone (ROCEPHIN) 2 g in sodium chloride 0.9 % 100 mL IVPB (0 g Intravenous Stopped 05/25/22 1507)  azithromycin (ZITHROMAX) 500 mg in sodium chloride 0.9 % 250 mL IVPB (500 mg Intravenous New Bag/Given 05/25/22 1505)  polyethylene glycol (MIRALAX / GLYCOLAX) packet 17 g (has no administration in time range)  heparin injection 5,000 Units (has no administration in time range)  pantoprazole sodium (PROTONIX) 40 mg/20 mL oral suspension 40 mg (has no administration in time range)  docusate (COLACE) 50 MG/5ML liquid 100 mg (has no administration in time range)  methylPREDNISolone sodium succinate (SOLU-MEDROL) 125 mg/2 mL injection 80 mg (has no administration in time range)  succinylcholine (ANECTINE) 200 MG/10ML syringe (150 mg  Given 05/25/22 1313)  etomidate (AMIDATE) 2 MG/ML injection (30 mg  Given 05/25/22 1310)  methylPREDNISolone sodium succinate (SOLU-MEDROL) 125 mg/2 mL injection 125 mg (125 mg Intravenous Given 05/25/22 1359)  ipratropium (ATROVENT) nebulizer solution 0.5 mg (0.5 mg Nebulization Given 05/25/22 1347)  albuterol (PROVENTIL) (2.5 MG/3ML) 0.083% nebulizer solution 10 mg (10 mg Nebulization Given 05/25/22 1347)  lactated ringers bolus 1,000 mL (1,000 mLs Intravenous New Bag/Given 05/25/22 1439)    Mobility non-ambulatory

## 2022-05-25 NOTE — ED Provider Notes (Signed)
[facility] DEPT Provider Note   CSN: [id number] Arrival date & time: 05/25/22  1241     History  Chief Complaint  Patient presents with   Respiratory Distress    Joseph Morales is a 51 y.o. male.  HPI     51 year old male comes in with chief complaint of shortness of breath.  Patient has history of COPD, CHF.  Level 5 caveat for altered mental status.  I spoke with patient's cousin, he indicates that he went to check on his cousin this morning.  Patient's mother asked him to stay with him, as the patient was not doing well.  Patient was on CPAP machine.  Ultimately the patient asked him to bring him to the ER, which is what he did.  Patient was able to give history in triage, but became more confused and lethargic.  Mother is at the funeral, not able to provide any other additional history, but cousin stated that apparently patient has been sick for the last 3 days.  Home Medications Prior to Admission medications   Medication Sig Start Date End Date Taking? Authorizing Provider  albuterol (PROVENTIL) (2.5 MG/3ML) 0.083% nebulizer solution Use 1 vial (2.5 mg total) by nebulization every 2 (two) hours as needed for wheezing. 12/26/21   Geradine Girt, DO  albuterol (VENTOLIN HFA) 108 (90 Base) MCG/ACT inhaler Inhale 2 puffs into the lungs every 6 (six) hours as needed for wheezing or shortness of breath.    [provider]  budesonide-formoterol (SYMBICORT) 160-4.5 MCG/ACT inhaler INHALE 2 PUFFS INTO THE LUNGS IN THE MORNING AND AT BEDTIME. Patient taking differently: Inhale 2 puffs into the lungs 2 (two) times daily. 06/18/21 06/18/22  Martyn Ehrich, NP  carvedilol (COREG) 12.5 MG tablet Take 12.5 mg by mouth 2 (two) times daily. 12/16/21   [provider]  clobetasol cream (TEMOVATE) 0.05 % SMARTSIG:sparingly Topical 1-2 Times Daily PRN 05/15/22   [provider]  [employer] MELATONIN 5 MG TABS Take 5 mg by mouth at bedtime  as needed. 05/15/22   [provider]  fluticasone (FLONASE) 50 MCG/ACT nasal spray Place 1 spray into both nostrils daily. 05/15/22   [provider]  furosemide (LASIX) 40 MG tablet Take 80 mg by mouth 2 (two) times daily. 08/05/21   [provider]  losartan (COZAAR) 25 MG tablet Take 25 mg by mouth daily. 12/16/21   [provider]  OXYGEN Inhale 2-4 L into the lungs at bedtime.    [provider]  rosuvastatin (CRESTOR) 5 MG tablet Take 1 tablet (5 mg total) by mouth daily. 02/06/22 05/07/22  Tobb, Kardie, DO  VIAGRA 25 MG tablet SMARTSIG:1 Tablet(s) By Mouth 05/15/22   [provider]  VICTOZA 18 MG/3ML SOPN Inject 3 mg into the skin daily. 11/14/21   [provider]  WELLBUTRIN XL 300 MG 24 hr tablet Take 300 mg by mouth daily. 11/14/21   [provider]      Allergies    Patient has no known allergies.    Review of Systems   Review of Systems  Physical Exam Updated Vital Signs BP 118/76   Pulse (!) 102   Temp (!) 100.7 F (38.2 C)   Resp 20   Ht 6\' 3"  (1.905 m)   Wt (!) 167.8 kg   SpO2 100%   BMI 46.25 kg/m  Physical Exam Vitals and nursing note reviewed.  Constitutional:      Appearance: He is well-developed.  HENT:  Head: Atraumatic.  Eyes:     Extraocular Movements: Extraocular movements intact.     Pupils: Pupils are equal, round, and reactive to light.  Cardiovascular:     Rate and Rhythm: Tachycardia present.  Pulmonary:     Comments: Tachypneic, hardly moving any air Musculoskeletal:     Cervical back: Neck supple.  Skin:    General: Skin is warm.  Neurological:     Mental Status: He is alert. He is disoriented.    ED Results / Procedures / Treatments   Labs (all labs ordered are listed, but only abnormal results are displayed) Labs Reviewed  CBC WITH DIFFERENTIAL/PLATELET - Abnormal; Notable for the following components:      Result Value   WBC 27.6 (*)    nRBC 0.8 (*)     Neutro Abs 21.2 (*)    Monocytes Absolute 1.5 (*)    Basophils Absolute 0.2 (*)    Abs Immature Granulocytes 0.66 (*)    All other components within normal limits  BLOOD GAS, ARTERIAL - Abnormal; Notable for the following components:   pCO2 arterial 63 (*)    Bicarbonate 42.8 (*)    Acid-Base Excess 15.5 (*)    All other components within normal limits  BLOOD GAS, VENOUS - Abnormal; Notable for the following components:   pH, Ven 7.21 (*)    pCO2, Ven 110 (*)    pO2, Ven <31 (*)    Bicarbonate 44.0 (*)    Acid-Base Excess 11.4 (*)    All other components within normal limits  COMPREHENSIVE METABOLIC PANEL - Abnormal; Notable for the following components:   Potassium 6.5 (*)    Chloride 93 (*)    CO2 37 (*)    Glucose, Bld 261 (*)    BUN 26 (*)    Creatinine, Ser 1.48 (*)    Calcium 8.6 (*)    Total Protein 8.7 (*)    AST 49 (*)    ALT 49 (*)    Total Bilirubin 1.4 (*)    GFR, Estimated 58 (*)    All other components within normal limits  URINALYSIS, ROUTINE W REFLEX MICROSCOPIC - Abnormal; Notable for the following components:   Glucose, UA 150 (*)    Protein, ur 100 (*)    All other components within normal limits  CBG MONITORING, ED - Abnormal; Notable for the following components:   Glucose-Capillary 304 (*)    All other components within normal limits  RESP PANEL BY RT-PCR (FLU A&B, COVID) ARPGX2  CULTURE, BLOOD (ROUTINE X 2)  CULTURE, BLOOD (ROUTINE X 2)  LACTIC ACID, PLASMA  PROTIME-INR  APTT  BRAIN NATRIURETIC PEPTIDE  LACTIC ACID, PLASMA  TROPONIN I (HIGH SENSITIVITY)  TROPONIN I (HIGH SENSITIVITY)    EKG EKG Interpretation  Date/Time:  Sunday May 25 2022 12:48:40 EDT Ventricular Rate:  123 PR Interval:  126 QRS Duration: 94 QT Interval:  324 QTC Calculation: 464 R Axis:   98 Text Interpretation: Sinus tachycardia Consider right ventricular hypertrophy No acute changes No significant change since last tracing Confirmed by Varney Biles [id number]) on  05/25/2022 3:03:23 PM  Radiology DG Chest Port 1 View  Result Date: 05/25/2022 CLINICAL DATA:  Status post intubation. EXAM: PORTABLE CHEST 1 VIEW COMPARISON:  12/24/2021 FINDINGS: Normal sized heart. Endotracheal tube in satisfactory position. Nasogastric tube extending into the stomach. Mild patchy density at both lung bases, greater on the left. Linear density in the right mid to lower lung zone. No definite pleural fluid seen.  IMPRESSION: 1. Satisfactory position of the endotracheal tube. 2. Bibasilar patchy atelectasis or pneumonia, greater on the left. 3. Mild right perihilar linear atelectasis. Electronically Signed   By: Claudie Revering M.D.   On: 05/25/2022 13:56   DG Abd Portable 1 View  Result Date: 05/25/2022 CLINICAL DATA:  Status post NG tube placement EXAM: PORTABLE ABDOMEN - 1 VIEW COMPARISON:  None Available. FINDINGS: Esophagogastric tube with tip and side port below the diaphragm. Paucity of included bowel gas. No obvious free air. IMPRESSION: Esophagogastric tube with tip and side port below the diaphragm. Paucity of included bowel gas. No obvious free air. Electronically Signed   By: Delanna Ahmadi M.D.   On: 05/25/2022 14:07    Procedures Procedure Name: Intubation Date/Time: 05/25/2022 3:02 PM Performed by: Varney Biles, MD Pre-anesthesia Checklist: Patient identified, Patient being monitored, Emergency Drugs available, Timeout performed and Suction available Oxygen Delivery Method: Non-rebreather mask Preoxygenation: Pre-oxygenation with 100% oxygen Induction Type: Rapid sequence Ventilation: Mask ventilation without difficulty Laryngoscope Size: Glidescope and 3 Grade View: Grade III Tube size: 7.5 mm Number of attempts: 1 Placement Confirmation: ETT inserted through vocal cords under direct vision, CO2 detector and Breath sounds checked- equal and bilateral Secured at: 26 cm Difficulty Due To: Difficulty was unanticipated    .Critical Care Performed by: Varney Biles, MD Authorized by: Varney Biles, MD   Critical care provider statement:    Critical care time (minutes):  86   Critical care was necessary to treat or prevent imminent or life-threatening deterioration of the following conditions:  Respiratory failure and CNS failure or compromise   Critical care was time spent personally by me on the following activities:  Development of treatment plan with patient or surrogate, discussions with consultants, evaluation of patient's response to treatment, examination of patient, ordering and review of laboratory studies, ordering and review of radiographic studies, ordering and performing treatments and interventions, pulse oximetry, re-evaluation of patient's condition and review of old charts    Medications Ordered in ED Medications  etomidate (AMIDATE) 2 MG/ML injection (has no administration in time range)  propofol (DIPRIVAN) 1000 MG/100ML infusion (30 mcg/kg/min  167.8 kg Intravenous Rate/Dose Change 05/25/22 1429)  fentaNYL (SUBLIMAZE) injection 50 mcg (has no administration in time range)  fentaNYL (SUBLIMAZE) injection 50-200 mcg (100 mcg Intravenous Given 05/25/22 1433)  midazolam (VERSED) injection 2 mg (has no administration in time range)  midazolam (VERSED) injection 2 mg (has no administration in time range)  propofol (DIPRIVAN) 1000 MG/100ML infusion (has no administration in time range)  lactated ringers infusion ( Intravenous New Bag/Given 05/25/22 1442)  lactated ringers bolus 1,000 mL (1,000 mLs Intravenous New Bag/Given 05/25/22 1439)  cefTRIAXone (ROCEPHIN) 2 g in sodium chloride 0.9 % 100 mL IVPB (0 g Intravenous Stopped 05/25/22 1507)  azithromycin (ZITHROMAX) 500 mg in sodium chloride 0.9 % 250 mL IVPB (500 mg Intravenous New Bag/Given 05/25/22 1505)  succinylcholine (ANECTINE) 200 MG/10ML syringe (150 mg  Given 05/25/22 1313)  etomidate (AMIDATE) 2 MG/ML injection (30 mg  Given 05/25/22 1310)  methylPREDNISolone sodium succinate  (SOLU-MEDROL) 125 mg/2 mL injection 125 mg (125 mg Intravenous Given 05/25/22 1359)  ipratropium (ATROVENT) nebulizer solution 0.5 mg (0.5 mg Nebulization Given 05/25/22 1347)  albuterol (PROVENTIL) (2.5 MG/3ML) 0.083% nebulizer solution 10 mg (10 mg Nebulization Given 05/25/22 1347)    ED Course/ Medical Decision Making/ A&P  Medical Decision Making Amount and/or Complexity of Data Reviewed Labs: ordered. Radiology: ordered. ECG/medicine tests: ordered.  Risk Prescription drug management. Decision regarding hospitalization.   This patient presents to the ED with chief complaint(s) of Altered mental status in the setting of acute shortness of breath with pertinent past medical history of COPD, CHF which further complicates the presenting complaint. The complaint involves an extensive differential diagnosis and also carries with it a high risk of complications and morbidity.    The differential diagnosis includes: Acute COPD exacerbation with hypercapnic and hypoxic respiratory failure, ACS, pleural effusion, pulmonary edema secondary to hypertensive emergency.  Initially, patient is noted to be confused.  He is arousable to noxious stimuli.  He is not really following commands.  He arrived to the ER and was able to provide some history.  We tried to place patient on BiPAP, but he was pulling only about 100 cc of tidal volume.  Not an ideal candidate for BiPAP resuscitation, therefore we decided to proceed with intubation.   Intubation was successful on first attempt.  However patient's ET tube was leaking, likely because of damage to the cuff.  ET tube was exchanged over bougie,  Solu-Medrol given.  Patient's work-up reveals elevated white count.  He also has a low-grade fever.  X-ray visualized and interpreted independently.  There appears to be increased opacity, concerns for pneumonia.  Code sepsis activated, patient given broad-spectrum antibiotics to cover for  pneumonia.   Additional history obtained: Additional history obtained from family Records reviewed previous admission documents  Independent labs interpretation:  The following labs were independently interpreted: Venous blood gas that shows hypercapnic respiratory failure, white count with elevated WBC.  Independent visualization of imaging: - I independently visualized the following imaging with scope of interpretation limited to determining acute life threatening conditions related to emergency care: x ray of the chest, which revealed evidence of pneumonia  Treatment and Reassessment: ICU to admit. Patient is responding well to went management and sedation.  Consultation: - Consulted or discussed management/test interpretation w/ external professional: ICU team, they will admit.     Final Clinical Impression(s) / ED Diagnoses Final diagnoses:  Acute respiratory failure with hypoxia ([place])  COPD exacerbation ([place])    Rx / DC Orders ED Discharge Orders     None         Varney Biles, MD 05/25/22 [phone]

## 2022-05-25 NOTE — Plan of Care (Signed)
  Problem: Safety: Goal: Non-violent Restraint(s) Outcome: Progressing   

## 2022-05-26 DIAGNOSIS — J9601 Acute respiratory failure with hypoxia: Secondary | ICD-10-CM

## 2022-05-26 DIAGNOSIS — J9602 Acute respiratory failure with hypercapnia: Secondary | ICD-10-CM

## 2022-05-26 LAB — CBC
HCT: 44.5 % (ref 39.0–52.0)
Hemoglobin: 14.2 g/dL (ref 13.0–17.0)
MCH: 30.3 pg (ref 26.0–34.0)
MCHC: 31.9 g/dL (ref 30.0–36.0)
MCV: 95.1 fL (ref 80.0–100.0)
Platelets: 246 10*3/uL (ref 150–400)
RBC: 4.68 MIL/uL (ref 4.22–5.81)
RDW: 14.1 % (ref 11.5–15.5)
WBC: 24.8 10*3/uL — ABNORMAL HIGH (ref 4.0–10.5)
nRBC: 0.1 % (ref 0.0–0.2)

## 2022-05-26 LAB — GLUCOSE, CAPILLARY
Glucose-Capillary: 140 mg/dL — ABNORMAL HIGH (ref 70–99)
Glucose-Capillary: 195 mg/dL — ABNORMAL HIGH (ref 70–99)
Glucose-Capillary: 197 mg/dL — ABNORMAL HIGH (ref 70–99)
Glucose-Capillary: 200 mg/dL — ABNORMAL HIGH (ref 70–99)
Glucose-Capillary: 207 mg/dL — ABNORMAL HIGH (ref 70–99)
Glucose-Capillary: 228 mg/dL — ABNORMAL HIGH (ref 70–99)

## 2022-05-26 LAB — BASIC METABOLIC PANEL
Anion gap: 8 (ref 5–15)
BUN: 23 mg/dL — ABNORMAL HIGH (ref 6–20)
CO2: 34 mmol/L — ABNORMAL HIGH (ref 22–32)
Calcium: 8.9 mg/dL (ref 8.9–10.3)
Chloride: 98 mmol/L (ref 98–111)
Creatinine, Ser: 1.07 mg/dL (ref 0.61–1.24)
GFR, Estimated: >60 mL/min (ref >60)
Glucose, Bld: 212 mg/dL — ABNORMAL HIGH (ref 70–99)
Potassium: 4.3 mmol/L (ref 3.5–5.1)
Sodium: 140 mmol/L (ref 135–145)

## 2022-05-26 LAB — HEMOGLOBIN A1C
Hgb A1c MFr Bld: 6.4 % — ABNORMAL HIGH (ref 4.8–5.6)
Mean Plasma Glucose: 136.98 mg/dL

## 2022-05-26 LAB — MAGNESIUM: Magnesium: 2 mg/dL (ref 1.7–2.4)

## 2022-05-26 LAB — LACTIC ACID, PLASMA: Lactic Acid, Venous: 1.9 mmol/L (ref 0.5–1.9)

## 2022-05-26 LAB — TRIGLYCERIDES: Triglycerides: 192 mg/dL — ABNORMAL HIGH

## 2022-05-26 LAB — POTASSIUM
Potassium: 4.5 mmol/L (ref 3.5–5.1)
Potassium: 4.3 mmol/L (ref 3.5–5.1)

## 2022-05-26 LAB — PHOSPHORUS: Phosphorus: 3.7 mg/dL (ref 2.5–4.6)

## 2022-05-26 MED ORDER — ARFORMOTEROL TARTRATE 15 MCG/2ML IN NEBU
15.0000 ug | INHALATION_SOLUTION | Freq: Two times a day (BID) | RESPIRATORY_TRACT | Status: DC
  Administered 2022-05-26 – 2022-05-31 (×11): 15 ug via RESPIRATORY_TRACT
  Filled 2022-05-26 (×11): qty 2

## 2022-05-26 MED ORDER — IPRATROPIUM-ALBUTEROL 0.5-2.5 (3) MG/3ML IN SOLN: 3.0000 mL | RESPIRATORY_TRACT | Status: DC | PRN

## 2022-05-26 MED ORDER — PROSOURCE TF PO LIQD
90.0000 mL | Freq: Four times a day (QID) | ORAL | Status: DC
  Administered 2022-05-26 – 2022-05-27 (×5): 90 mL
  Filled 2022-05-26 (×5): qty 90

## 2022-05-26 MED ORDER — VITAL HIGH PROTEIN PO LIQD
1000.0000 mL | ORAL | Status: DC
Start: 2022-05-26 — End: 2022-05-28
  Administered 2022-05-26 – 2022-05-27 (×2): 1000 mL

## 2022-05-26 MED ORDER — FUROSEMIDE 10 MG/ML IJ SOLN
40.0000 mg | Freq: Once | INTRAMUSCULAR | Status: AC
  Administered 2022-05-26: 40 mg via INTRAVENOUS
  Filled 2022-05-26: qty 4

## 2022-05-26 MED ORDER — PANTOPRAZOLE SODIUM 40 MG IV SOLR
40.0000 mg | Freq: Two times a day (BID) | INTRAVENOUS | Status: DC
  Administered 2022-05-26 – 2022-05-27 (×5): 40 mg via INTRAVENOUS
  Filled 2022-05-26 (×5): qty 10

## 2022-05-26 MED ORDER — BUDESONIDE 0.5 MG/2ML IN SUSP
0.5000 mg | Freq: Two times a day (BID) | RESPIRATORY_TRACT | Status: DC
Start: 2022-05-26 — End: 2022-05-31
  Administered 2022-05-26 – 2022-05-31 (×11): 0.5 mg via RESPIRATORY_TRACT
  Filled 2022-05-26 (×11): qty 2

## 2022-05-26 NOTE — Progress Notes (Signed)
eLink Physician-Brief Progress Note Patient Name: Joseph Morales DOB: 1971/07/16 MRN: [id number]   Date of Service  05/26/2022  HPI/Events of Note  Agitation - Nursing request to renew restraint orders.   eICU Interventions  Will renew bilateral soft wrist restraint orders X 11 hours.      Intervention Category Major Interventions: Delirium, psychosis, severe agitation - evaluation and management  Dominica Kent Eugene 05/26/2022, 11:18 PM

## 2022-05-26 NOTE — Progress Notes (Signed)
Initial Nutrition Assessment  DOCUMENTATION CODES:   Morbid obesity  INTERVENTION:  - will order tube feeding: Vital High Protein @ 65 ml/hr with 90 ml Prosource TF QID.  - this regimen will provide 1880 kcal (2807 kcal with current propofol rate), 224 grams protein, and 1304 ml free water.   NUTRITION DIAGNOSIS:   Inadequate oral intake related to inability to eat as evidenced by NPO status.  GOAL:   Provide needs based on ASPEN/SCCM guidelines  MONITOR:   Vent status, TF tolerance, Labs, Weight trends  REASON FOR ASSESSMENT:   Ventilator, Consult Enteral/tube feeding initiation and management  ASSESSMENT:   51 year old male with medical history of asthma, diastolic heart failure, obesity, hypoventilation syndrome, COPD, and HTN. He presented to the ED due to AMS and acute hypoxic and hypercarbic respiratory failure. He was admitted for acute respiratory failure. He failed trial of BiPAP and in the ED and was subsequently intubated before being transferred to the ICU.  Patient remains intubated. NGT in L nare (side port below diaphragm per abdominal x-ray on 5/28).  Patient's mom and RN conversing at bedside. RN aware of plan for TF initiation.  Weight today is 390 lb which is up 11 lb from weight of 379 lb on 02/06/22; no weight recordings between these two dates.   Per notes: - suspected CAP, asthma exacerbation - AKI--improving   Patient is currently intubated on ventilator support MV: 13.1 L/min Temp (24hrs), Avg:99.8 F (37.7 C), Min:98.8 F (37.1 C), Max:100.8 F (38.2 C) Propofol: 35.1 ml/hr (927 kcal/24 hrs) BP: 152/92 and MAP: 109   Labs reviewed; BUN: 23 mg/dl. Medications reviewed; sliding scale novolog, 80 mg solu-medrol BID, 40 mg IV protonix BID. Drip; propofol @ 35 mcg/kg/min. IVF; NS @ 125 ml/hr.     NUTRITION - FOCUSED PHYSICAL EXAM:  Flowsheet Row Most Recent Value  Orbital Region Unable to assess  [ETT holder]  Upper Arm Region No  depletion  Thoracic and Lumbar Region Unable to assess  Buccal Region Unable to assess  [ETT holder]  Temple Region No depletion  Clavicle Bone Region No depletion  Clavicle and Acromion Bone Region No depletion  Scapular Bone Region No depletion  Dorsal Hand No depletion  Patellar Region No depletion  Anterior Thigh Region No depletion  Posterior Calf Region No depletion  Edema (RD Assessment) Mild  [BLE]  Hair Reviewed  Eyes Unable to assess  Mouth Unable to assess  Skin Reviewed  Nails Reviewed       Diet Order:   Diet Order             Diet NPO time specified  Diet effective now                   EDUCATION NEEDS:   No education needs have been identified at this time  Skin:  Skin Assessment: Reviewed RN Assessment  Last BM:  PTA/unknown  Height:   Ht Readings from Last 1 Encounters:  05/25/22 6\' 3"  (1.905 m)    Weight:   Wt Readings from Last 1 Encounters:  05/26/22 (!) 176.9 kg     BMI:  Body mass index is 48.75 kg/m.  Estimated Nutritional Needs:  Kcal:  05/28/22 kcal Protein:  >/= 223 grams Fluid:  >/= 2.5 L/day     [id number], MS, RD, LDN Registered Dietitian II Inpatient Clinical Nutrition RD pager # and on-call/weekend pager # available in [facility]

## 2022-05-26 NOTE — Progress Notes (Addendum)
NAME:  Joseph Morales, MRN:  [id number], DOB:  09/21/1971, LOS: 1 ADMISSION DATE:  05/25/2022, CONSULTATION DATE:  05/25/22 REFERRING MD:  Theodoro Grist MD, CHIEF COMPLAINT:   Hypercarbic respiratory failure  History of Present Illness:   51 year old with history of asthma, diastolic heart failure, obesity hypoventilation syndrome admitted with altered mental status, acute hypoxic and hypercarbic respiratory failure.  Preceding symptoms are unclear as family is not present and mother who lives with the patient is at a funeral.  He was brought to the ED by his cousin.  Tried on BiPAP in the ED but failed and was intubated.  PCCM consulted for admission.  Pertinent  Medical History    has a past medical history of Asthma, CHF (congestive heart failure) (HCC), Chronic diastolic heart failure (HCC), COPD (chronic obstructive pulmonary disease) (HCC), Hypertension, Hypoventilation associated with obesity syndrome (HCC), and Obesity.   Significant Hospital Events: Including procedures, antibiotic start and stop dates in addition to other pertinent events   5/28 Admit. Intubated in ED  Interim History / Subjective:   Remains on the ventilator  Objective   Blood pressure (!) 148/75, pulse (!) 101, temperature 99.7 F (37.6 C), resp. rate (!) 0, height 6\' 3"  (1.905 m), weight (!) 176.9 kg, SpO2 95 %.    Vent Mode: PRVC FiO2 (%):  [50 %-70 %] 50 % Set Rate:  [22 bmp] 22 bmp Vt Set:  [600 mL] 600 mL PEEP:  [5 cmH20] 5 cmH20 Plateau Pressure:  [21 cmH20-30 cmH20] 23 cmH20   Intake/Output Summary (Last 24 hours) at 05/26/2022 0846 Last data filed at 05/26/2022 05/28/2022 Gross per 24 hour  Intake 5165.56 ml  Output 1475 ml  Net 3690.56 ml   Filed Weights   05/25/22 1253 05/26/22 0409  Weight: (!) 167.8 kg (!) 176.9 kg    Examination: Gen:      No acute distress, obese HEENT:  EOMI, sclera anicteric Neck:     No masses; no thyromegaly, ET tube Lungs:   Bilateral expiratory wheeze CV:          Regular rate and rhythm; no murmurs Abd:      + bowel sounds; soft, non-tender; no palpable masses, no distension Ext:    No edema; adequate peripheral perfusion Skin:      Warm and dry; no rash Neuro: Sedated  Labs/imaging reviewed Glucose 212, creatinine 1.07 WBC 24.8 No new imaging  Resolved Hospital Problem list     Assessment & Plan:  Acute respiratory failure with hypoxia and hypercarbia Suspected community-acquired pneumonia, asthma exacerbation Negative for COVID and flu on admission Ceftriaxone, azithromycin IV Solu-Medrol and nebulizers Follow chest x-ray and ABG  Acute kidney injury, hyperkalemia > improved Discontinue IV fluid. Lasix 40 mg x 1 as he looks fluid overloaded Monitor urine output and creatinine Start tube feeds  Diastolic heart failure Telemetry monitoring.  Best Practice (right click and "Reselect all SmartList Selections" daily)   Diet/type: tubefeeds DVT prophylaxis: prophylactic heparin  GI prophylaxis: PPI Lines: N/A Foley:  N/A Code Status:  full code Last date of multidisciplinary goals of care discussion [] .  Mom updated 5/29  Critical care time:    The patient is critically ill with multiple organ system failure and requires high complexity decision making for assessment and support, frequent evaluation and titration of therapies, advanced monitoring, review of radiographic studies and interpretation of complex data.   Critical Care Time devoted to patient care services, exclusive of separately billable procedures, described in this note is  35 minutes.   Chilton Greathouse MD [facility] See Amion for pager  If no response to pager , please call [phone] until 7pm After 7:00 pm call Elink  [phone] 05/26/2022, 8:46 AM

## 2022-05-27 ENCOUNTER — Inpatient Hospital Stay (HOSPITAL_COMMUNITY): Payer: Medicaid Other

## 2022-05-27 DIAGNOSIS — J9602 Acute respiratory failure with hypercapnia: Secondary | ICD-10-CM | POA: Diagnosis not present

## 2022-05-27 DIAGNOSIS — J9601 Acute respiratory failure with hypoxia: Secondary | ICD-10-CM | POA: Diagnosis not present

## 2022-05-27 LAB — GLUCOSE, CAPILLARY
Glucose-Capillary: 266 mg/dL — ABNORMAL HIGH (ref 70–99)
Glucose-Capillary: 283 mg/dL — ABNORMAL HIGH (ref 70–99)
Glucose-Capillary: 234 mg/dL — ABNORMAL HIGH (ref 70–99)
Glucose-Capillary: 215 mg/dL — ABNORMAL HIGH (ref 70–99)
Glucose-Capillary: 165 mg/dL — ABNORMAL HIGH (ref 70–99)
Glucose-Capillary: 142 mg/dL — ABNORMAL HIGH (ref 70–99)

## 2022-05-27 LAB — BASIC METABOLIC PANEL
Anion gap: 8 (ref 5–15)
BUN: 31 mg/dL — ABNORMAL HIGH (ref 6–20)
CO2: 31 mmol/L (ref 22–32)
Calcium: 8.6 mg/dL — ABNORMAL LOW (ref 8.9–10.3)
Chloride: 97 mmol/L — ABNORMAL LOW (ref 98–111)
Creatinine, Ser: 0.92 mg/dL (ref 0.61–1.24)
GFR, Estimated: >60 mL/min (ref >60)
Glucose, Bld: 291 mg/dL — ABNORMAL HIGH (ref 70–99)
Potassium: 4.1 mmol/L (ref 3.5–5.1)
Sodium: 136 mmol/L (ref 135–145)

## 2022-05-27 LAB — CBC
HCT: 44.2 % (ref 39.0–52.0)
Hemoglobin: 13.6 g/dL (ref 13.0–17.0)
MCH: 29.4 pg (ref 26.0–34.0)
MCHC: 30.8 g/dL (ref 30.0–36.0)
MCV: 95.5 fL (ref 80.0–100.0)
Platelets: 301 10*3/uL (ref 150–400)
RBC: 4.63 MIL/uL (ref 4.22–5.81)
RDW: 14.4 % (ref 11.5–15.5)
WBC: 31.8 10*3/uL — ABNORMAL HIGH (ref 4.0–10.5)
nRBC: 0.1 % (ref 0.0–0.2)

## 2022-05-27 LAB — PHOSPHORUS
Phosphorus: 3.7 mg/dL (ref 2.5–4.6)
Phosphorus: 5.2 mg/dL — ABNORMAL HIGH (ref 2.5–4.6)

## 2022-05-27 LAB — MAGNESIUM
Magnesium: 2.2 mg/dL (ref 1.7–2.4)
Magnesium: 2.5 mg/dL — ABNORMAL HIGH (ref 1.7–2.4)

## 2022-05-27 MED ORDER — FENTANYL CITRATE (PF) 100 MCG/2ML IJ SOLN
INTRAMUSCULAR | Status: AC
  Filled 2022-05-27: qty 2

## 2022-05-27 MED ORDER — CHLORHEXIDINE GLUCONATE CLOTH 2 % EX PADS
6.0000 | MEDICATED_PAD | Freq: Every day | CUTANEOUS | Status: DC
Start: 2022-05-27 — End: 2022-05-30
  Administered 2022-05-27: 6 via TOPICAL

## 2022-05-27 MED ORDER — ORAL CARE MOUTH RINSE
15.0000 mL | Freq: Two times a day (BID) | OROMUCOSAL | Status: DC
  Administered 2022-05-28 – 2022-05-30 (×4): 15 mL via OROMUCOSAL

## 2022-05-27 MED ORDER — ETOMIDATE 2 MG/ML IV SOLN
INTRAVENOUS | Status: AC
  Filled 2022-05-27: qty 20

## 2022-05-27 MED ORDER — METHYLPREDNISOLONE SODIUM SUCC 125 MG IJ SOLR
80.0000 mg | Freq: Every day | INTRAMUSCULAR | Status: DC
  Administered 2022-05-27: 80 mg via INTRAVENOUS
  Filled 2022-05-27: qty 2

## 2022-05-27 MED ORDER — PROPOFOL 1000 MG/100ML IV EMUL
INTRAVENOUS | Status: AC
  Filled 2022-05-27: qty 100

## 2022-05-27 MED ORDER — ALBUTEROL SULFATE (2.5 MG/3ML) 0.083% IN NEBU: 2.5000 mg | INHALATION_SOLUTION | RESPIRATORY_TRACT | Status: DC | PRN

## 2022-05-27 MED ORDER — ROCURONIUM BROMIDE 10 MG/ML (PF) SYRINGE
PREFILLED_SYRINGE | INTRAVENOUS | Status: AC
  Filled 2022-05-27: qty 10

## 2022-05-27 MED ORDER — MIDAZOLAM HCL 2 MG/2ML IJ SOLN
INTRAMUSCULAR | Status: AC
  Filled 2022-05-27: qty 2

## 2022-05-27 MED ORDER — REVEFENACIN 175 MCG/3ML IN SOLN
175.0000 ug | Freq: Every day | RESPIRATORY_TRACT | Status: DC
  Administered 2022-05-27 – 2022-05-31 (×5): 175 ug via RESPIRATORY_TRACT
  Filled 2022-05-27 (×5): qty 3

## 2022-05-27 MED ORDER — FUROSEMIDE 10 MG/ML IJ SOLN
60.0000 mg | Freq: Once | INTRAMUSCULAR | Status: AC
Start: 2022-05-27 — End: 2022-05-27
  Administered 2022-05-27: 60 mg via INTRAVENOUS
  Filled 2022-05-27: qty 6

## 2022-05-27 MED ORDER — CHLORHEXIDINE GLUCONATE 0.12 % MT SOLN
15.0000 mL | Freq: Two times a day (BID) | OROMUCOSAL | Status: DC
  Administered 2022-05-27 – 2022-05-31 (×7): 15 mL via OROMUCOSAL
  Filled 2022-05-27 (×5): qty 15

## 2022-05-27 NOTE — Progress Notes (Addendum)
Vomited due to gagging with ETT, decompressed for an hr with NG to LIWS, no longer feels nauseated. His intubation in ED was reportedly challenging (hard to achieve adequate sedation, vocal cords seen but needed cricoid pressure). He is on BiPAP 25/21 at home for his OSA. We talked about trial of extubation to NIV with high risk of reintubation which could be especially life-threatening for him vs trach. He is aware of risk and prefers trial of extubation. Extubated to 20/12 PC/NIV, 40% FiO2 on vent with good tidal volumes - 500cc+.  Laroy Apple Pulmonary/Critical Care

## 2022-05-27 NOTE — Procedures (Signed)
Extubation Procedure Note  Patient Details:   Name: Joseph Morales DOB: 05-01-1971 MRN: [id number]   Airway Documentation:  Airway 7.5 mm (Active)  Secured at (cm) 25 cm 05/27/22 1255  Measured From Lips 05/27/22 [place] 05/27/22 1255  Secured By [employer] 05/27/22 1255  Tube Holder Repositioned Yes 05/27/22 1255  Prone position No 05/27/22 1255  Head position Right 05/27/22 0400  Cuff Pressure (cm H2O) Clear OR 27-39 CmH2O 05/26/22 2012  Site Condition Dry 05/27/22 0400   Vent end date: 05/27/22 Vent end time: 1650   Evaluation  O2 sats: stable throughout Complications: No apparent complications Patient did tolerate procedure well. Bilateral Breath Sounds: Diminished, Rhonchi   Yes  Per CCM, Pt extubated and placed on BIPAP  Karl Knarr S 05/27/2022, 5:00 PM

## 2022-05-27 NOTE — Progress Notes (Signed)
NAME:  Joseph Morales, MRN:  [id number], DOB:  [birth year], LOS: 2 ADMISSION DATE:  05/25/2022, CONSULTATION DATE:  05/25/22 REFERRING MD:  Theodoro Grist MD, CHIEF COMPLAINT:   Hypercarbic respiratory failure  History of Present Illness:   51 year old with history of asthma, diastolic heart failure, obesity hypoventilation syndrome admitted with altered mental status, acute hypoxic and hypercarbic respiratory failure.  Preceding symptoms are unclear as family is not present and mother who lives with the patient is at a funeral.  He was brought to the ED by his cousin.  Tried on BiPAP in the ED but failed and was intubated.  PCCM consulted for admission.  Pertinent  Medical History    has a past medical history of Asthma, CHF (congestive heart failure) (HCC), Chronic diastolic heart failure (HCC), COPD (chronic obstructive pulmonary disease) (HCC), Hypertension, Hypoventilation associated with obesity syndrome (HCC), and Obesity.   Significant Hospital Events: Including procedures, antibiotic start and stop dates in addition to other pertinent events   5/28 Admit. Intubated in ED  Interim History / Subjective:   Agitated overnight, deeply sedated this morning. Little in way of ETT secretions.  Objective   Blood pressure 139/75, pulse 74, temperature (!) 97.5 F (36.4 C), temperature source Bladder, resp. rate 20, height 6\' 3"  (1.905 m), weight (!) 177.2 kg, SpO2 93 %.    Vent Mode: PRVC FiO2 (%):  [40 %-50 %] 40 % Set Rate:  [18 bmp-22 bmp] 18 bmp Vt Set:  [600 mL] 600 mL PEEP:  [5 cmH20] 5 cmH20 Plateau Pressure:  [20 cmH20-24 cmH20] 23 cmH20   Intake/Output Summary (Last 24 hours) at 05/27/2022 05/29/2022 Last data filed at 05/27/2022 0800 Gross per 24 hour  Intake 3344.08 ml  Output 3515 ml  Net -170.92 ml   Filed Weights   05/25/22 1253 05/26/22 0409 05/27/22 0500  Weight: (!) 167.8 kg (!) 176.9 kg (!) 177.2 kg    Examination: Gen:      No acute distress, obese HEENT:  PERRL,  sclera anicteric Lungs:   Distant, equal chest rise CV:         Regular rate and rhythm; no murmurs Abd:      + bowel sounds; soft, non-tender; no palpable masses, no distension Ext:    No edema; adequate peripheral perfusion Skin:      Warm and dry; no rash Neuro: Sedated  Labs/imaging reviewed S Cr trending down WBC 24.8 -> 31.8   CXR with worse aeration bl  Resolved Hospital Problem list     Assessment & Plan:  Acute respiratory failure with hypoxia and hypercarbia Suspected community-acquired pneumonia, asthma exacerbation - continue CAP coverage - continue brovana, start yupelri, continue prn bronchodilators  - decrease to IV methylprednisolone 80 mg daily - diurese for net negative fluid balance - SAT/SBT - did have high peak pressures still ~12 cmH20, may need a little more time before extubation treating his asthma - check tracheal aspirate - LTVV, pad protocol RASS -1  Acute kidney injury, hyperkalemia > improved - diuretic challenge as above - avoid nephrotoxins  Diastolic heart failure - Diuresis as above  Transaminitis - repeat LFTs in AM  Best Practice (right click and "Reselect all SmartList Selections" daily)   Diet/type: tubefeeds DVT prophylaxis: prophylactic heparin  GI prophylaxis: PPI Lines: N/A Foley:  N/A Code Status:  full code Last date of multidisciplinary goals of care discussion [] .  Will call the patient's mother later today to update.  Critical care time: 38 minutes   The  patient is critically ill with multiple organ system failure and requires high complexity decision making for assessment and support, frequent evaluation and titration of therapies, advanced monitoring, review of radiographic studies and interpretation of complex data.   Critical Care Time devoted to patient care services, exclusive of separately billable procedures, described in this note is 38 minutes.   Laroy Apple Pulmonary/Critical Care  After 7:00  pm call Elink  [phone] 05/27/2022, 8:52 AM

## 2022-05-27 NOTE — Progress Notes (Signed)
Patient alarming high pressure alarms on ventilator. On assessment patient vomiting via mouth. Patient ETT suctioned with thick mucus. Tube feeds stopped. NGT hooked to suction with over 700 mls evacuated. Patient reporting fullness and nausea via communication board. MD and NP messaged to notify.

## 2022-05-27 NOTE — Progress Notes (Signed)
Inpatient Diabetes Program Recommendations  AACE/ADA: New Consensus Statement on Inpatient Glycemic Control (2015)  Target Ranges:  Prepandial:   less than 140 mg/dL      Peak postprandial:   less than 180 mg/dL (1-2 hours)      Critically ill patients:  140 - 180 mg/dL   Lab Results  Component Value Date   GLUCAP 283 (H) 05/27/2022   HGBA1C 6.4 (H) 05/25/2022    Review of Glycemic Control  Latest Reference Range & Units 05/26/22 19:48 05/26/22 23:47 05/27/22 03:48 05/27/22 08:01  Glucose-Capillary 70 - 99 mg/dL 228 (H) 195 (H) 266 (H) 283 (H)  (H): Data is abnormally high  Diabetes history: DM2 Outpatient Diabetes medications: Victoza Current orders for Inpatient glycemic control: Novolog 0-15 units Q4H, Solu-medrol 80 mg QD  Inpatient Diabetes Program Recommendations:    Please consider Novolog 3 units TID tube feed coverage; stop if feeds are held or discontinued.  Will continue to follow while inpatient.  Thank you, Reche Dixon, MSN, [place] Diabetes Coordinator Inpatient Diabetes Program [phone] (team pager from 8a-5p)

## 2022-05-27 NOTE — Plan of Care (Signed)

## 2022-05-27 NOTE — Progress Notes (Signed)
Transition of Care [facility]) Screening Note  Patient Details  Name: Usher Hedberg Date of Birth: Nov 15, 1971  Transition of Care [facility]) CM/SW Contact:    Ewing Schlein, LCSW Phone Number: 05/27/2022, 11:32 AM  Transition of Care Department [facility]) has reviewed patient and no TOC needs have been identified at this time. We will continue to monitor patient advancement through interdisciplinary progression rounds. If new patient transition needs arise, please place a TOC consult.

## 2022-05-28 ENCOUNTER — Encounter (HOSPITAL_COMMUNITY): Payer: Self-pay | Admitting: Pulmonary Disease

## 2022-05-28 ENCOUNTER — Other Ambulatory Visit: Payer: Self-pay

## 2022-05-28 DIAGNOSIS — J9601 Acute respiratory failure with hypoxia: Secondary | ICD-10-CM | POA: Diagnosis not present

## 2022-05-28 DIAGNOSIS — J9602 Acute respiratory failure with hypercapnia: Secondary | ICD-10-CM | POA: Diagnosis not present

## 2022-05-28 LAB — HEPATIC FUNCTION PANEL
ALT: 36 U/L (ref 0–44)
AST: 31 U/L (ref 15–41)
Albumin: 3.2 g/dL — ABNORMAL LOW (ref 3.5–5.0)
Alkaline Phosphatase: 55 U/L (ref 38–126)
Bilirubin, Direct: 0.3 mg/dL — ABNORMAL HIGH (ref 0.0–0.2)
Indirect Bilirubin: 1.2 mg/dL — ABNORMAL HIGH (ref 0.3–0.9)
Total Bilirubin: 1.5 mg/dL — ABNORMAL HIGH (ref 0.3–1.2)
Total Protein: 6.8 g/dL (ref 6.5–8.1)

## 2022-05-28 LAB — BASIC METABOLIC PANEL
Anion gap: 8 (ref 5–15)
BUN: 33 mg/dL — ABNORMAL HIGH (ref 6–20)
CO2: 33 mmol/L — ABNORMAL HIGH (ref 22–32)
Calcium: 8.6 mg/dL — ABNORMAL LOW (ref 8.9–10.3)
Chloride: 99 mmol/L (ref 98–111)
Creatinine, Ser: 1.07 mg/dL (ref 0.61–1.24)
GFR, Estimated: >60 mL/min (ref >60)
Glucose, Bld: 182 mg/dL — ABNORMAL HIGH (ref 70–99)
Potassium: 3.8 mmol/L (ref 3.5–5.1)
Sodium: 140 mmol/L (ref 135–145)

## 2022-05-28 LAB — GLUCOSE, CAPILLARY
Glucose-Capillary: 131 mg/dL — ABNORMAL HIGH (ref 70–99)
Glucose-Capillary: 98 mg/dL (ref 70–99)
Glucose-Capillary: 214 mg/dL — ABNORMAL HIGH (ref 70–99)
Glucose-Capillary: 252 mg/dL — ABNORMAL HIGH (ref 70–99)
Glucose-Capillary: 247 mg/dL — ABNORMAL HIGH (ref 70–99)

## 2022-05-28 LAB — POTASSIUM
Potassium: 3.9 mmol/L (ref 3.5–5.1)
Potassium: 4.2 mmol/L (ref 3.5–5.1)

## 2022-05-28 MED ORDER — LIP MEDEX EX OINT
TOPICAL_OINTMENT | CUTANEOUS | Status: DC | PRN
  Filled 2022-05-28: qty 7

## 2022-05-28 MED ORDER — CARVEDILOL 6.25 MG PO TABS
6.2500 mg | ORAL_TABLET | Freq: Two times a day (BID) | ORAL | Status: DC
  Administered 2022-05-28 – 2022-05-31 (×7): 6.25 mg via ORAL
  Filled 2022-05-28 (×7): qty 1

## 2022-05-28 MED ORDER — PANTOPRAZOLE SODIUM 40 MG PO TBEC
40.0000 mg | DELAYED_RELEASE_TABLET | Freq: Every day | ORAL | Status: DC
  Administered 2022-05-28 – 2022-05-31 (×4): 40 mg via ORAL
  Filled 2022-05-28 (×4): qty 1

## 2022-05-28 MED ORDER — BUPROPION HCL ER (XL) 300 MG PO TB24
300.0000 mg | ORAL_TABLET | Freq: Every day | ORAL | Status: DC
Start: 2022-05-28 — End: 2022-05-31
  Administered 2022-05-28 – 2022-05-31 (×4): 300 mg via ORAL
  Filled 2022-05-28 (×4): qty 1

## 2022-05-28 MED ORDER — DOCUSATE SODIUM 50 MG/5ML PO LIQD: 100.0000 mg | Freq: Two times a day (BID) | ORAL | Status: DC | PRN

## 2022-05-28 MED ORDER — PREDNISONE 20 MG PO TABS
60.0000 mg | ORAL_TABLET | Freq: Every day | ORAL | Status: DC
  Administered 2022-05-28 – 2022-05-30 (×3): 60 mg via ORAL
  Filled 2022-05-28 (×3): qty 3

## 2022-05-28 NOTE — Progress Notes (Signed)
Patient removed from bipap and placed on 7L HFNC.

## 2022-05-28 NOTE — TOC Initial Note (Signed)
Transition of Care [facility]) - Initial/Assessment Note    Patient Details  Name: Joseph Morales MRN: [id number] Date of Birth: 10/15/1971  Transition of Care [facility]) CM/SW Contact:    Lanier Clam, RN Phone Number: 05/28/2022, 2:55 PM  Clinical Narrative: [employer] to f/u on BIPAP needs.                  Expected Discharge Plan: Home/Self Care Barriers to Discharge: Continued Medical Work up   Patient Goals and CMS Choice Patient states their goals for this hospitalization and ongoing recovery are:: Home CMS Medicare.gov Compare Post Acute Care list provided to:: Patient Choice offered to / list presented to : Patient  Expected Discharge Plan and Services Expected Discharge Plan: Home/Self Care   Discharge Planning Services: CM Consult   Living arrangements for the past 2 months: Single Family Home                                      Prior Living Arrangements/Services Living arrangements for the past 2 months: Single Family Home Lives with:: Self Patient language and need for interpreter reviewed:: Yes Do you feel safe going back to the place where you live?: Yes      Need for Family Participation in Patient Care: Yes (Comment) Care giver support system in place?: Yes (comment) Current home services: DME (Home 02-[employer]) Criminal Activity/Legal Involvement Pertinent to Current Situation/Hospitalization: No - Comment as needed  Activities of Daily Living      Permission Sought/Granted Permission sought to share information with : Case Manager Permission granted to share information with : Yes, Verbal Permission Granted  Share Information with NAME: Case Manager           Emotional Assessment Appearance:: Appears stated age Attitude/Demeanor/Rapport: Gracious Affect (typically observed): Accepting Orientation: : Oriented to Self, Oriented to Situation Alcohol / Substance Use: Not Applicable Psych Involvement: No (comment)  Admission diagnosis:   Acute respiratory failure (HCC) [J96.00] COPD exacerbation (HCC) [J44.1] Acute respiratory failure with hypoxia (HCC) [J96.01] Severe sepsis (HCC) [A41.9, R65.20] Patient Active Problem List   Diagnosis Date Noted   Acute respiratory failure (HCC) 05/25/2022   Acute respiratory failure with hypoxia and hypercarbia (HCC) 12/24/2021   Influenza A 12/24/2021   Acute respiratory failure with hypoxia and hypercapnia (HCC) 12/24/2021   Hypercapnic respiratory failure (HCC) 05/18/2021   Erectile dysfunction 02/22/2021   Tobacco abuse 10/24/2020   Chronic respiratory failure with hypoxia and hypercapnia (HCC) 10/16/2020   Essential hypertension 10/16/2020   Hyperlipidemia 10/16/2020   Asthma 06/21/2020   Chronic diastolic heart failure (HCC) 06/21/2020   Orthopnea 06/21/2020   Chronic venous insufficiency 06/21/2020   Cellulitis 06/21/2020   Obesity, Class III, BMI 40-49.9 (morbid obesity) (HCC) 06/21/2020   Obesity hypoventilation syndrome (HCC) 06/21/2020   Leucocytosis 06/21/2020   Chronic respiratory alkalosis 06/21/2020   Hypertensive urgency 06/21/2020   Calf tenderness 06/21/2020   PCP:  Hollowell, Sidonie Dickens, MD Pharmacy:   [id number] - [address] [place] - [address] AT [facility] [address] [place] [address] Phone: [phone] Fax: [phone]     Social Determinants of Health (SDOH) Interventions    Readmission Risk Interventions     View : No data to display.

## 2022-05-28 NOTE — Progress Notes (Signed)
The patient ambulated to the restroom. He sat along side the bed for 30 minutes. Activity tolerated well. Plan to progress activity level to ambulation in am. Will continue to monitor and encourage.

## 2022-05-28 NOTE — Progress Notes (Signed)
Mr. Serena presents with Acute on chronic respiratory failure with hypoxia and hypercarbia due to obesity hypoventilation syndrome.  The use of the NIV will treat patient's high PC02 levels (63 on  05/25/22 with elevated bicarb of 42.8 while using the ventillator) and ventilatory defects and can reduce risk of exacerbations and future hospitalizations when used at night and during the day.  All alternate devices [phone] and [id number]) have been proven ineffective to provide essential volume control necessary to maintain acceptable CO2 levels along with maintaining an appropriate volume for the patient's lungs.  An NIV with volume-targeted pressure support is necessary to prevent patient from life-threatening harm.  Interruption or failure to provide NIV would quickly lead to exacerbation of the patient's condition, hospital re-admission, and likely harm to the patient. Continued use is preferred.  Patient is able to protect their airways and clear secretions on their own.

## 2022-05-28 NOTE — Progress Notes (Addendum)
NAME:  Joseph Morales, MRN:  [id number], DOB:  [birth year], LOS: 3 ADMISSION DATE:  05/25/2022, CONSULTATION DATE:  05/25/22 REFERRING MD:  Theodoro Grist MD, CHIEF COMPLAINT:   Hypercarbic respiratory failure  History of Present Illness:   51 year old with history of asthma, diastolic heart failure, obesity hypoventilation syndrome admitted with altered mental status, acute hypoxic and hypercarbic respiratory failure.  Preceding symptoms are unclear as family is not present and mother who lives with the patient is at a funeral.  He was brought to the ED by his cousin.  Tried on BiPAP in the ED but failed and was intubated.  PCCM consulted for admission.  Pertinent  Medical History    has a past medical history of Asthma, CHF (congestive heart failure) (HCC), Chronic diastolic heart failure (HCC), COPD (chronic obstructive pulmonary disease) (HCC), Hypertension, Hypoventilation associated with obesity syndrome (HCC), and Obesity.   Significant Hospital Events: Including procedures, antibiotic start and stop dates in addition to other pertinent events   5/28 Admit. Intubated in ED  Interim History / Subjective:   Extubated to NIV. Doing well this morning.   Objective   Blood pressure (!) 154/103, pulse 67, temperature 98.2 F (36.8 C), resp. rate (!) 23, height 6\' 3"  (1.905 m), weight (!) 175 kg, SpO2 97 %.    Vent Mode: BIPAP;PCV FiO2 (%):  [40 %-50 %] 40 % Set Rate:  [14 bmp-22 bmp] 14 bmp Vt Set:  [600 mL] 600 mL PEEP:  [5 cmH20-12 cmH20] 12 cmH20 Plateau Pressure:  [23 cmH20-25 cmH20] 25 cmH20   Intake/Output Summary (Last 24 hours) at 05/28/2022 0730 Last data filed at 05/28/2022 05/30/2022 Gross per 24 hour  Intake 1146.49 ml  Output 4425 ml  Net -3278.51 ml   Filed Weights   05/26/22 0409 05/27/22 0500 05/28/22 0355  Weight: (!) 176.9 kg (!) 177.2 kg (!) 175 kg    Examination: Gen:      No acute distress HEENT:  PERRL, sclera anicteric Lungs:   Distant, equal chest rise CV:          Regular rate and rhythm; no murmurs Abd:      + bowel sounds; soft, non-tender; no palpable masses, no distension Ext:    trace edema; adequate peripheral perfusion Skin:      Warm and dry; no rash Neuro: grossly nonfocal  Labs/imaging reviewed Labs pending   No new imaging  Resolved Hospital Problem list     Assessment & Plan:  Acute on chronic respiratory failure with hypoxia and hypercarbia Suspected community-acquired pneumonia, asthma exacerbation, volume overload superimposed on OHS/OSA. Sounds like he uses 2L O2 at rest and 4L O2 with exertion at baseline recently.  - continue ceftriaxone for 5 day course - continue brovana, yupelri while here. Would resume symbicort 160 2 puffs twice daily with spacer, spiriva respimat 2 puffs daily at discharge.  - decrease to IV methylprednisolone 80 mg daily, taper off over the next 10 days - hold off on diuresis till we have labs from this morning - TOC consult placed to ensure he has equipment for his CPAP, nasal cannulas for his concentrator, DME supplier is [employer] - Will request pulmonary/sleep clinic follow up with Dr. 05/30/22  Acute kidney injury, hyperkalemia > improved - CTM - avoid nephrotoxins  Diastolic heart failure - start home coreg 6.25 BID - await labs before restarting losartan  Transaminitis - await LFTs this AM  Best Practice (right click and "Reselect all SmartList Selections" daily)   Diet/type: Regular consistency (  see orders) DVT prophylaxis: prophylactic heparin  GI prophylaxis: PPI Lines: N/A Foley:  N/A Code Status:  full code Last date of multidisciplinary goals of care discussion [] . Updated pt at bedside today.  Critical care time: n/a    Pulmonary/Critical Care  After 7:00 pm call Elink  [phone] 05/28/2022, 7:30 AM

## 2022-05-29 DIAGNOSIS — J189 Pneumonia, unspecified organism: Secondary | ICD-10-CM

## 2022-05-29 DIAGNOSIS — J441 Chronic obstructive pulmonary disease with (acute) exacerbation: Secondary | ICD-10-CM

## 2022-05-29 DIAGNOSIS — J9621 Acute and chronic respiratory failure with hypoxia: Secondary | ICD-10-CM

## 2022-05-29 DIAGNOSIS — D72829 Elevated white blood cell count, unspecified: Secondary | ICD-10-CM

## 2022-05-29 DIAGNOSIS — J9622 Acute and chronic respiratory failure with hypercapnia: Secondary | ICD-10-CM

## 2022-05-29 LAB — BASIC METABOLIC PANEL
Anion gap: 5 (ref 5–15)
BUN: 23 mg/dL — ABNORMAL HIGH (ref 6–20)
CO2: 35 mmol/L — ABNORMAL HIGH (ref 22–32)
Calcium: 8.2 mg/dL — ABNORMAL LOW (ref 8.9–10.3)
Chloride: 98 mmol/L (ref 98–111)
Creatinine, Ser: 0.89 mg/dL (ref 0.61–1.24)
GFR, Estimated: >60 mL/min (ref >60)
Glucose, Bld: 183 mg/dL — ABNORMAL HIGH (ref 70–99)
Potassium: 4.1 mmol/L (ref 3.5–5.1)
Sodium: 138 mmol/L (ref 135–145)

## 2022-05-29 LAB — GLUCOSE, CAPILLARY
Glucose-Capillary: 150 mg/dL — ABNORMAL HIGH (ref 70–99)
Glucose-Capillary: 172 mg/dL — ABNORMAL HIGH (ref 70–99)
Glucose-Capillary: 196 mg/dL — ABNORMAL HIGH (ref 70–99)
Glucose-Capillary: 197 mg/dL — ABNORMAL HIGH (ref 70–99)
Glucose-Capillary: 267 mg/dL — ABNORMAL HIGH (ref 70–99)
Glucose-Capillary: 301 mg/dL — ABNORMAL HIGH (ref 70–99)

## 2022-05-29 LAB — CULTURE, RESPIRATORY W GRAM STAIN
Culture: NORMAL
Gram Stain: NONE SEEN

## 2022-05-29 LAB — POTASSIUM
Potassium: 4.1 mmol/L (ref 3.5–5.1)
Potassium: 3.8 mmol/L (ref 3.5–5.1)

## 2022-05-29 LAB — TRIGLYCERIDES: Triglycerides: 201 mg/dL — ABNORMAL HIGH (ref <150)

## 2022-05-29 MED ORDER — ENSURE ENLIVE PO LIQD: 237.0000 mL | Freq: Two times a day (BID) | ORAL | Status: DC

## 2022-05-29 MED ORDER — ENSURE ENLIVE PO LIQD
237.0000 mL | ORAL | Status: DC
  Administered 2022-05-29 – 2022-05-30 (×2): 237 mL via ORAL

## 2022-05-29 MED ORDER — FUROSEMIDE 10 MG/ML IJ SOLN
40.0000 mg | Freq: Two times a day (BID) | INTRAMUSCULAR | Status: DC
  Administered 2022-05-29 – 2022-05-31 (×5): 40 mg via INTRAVENOUS
  Filled 2022-05-29 (×5): qty 4

## 2022-05-29 NOTE — Progress Notes (Signed)
Inpatient Diabetes Program Recommendations  AACE/ADA: New Consensus Statement on Inpatient Glycemic Control (2015)  Target Ranges:  Prepandial:   less than 140 mg/dL      Peak postprandial:   less than 180 mg/dL (1-2 hours)      Critically ill patients:  140 - 180 mg/dL   Lab Results  Component Value Date   GLUCAP 150 (H) 05/29/2022   HGBA1C 6.4 (H) 05/25/2022    Review of Glycemic Control  Latest Reference Range & Units 05/28/22 07:54 05/28/22 11:27 05/28/22 16:07 05/28/22 19:48 05/29/22 00:18 05/29/22 04:30 05/29/22 07:13  Glucose-Capillary 70 - 99 mg/dL 98 214 (H) 252 (H) 247 (H) 196 (H) 197 (H) 150 (H)  (H): Data is abnormally high  Current orders for Inpatient glycemic control: Novolog 0-15 units Q4H, Prednisone 60 mg QD  Inpatient Diabetes Program Recommendations:    Tube feeds discontinued; regular diet started on 5/31.  Please consider:  Novolog 0-15 units TID and 0-5 units QHS Novolog 3 units TID with meals IF consumes at least 50%  Will continue to follow while inpatient.  Thank you, Reche Dixon, MSN, [place] Diabetes Coordinator Inpatient Diabetes Program [phone] (team pager from 8a-5p)  Carb modified diet

## 2022-05-29 NOTE — Progress Notes (Addendum)
PROGRESS NOTE    Joseph Morales  DVV:[id number] DOB: 1971-03-13 DOA: 05/25/2022 PCP: Gabriel Earing, MD   Brief Narrative:  51 year old male with history of asthma/COPD, chronic respiratory failure with hypoxia, chronic diastolic heart failure, obesity hypoventilation syndrome, hypertension presents with altered mental status, acute on chronic hypoxic and hypercarbic respiratory failure.  He was tried on BiPAP in the ED but failed and was intubated and was admitted to ICU under PCCM service.  COVID-19 and influenza testing was negative.  Chest x-ray showed bilateral patchy atelectasis or infiltrates.  He was started on Solu-Medrol, IV antibiotics and IV fluids.  Subsequently, IV fluids was discontinued and he received a dose of Lasix.  He was extubated on 05/27/2022.  He was transferred to [facility] service from 05/29/2022 onwards.  Assessment & Plan:   Acute on chronic respiratory failure with hypoxia and hypercarbia COPD/asthma exacerbation Possible community-acquired pneumonia Obesity hypoventilation syndrome -Normally wears 2 L oxygen at rest and 4 L oxygen with exertion.   -Continue BiPAP on presentation and required intubation.  Extubated on 05/27/2022.  Transferred to [facility] service from 05/29/2022 onwards. -Currently on 3 L oxygen via nasal cannula.  Continue nebs.  Solu-Medrol has been switched to oral prednisone by PCCM.  Outpatient follow-up with pulmonary. -Uses CPAP at home intermittently.  Patient counseled regarding regular use of CPAP at night.  TOC has been consulted to ensure he has equipment for his CPAP, nasal cannula for his concentrator. -Currently on Rocephin.  Off Zithromax. pulmonary recommended to finish 5-day course of therapy  Chronic diastolic heart failure -Strict input and output.  Daily weights.  Fluid restriction.  Continue Coreg.  Patient received a dose of IV Lasix during this hospitalization.  Losartan on hold. -Outpatient follow-up with  cardiology  Leukocytosis -No labs today.  Still significant leukocytosis on 05/27/2022.  Repeat a.m. labs  Diabetes mellitus type 2 with hyperglycemia -Blood sugars still remain high.  Continue CBGs with SSI.  Start NovoLog with meals.  Carb modified diet.  Acute kidney injury and hyperkalemia: Improved  Transaminitis -Much improved  Morbid obesity -Outpatient follow-up   DVT prophylaxis: Heparin subcutaneous Code Status: Full Family Communication: None at bedside Disposition Plan: Status is: Inpatient Remains inpatient appropriate because: Of severity of illness    Consultants: PCCM  Procedures: Intubation/extubation  Antimicrobials:  Anti-infectives (From admission, onward)    Start     Dose/Rate Route Frequency Ordered Stop   05/25/22 1400  cefTRIAXone (ROCEPHIN) 2 g in sodium chloride 0.9 % 100 mL IVPB        2 g 200 mL/hr over 30 Minutes Intravenous Every 24 hours 05/25/22 1358 05/30/22 1359   05/25/22 1400  azithromycin (ZITHROMAX) 500 mg in sodium chloride 0.9 % 250 mL IVPB  Status:  Discontinued        500 mg 250 mL/hr over 60 Minutes Intravenous Every 24 hours 05/25/22 1358 05/28/22 0737        Subjective: Patient seen and examined at bedside.  Feels slightly better but still feels short of breath with exertion.  Has intermittent cough.  No overnight fever, nausea, vomiting or chest pain reported.  Objective: Vitals:   05/29/22 0500 05/29/22 0754 05/29/22 0758 05/29/22 0942  BP:    123/70  Pulse:    80  Resp:  (!) 26  18  Temp:      TempSrc:      SpO2:  (!) 86% 98% 93%  Weight: (!) 177.2 kg     Height:  Intake/Output Summary (Last 24 hours) at 05/29/2022 1025 Last data filed at 05/29/2022 0900 Gross per 24 hour  Intake 714 ml  Output 700 ml  Net 14 ml   Filed Weights   05/28/22 0355 05/28/22 1325 05/29/22 0500  Weight: (!) 175 kg (!) 177 kg (!) 177.2 kg    Examination:  General exam: Appears calm and comfortable.  Currently on 3 L  oxygen via nasal cannula.  No distress. Respiratory system: Bilateral decreased breath sounds at bases with some scattered crackles, intermittently tachypneic Cardiovascular system: S1 & S2 heard, Rate controlled Gastrointestinal system: Abdomen is morbidly obese, nondistended, soft and nontender. Normal bowel sounds heard. Extremities: No cyanosis, clubbing; mild lower extremity edema present  Central nervous system: Alert and oriented. No focal neurological deficits. Moving extremities Skin: No rashes, lesions or ulcers Psychiatry: Affect is mostly flat.  No signs of agitation.   Data Reviewed: I have personally reviewed following labs and imaging studies  CBC: Recent Labs  Lab 05/25/22 1334 05/26/22 0259 05/27/22 0631  WBC 27.6* 24.8* 31.8*  NEUTROABS 21.2*  --   --   HGB 15.6 14.2 13.6  HCT 50.3 44.5 44.2  MCV 100.0 95.1 95.5  PLT 308 246 301   Basic Metabolic Panel: Recent Labs  Lab 05/25/22 1658 05/25/22 2029 05/26/22 0259 05/26/22 0821 05/26/22 2100 05/27/22 0631 05/27/22 1759 05/28/22 0959 05/28/22 1210 05/28/22 2038 05/29/22 0031 05/29/22 0433 05/29/22 0811  NA 139  --  140  --   --  136  --  140  --   --   --  138  --   K 4.9   < > 4.3   < >  --  4.1  --  3.8 3.9 4.2 4.1 4.1 3.8  CL 94*  --  98  --   --  97*  --  99  --   --   --  98  --   CO2 34*  --  34*  --   --  31  --  33*  --   --   --  35*  --   GLUCOSE 193*  --  212*  --   --  291*  --  182*  --   --   --  183*  --   BUN 24*  --  23*  --   --  31*  --  33*  --   --   --  23*  --   CREATININE 1.38*  --  1.07  --   --  0.92  --  1.07  --   --   --  0.89  --   CALCIUM 8.8*  --  8.9  --   --  8.6*  --  8.6*  --   --   --  8.2*  --   MG  --   --   --   --  2.0 2.2 2.5*  --   --   --   --   --   --   PHOS  --   --   --   --  3.7 3.7 5.2*  --   --   --   --   --   --    < > = values in this interval not displayed.   GFR: Estimated Creatinine Clearance: 174.6 mL/min (by C-G formula based on SCr of 0.89  mg/dL). Liver Function Tests: Recent Labs  Lab 05/25/22 1345 05/28/22 0959  AST 49*  31  ALT 49* 36  ALKPHOS 81 55  BILITOT 1.4* 1.5*  PROT 8.7* 6.8  ALBUMIN 4.3 3.2*   No results for input(s): LIPASE, AMYLASE in the last 168 hours. No results for input(s): AMMONIA in the last 168 hours. Coagulation Profile: Recent Labs  Lab 05/25/22 1411  INR 1.0   Cardiac Enzymes: No results for input(s): CKTOTAL, CKMB, CKMBINDEX, TROPONINI in the last 168 hours. BNP (last 3 results) Recent Labs    06/18/21 1619  PROBNP 16.0   HbA1C: No results for input(s): HGBA1C in the last 72 hours. CBG: Recent Labs  Lab 05/28/22 1607 05/28/22 1948 05/29/22 0018 05/29/22 0430 05/29/22 0713  GLUCAP 252* 247* 196* 197* 150*   Lipid Profile: Recent Labs    05/29/22 0433  TRIG 201*   Thyroid Function Tests: No results for input(s): TSH, T4TOTAL, FREET4, T3FREE, THYROIDAB in the last 72 hours. Anemia Panel: No results for input(s): VITAMINB12, FOLATE, FERRITIN, TIBC, IRON, RETICCTPCT in the last 72 hours. Sepsis Labs: Recent Labs  Lab 05/25/22 1410 05/25/22 1658 05/25/22 2029 05/26/22 0259  LATICACIDVEN 1.3 2.8* 2.6* 1.9    Recent Results (from the past 240 hour(s))  Blood Culture (routine x 2)     Status: None (Preliminary result)   Collection Time: 05/25/22  2:00 PM   Specimen: BLOOD  Result Value Ref Range Status   Specimen Description   Final    BLOOD LEFT ANTECUBITAL Performed at [facility], [address] [place], [place] [address]    Special Requests   Final    BOTTLES DRAWN AEROBIC AND ANAEROBIC Blood Culture adequate volume Performed at [facility], [address], [place], [place] [address]    Culture   Final    NO GROWTH 3 DAYS Performed at [facility], [address], [place], [place] [address]    Report Status PENDING  Incomplete  Blood Culture (routine x 2)     Status: None (Preliminary result)   Collection  Time: 05/25/22  2:17 PM   Specimen: BLOOD  Result Value Ref Range Status   Specimen Description   Final    BLOOD RIGHT ANTECUBITAL Performed at [facility], [address], [place], [place] [address]    Special Requests   Final    BOTTLES DRAWN AEROBIC AND ANAEROBIC Blood Culture results may not be optimal due to an excessive volume of blood received in culture bottles Performed at [facility], [address] [place], [place] [address]    Culture   Final    NO GROWTH 3 DAYS Performed at [facility], [address], [place], [place] [address]    Report Status PENDING  Incomplete  Resp Panel by RT-PCR (Flu A&B, Covid) Anterior Nasal Swab     Status: None   Collection Time: 05/25/22  2:18 PM   Specimen: Anterior Nasal Swab  Result Value Ref Range Status   SARS Coronavirus 2 by RT PCR NEGATIVE NEGATIVE Final    Comment: (NOTE) SARS-CoV-2 target nucleic acids are NOT DETECTED.  The SARS-CoV-2 RNA is generally detectable in upper respiratory specimens during the acute phase of infection. The lowest concentration of SARS-CoV-2 viral copies this assay can detect is 138 copies/mL. A negative result does not preclude SARS-Cov-2 infection and should not be used as the sole basis for treatment or other patient management decisions. A negative result may occur with  improper specimen collection/handling, submission of specimen other than nasopharyngeal swab,  presence of viral mutation(s) within the areas targeted by this assay, and inadequate number of viral copies(<138 copies/mL). A negative result must be combined with clinical observations, patient history, and epidemiological information. The expected result is Negative.  Fact Sheet for Patients:  BloggerCourse.com  Fact Sheet for Healthcare Providers:  SeriousBroker.it  This test is no t yet approved or cleared by the [place] FDA  and  has been authorized for detection and/or diagnosis of SARS-CoV-2 by FDA under an Emergency Use Authorization (EUA). This EUA will remain  in effect (meaning this test can be used) for the duration of the COVID-19 declaration under Section 564(b)(1) of the Act, 21 U.S.C.section 360bbb-3(b)(1), unless the authorization is terminated  or revoked sooner.       Influenza A by PCR NEGATIVE NEGATIVE Final   Influenza B by PCR NEGATIVE NEGATIVE Final    Comment: (NOTE) The Xpert Xpress SARS-CoV-2/FLU/RSV plus assay is intended as an aid in the diagnosis of influenza from Nasopharyngeal swab specimens and should not be used as a sole basis for treatment. Nasal washings and aspirates are unacceptable for Xpert Xpress SARS-CoV-2/FLU/RSV testing.  Fact Sheet for Patients: BloggerCourse.com  Fact Sheet for Healthcare Providers: SeriousBroker.it  This test is not yet approved or cleared by the [place] FDA and has been authorized for detection and/or diagnosis of SARS-CoV-2 by FDA under an Emergency Use Authorization (EUA). This EUA will remain in effect (meaning this test can be used) for the duration of the COVID-19 declaration under Section 564(b)(1) of the Act, 21 U.S.C. section 360bbb-3(b)(1), unless the authorization is terminated or revoked.  Performed at [facility], [address], [place], [place] [address]   MRSA Next Gen by PCR, Nasal     Status: None   Collection Time: 05/25/22  5:10 PM   Specimen: Nasal Mucosa; Nasal Swab  Result Value Ref Range Status   MRSA by PCR Next Gen NOT DETECTED NOT DETECTED Final    Comment: (NOTE) The GeneXpert MRSA Assay (FDA approved for NASAL specimens only), is one component of a comprehensive MRSA colonization surveillance program. It is not intended to diagnose MRSA infection nor to guide or monitor treatment for MRSA infections. Test performance is not FDA  approved in patients less than 16 years old. Performed at [facility], [address], [place], [place] [address]   Culture, Respiratory w Gram Stain     Status: None (Preliminary result)   Collection Time: 05/27/22 10:21 AM   Specimen: Tracheal Aspirate; Respiratory  Result Value Ref Range Status   Specimen Description   Final    TRACHEAL ASPIRATE Performed at [facility], [address], [place], [place] [address]    Special Requests   Final    NONE Performed at [facility], [address], [place], [place] [address]    Gram Stain   Final    NO WBC SEEN RARE BUDDING YEAST SEEN RARE GRAM POSITIVE COCCI IN PAIRS    Culture   Final    CULTURE REINCUBATED FOR BETTER GROWTH Performed at [facility], [address], [place], [place] [address]    Report Status PENDING  Incomplete         Radiology Studies: No results found.      Scheduled Meds:  arformoterol  15 mcg Nebulization BID   budesonide (PULMICORT) nebulizer solution  0.5 mg Nebulization BID   buPROPion  300 mg Oral Daily   carvedilol  6.25  mg Oral BID WC   chlorhexidine  15 mL Mouth Rinse BID   Chlorhexidine Gluconate Cloth  6 each Topical Daily   heparin  5,000 Units Subcutaneous Q8H   insulin aspart  0-15 Units Subcutaneous Q4H   mouth rinse  15 mL Mouth Rinse q12n4p   pantoprazole  40 mg Oral Daily   predniSONE  60 mg Oral Q breakfast   revefenacin  175 mcg Nebulization Daily   Continuous Infusions:  cefTRIAXone (ROCEPHIN)  IV 2 g (05/28/22 1557)          Glade LloydKshitiz Audreyana Huntsberry, MD [facility] 05/29/2022, 10:25 AM

## 2022-05-29 NOTE — TOC Progression Note (Signed)
Transition of Care [facility]) - Progression Note    Patient Details  Name: Clayborne Salvia MRN: [id number] Date of Birth: September 03, 1971  Transition of [facility]) CM/SW Contact  Ebonee Stober, Juliann Pulse, RN Phone Number: 05/29/2022, 12:46 PM  Clinical Narrative: Non Invasive Vent f/u by [employer] for auth once order signed. MD updated.      Expected Discharge Plan: Home/Self Care Barriers to Discharge: Continued Medical Work up  Expected Discharge Plan and Services Expected Discharge Plan: Home/Self Care   Discharge Planning Services: CM Consult   Living arrangements for the past 2 months: Single Family Home                 DME Arranged: NIV DME Agency: [employer] Date DME Agency Contacted: 05/29/22 Time DME Agency Contacted: [phone] Representative spoke with at DME Agency: Andee Poles             Social Determinants of Health ([place]) Interventions    Readmission Risk Interventions     View : No data to display.

## 2022-05-29 NOTE — Progress Notes (Signed)
SATURATION QUALIFICATIONS: (This note is used to comply with regulatory documentation for home oxygen)  Patient Saturations on Room Air at Rest = N/A%  Patient Saturations on Room Air while Ambulating = N/A%  Patient Saturations on 4 Liters of oxygen while Ambulating = 86-89%  Please briefly explain why patient needs home oxygen: The patient ambulated 180 feet, activity tolerated well. O2 sat decreased 86-89% on 4 liters [place].

## 2022-05-29 NOTE — Progress Notes (Signed)
Nutrition Follow-up  DOCUMENTATION CODES:   Morbid obesity  INTERVENTION:  - will order Ensure Plus High Protein once/day, each supplement provides 350 kcal and 20 grams of protein.  - will place order in [employer] [facility] for double protein portions at meals.    NUTRITION DIAGNOSIS:   Increased nutrient needs related to acute illness as evidenced by estimated needs. -revised, ongoing  GOAL:   Patient will meet greater than or equal to 90% of their needs -progressing  MONITOR:   PO intake, Supplement acceptance, Labs, Weight trends  ASSESSMENT:   51 year old male with medical history of asthma, diastolic heart failure, obesity, hypoventilation syndrome, COPD, and HTN. He presented to the ED due to AMS and acute hypoxic and hypercarbic respiratory failure. He was admitted for acute respiratory failure. He failed trial of BiPAP and in the ED and was subsequently intubated before being transferred to the ICU.  Patient extubated and OGT removed on 5/30 evening. Estimated nutrition needs updated at time of composing this note. Diet advanced from NPO to Regular yesterday at 0730. The only documented meal intake was 100% of breakfast this AM (635 kcal and 25 grams protein).  Patient laying in bed with no visitors present at the time of RD visit. Patient denies any oral or esophageal pain or discomfort with swallowing.   He shares that he and his mom are very supportive and that he has communicated to them need for him to begin eating healthier moving forward. His mom and sister have shared with him that they are receptive to making these changes along with him.  He denies any nutrition-related questions or concerns at this time. Patient is aware that RD is happy to stop back at any time if questions or concerns arise or if there is any way that RD can support him.  Weight has been mainly stable since 5/29. Non-pitting edema to BUE and mild pitting edema to BLE documented in the edema section  of flow sheet.      Labs reviewed; CBGs: 196, 197, 150 mg/dl, BUN: 23 mg/dl, Ca: 8.2 mg/dl.   Medications reviewed; sliding scale novolog, 40 mg oral protonix/day, 60 mg deltasone/day.   Diet Order:   Diet Order             Diet regular Room service appropriate? Yes; Fluid consistency: Thin  Diet effective now                   EDUCATION NEEDS:   No education needs have been identified at this time  Skin:  Skin Assessment: Reviewed RN Assessment  Last BM:  PTA/unknown  Height:   Ht Readings from Last 1 Encounters:  05/28/22 6\' 3"  (1.905 m)    Weight:   Wt Readings from Last 1 Encounters:  05/29/22 (!) 177.2 kg     BMI:  Body mass index is 48.83 kg/m.  Estimated Nutritional Needs:  Kcal:  2500-2700 kcal Protein:  125-140 grams Fluid:  >/= 2.5 L/day     07/29/22, MS, RD, LDN Registered Dietitian II Inpatient Clinical Nutrition RD pager # and on-call/weekend pager # available in [facility]

## 2022-05-30 ENCOUNTER — Encounter (HOSPITAL_COMMUNITY): Payer: Self-pay | Admitting: Pulmonary Disease

## 2022-05-30 DIAGNOSIS — J441 Chronic obstructive pulmonary disease with (acute) exacerbation: Secondary | ICD-10-CM

## 2022-05-30 DIAGNOSIS — J189 Pneumonia, unspecified organism: Secondary | ICD-10-CM

## 2022-05-30 DIAGNOSIS — J9621 Acute and chronic respiratory failure with hypoxia: Secondary | ICD-10-CM

## 2022-05-30 DIAGNOSIS — R7401 Elevation of levels of liver transaminase levels: Secondary | ICD-10-CM

## 2022-05-30 DIAGNOSIS — I5033 Acute on chronic diastolic (congestive) heart failure: Secondary | ICD-10-CM

## 2022-05-30 LAB — CBC WITH DIFFERENTIAL/PLATELET
Abs Immature Granulocytes: 0.56 10*3/uL — ABNORMAL HIGH (ref 0.00–0.07)
Basophils Absolute: 0.1 10*3/uL (ref 0.0–0.1)
Basophils Relative: 0 %
Eosinophils Absolute: 0.2 10*3/uL (ref 0.0–0.5)
Eosinophils Relative: 1 %
HCT: 47 % (ref 39.0–52.0)
Hemoglobin: 14.2 g/dL (ref 13.0–17.0)
Immature Granulocytes: 2 %
Lymphocytes Relative: 14 %
Lymphs Abs: 3.3 10*3/uL (ref 0.7–4.0)
MCH: 29.5 pg (ref 26.0–34.0)
MCHC: 30.2 g/dL (ref 30.0–36.0)
MCV: 97.7 fL (ref 80.0–100.0)
Monocytes Absolute: 1.7 10*3/uL — ABNORMAL HIGH (ref 0.1–1.0)
Monocytes Relative: 7 %
Neutro Abs: 18.2 10*3/uL — ABNORMAL HIGH (ref 1.7–7.7)
Neutrophils Relative %: 76 %
Platelets: 252 10*3/uL (ref 150–400)
RBC: 4.81 MIL/uL (ref 4.22–5.81)
RDW: 13.9 % (ref 11.5–15.5)
WBC: 24 10*3/uL — ABNORMAL HIGH (ref 4.0–10.5)
nRBC: 0 % (ref 0.0–0.2)

## 2022-05-30 LAB — GLUCOSE, CAPILLARY
Glucose-Capillary: 131 mg/dL — ABNORMAL HIGH (ref 70–99)
Glucose-Capillary: 149 mg/dL — ABNORMAL HIGH (ref 70–99)
Glucose-Capillary: 156 mg/dL — ABNORMAL HIGH (ref 70–99)
Glucose-Capillary: 200 mg/dL — ABNORMAL HIGH (ref 70–99)
Glucose-Capillary: 241 mg/dL — ABNORMAL HIGH (ref 70–99)
Glucose-Capillary: 85 mg/dL (ref 70–99)

## 2022-05-30 LAB — CULTURE, BLOOD (ROUTINE X 2)
Culture: NO GROWTH
Culture: NO GROWTH
Special Requests: ADEQUATE

## 2022-05-30 LAB — BASIC METABOLIC PANEL
Anion gap: 6 (ref 5–15)
BUN: 21 mg/dL — ABNORMAL HIGH (ref 6–20)
CO2: 34 mmol/L — ABNORMAL HIGH (ref 22–32)
Calcium: 8.3 mg/dL — ABNORMAL LOW (ref 8.9–10.3)
Chloride: 97 mmol/L — ABNORMAL LOW (ref 98–111)
Creatinine, Ser: 0.89 mg/dL (ref 0.61–1.24)
GFR, Estimated: >60 mL/min (ref >60)
Glucose, Bld: 124 mg/dL — ABNORMAL HIGH (ref 70–99)
Potassium: 4 mmol/L (ref 3.5–5.1)
Sodium: 137 mmol/L (ref 135–145)

## 2022-05-30 LAB — MAGNESIUM: Magnesium: 2.5 mg/dL — ABNORMAL HIGH (ref 1.7–2.4)

## 2022-05-30 MED ORDER — PREDNISONE 50 MG PO TABS
50.0000 mg | ORAL_TABLET | Freq: Every day | ORAL | Status: DC
  Administered 2022-05-31: 50 mg via ORAL
  Filled 2022-05-30: qty 1

## 2022-05-30 NOTE — Progress Notes (Signed)
PROGRESS NOTE    Joseph Morales  [id number] DOB: 31-Jul-1971 DOA: 05/25/2022 PCP: Gabriel Earing, MD   Brief Narrative:  51 year old male with history of asthma/COPD, chronic respiratory failure with hypoxia, chronic diastolic heart failure, obesity hypoventilation syndrome, hypertension presents with altered mental status, acute on chronic hypoxic and hypercarbic respiratory failure.  He was tried on BiPAP in the ED but failed and was intubated and was admitted to ICU under PCCM service.  COVID-19 and influenza testing was negative.  Chest x-ray showed bilateral patchy atelectasis or infiltrates.  He was started on Solu-Medrol, IV antibiotics and IV fluids.  Subsequently, IV fluids was discontinued and he received a dose of Lasix.  He was extubated on 05/27/2022.  He was transferred to [facility] service from 05/29/2022 onwards.  Assessment & Plan:   Acute on chronic respiratory failure with hypoxia and hypercarbia COPD/asthma exacerbation Possible community-acquired pneumonia Obesity hypoventilation syndrome -Normally wears 2 L oxygen at rest and 4 L oxygen with exertion.   -Continue BiPAP on presentation and required intubation.  Extubated on 05/27/2022.  Transferred to [facility] service from 05/29/2022 onwards. -Currently on 4 L oxygen via nasal cannula.  Continue nebs.  Solu-Medrol has been switched to oral prednisone by PCCM.  Outpatient follow-up with pulmonary. -Uses CPAP at home intermittently.  Patient counseled regarding regular use of CPAP at night.  TOC has been consulted to ensure he has equipment for his CPAP, nasal cannula for his concentrator. -Currently on Rocephin.  Off Zithromax. pulmonary recommended to finish 5-day course of therapy  Acute on chronic diastolic heart failure -Strict input and output.  Daily weights.  Fluid restriction.  Continue Coreg.  Continue Lasix 40 mg IV every 12 hours for now.  Losartan on hold. -Outpatient follow-up with  cardiology  Leukocytosis -Improving.  Repeat a.m. labs  Diabetes mellitus type 2 with hyperglycemia -Blood sugars still remain high.  Continue CBGs with SSI.  Carb modified diet.  Acute kidney injury and hyperkalemia: Improved  Transaminitis -Much improved  Morbid obesity -Outpatient follow-up   DVT prophylaxis: Heparin subcutaneous Code Status: Full Family Communication: None at bedside Disposition Plan: Status is: Inpatient Remains inpatient appropriate because: Of severity of illness.  Possible discharge in 1 to 2 days if respiratory status remains stable    Consultants: PCCM  Procedures: Intubation/extubation  Antimicrobials:  Anti-infectives (From admission, onward)    Start     Dose/Rate Route Frequency Ordered Stop   05/25/22 1400  cefTRIAXone (ROCEPHIN) 2 g in sodium chloride 0.9 % 100 mL IVPB        2 g 200 mL/hr over 30 Minutes Intravenous Every 24 hours 05/25/22 1358 05/29/22 1645   05/25/22 1400  azithromycin (ZITHROMAX) 500 mg in sodium chloride 0.9 % 250 mL IVPB  Status:  Discontinued        500 mg 250 mL/hr over 60 Minutes Intravenous Every 24 hours 05/25/22 1358 05/28/22 0737        Subjective: Patient seen and examined at bedside.  No fever, vomiting, chest pain reported.  Still short of breath with exertion but feeling better.  Objective: Vitals:   05/30/22 0309 05/30/22 0452 05/30/22 0454 05/30/22 0750  BP:  121/74    Pulse: 77 64    Resp: 17 19    Temp:  98 F (36.7 C)    TempSrc:      SpO2: 98% 100%  100%  Weight:   (!) 176.6 kg   Height:        Intake/Output Summary (Last  24 hours) at 05/30/2022 0803 Last data filed at 05/30/2022 0455 Gross per 24 hour  Intake 1080 ml  Output 3425 ml  Net -2345 ml    Filed Weights   05/28/22 1325 05/29/22 0500 05/30/22 0454  Weight: (!) 177 kg (!) 177.2 kg (!) 176.6 kg    Examination:  General: On 4 L oxygen.  No distress ENT/neck: No thyromegaly.  JVD is not elevated  respiratory:  Decreased breath sounds at bases bilaterally with basilar crackles; no wheezing  CVS: S1-S2 heard, rate controlled Abdominal: Soft, morbidly obese, nontender, slightly distended; no organomegaly, normal bowel sounds are heard Extremities: Trace lower extremity edema; no cyanosis  CNS: Awake and alert.  No focal neurologic deficit.  Moves extremities Lymph: No obvious lymphadenopathy Skin: No obvious ecchymosis/lesions  psych: Affect is flat.  No agitation noted.   Musculoskeletal: No obvious joint swelling/deformity    Data Reviewed: I have personally reviewed following labs and imaging studies  CBC: Recent Labs  Lab 05/25/22 1334 05/26/22 0259 05/27/22 0631 05/30/22 0435  WBC 27.6* 24.8* 31.8* 24.0*  NEUTROABS 21.2*  --   --  18.2*  HGB 15.6 14.2 13.6 14.2  HCT 50.3 44.5 44.2 47.0  MCV 100.0 95.1 95.5 97.7  PLT 308 246 301 252    Basic Metabolic Panel: Recent Labs  Lab 05/26/22 0259 05/26/22 0821 05/26/22 2100 05/27/22 0631 05/27/22 1759 05/28/22 0959 05/28/22 1210 05/28/22 2038 05/29/22 0031 05/29/22 0433 05/29/22 0811 05/30/22 0435  NA 140  --   --  136  --  140  --   --   --  138  --  137  K 4.3   < >  --  4.1  --  3.8   < > 4.2 4.1 4.1 3.8 4.0  CL 98  --   --  97*  --  99  --   --   --  98  --  97*  CO2 34*  --   --  31  --  33*  --   --   --  35*  --  34*  GLUCOSE 212*  --   --  291*  --  182*  --   --   --  183*  --  124*  BUN 23*  --   --  31*  --  33*  --   --   --  23*  --  21*  CREATININE 1.07  --   --  0.92  --  1.07  --   --   --  0.89  --  0.89  CALCIUM 8.9  --   --  8.6*  --  8.6*  --   --   --  8.2*  --  8.3*  MG  --   --  2.0 2.2 2.5*  --   --   --   --   --   --  2.5*  PHOS  --   --  3.7 3.7 5.2*  --   --   --   --   --   --   --    < > = values in this interval not displayed.    GFR: Estimated Creatinine Clearance: 174.2 mL/min (by C-G formula based on SCr of 0.89 mg/dL). Liver Function Tests: Recent Labs  Lab 05/25/22 1345  05/28/22 0959  AST 49* 31  ALT 49* 36  ALKPHOS 81 55  BILITOT 1.4* 1.5*  PROT 8.7* 6.8  ALBUMIN 4.3 3.2*  No results for input(s): LIPASE, AMYLASE in the last 168 hours. No results for input(s): AMMONIA in the last 168 hours. Coagulation Profile: Recent Labs  Lab 05/25/22 1411  INR 1.0    Cardiac Enzymes: No results for input(s): CKTOTAL, CKMB, CKMBINDEX, TROPONINI in the last 168 hours. BNP (last 3 results) Recent Labs    06/18/21 1619  PROBNP 16.0    HbA1C: No results for input(s): HGBA1C in the last 72 hours. CBG: Recent Labs  Lab 05/29/22 1752 05/29/22 1950 05/30/22 0006 05/30/22 0434 05/30/22 0714  GLUCAP 301* 267* 156* 131* 85    Lipid Profile: Recent Labs    05/29/22 0433  TRIG 201*    Thyroid Function Tests: No results for input(s): TSH, T4TOTAL, FREET4, T3FREE, THYROIDAB in the last 72 hours. Anemia Panel: No results for input(s): VITAMINB12, FOLATE, FERRITIN, TIBC, IRON, RETICCTPCT in the last 72 hours. Sepsis Labs: Recent Labs  Lab 05/25/22 1410 05/25/22 1658 05/25/22 2029 05/26/22 0259  LATICACIDVEN 1.3 2.8* 2.6* 1.9     Recent Results (from the past 240 hour(s))  Blood Culture (routine x 2)     Status: None (Preliminary result)   Collection Time: 05/25/22  2:00 PM   Specimen: BLOOD  Result Value Ref Range Status   Specimen Description   Final    BLOOD LEFT ANTECUBITAL Performed at [facility], [address], [place], [place] [address]    Special Requests   Final    BOTTLES DRAWN AEROBIC AND ANAEROBIC Blood Culture adequate volume Performed at [facility], [address] [place], [place] [address]    Culture   Final    NO GROWTH 4 DAYS Performed at [facility], [address] [place], [place] [address]    Report Status PENDING  Incomplete  Blood Culture (routine x 2)     Status: None (Preliminary result)   Collection Time: 05/25/22  2:17 PM   Specimen: BLOOD  Result  Value Ref Range Status   Specimen Description   Final    BLOOD RIGHT ANTECUBITAL Performed at [facility], [address], [place], [place] [address]    Special Requests   Final    BOTTLES DRAWN AEROBIC AND ANAEROBIC Blood Culture results may not be optimal due to an excessive volume of blood received in culture bottles Performed at [facility], [address], [place], [place] [address]    Culture   Final    NO GROWTH 4 DAYS Performed at [facility], [address], [place], [place] [address]    Report Status PENDING  Incomplete  Resp Panel by RT-PCR (Flu A&B, Covid) Anterior Nasal Swab     Status: None   Collection Time: 05/25/22  2:18 PM   Specimen: Anterior Nasal Swab  Result Value Ref Range Status   SARS Coronavirus 2 by RT PCR NEGATIVE NEGATIVE Final    Comment: (NOTE) SARS-CoV-2 target nucleic acids are NOT DETECTED.  The SARS-CoV-2 RNA is generally detectable in upper respiratory specimens during the acute phase of infection. The lowest concentration of SARS-CoV-2 viral copies this assay can detect is 138 copies/mL. A negative result does not preclude SARS-Cov-2 infection and should not be used as the sole basis for treatment or other patient management decisions. A negative result may occur with  improper specimen collection/handling, submission of specimen other than nasopharyngeal swab, presence of viral mutation(s) within the areas targeted by this assay, and inadequate number of viral copies(<138 copies/mL).  A negative result must be combined with clinical observations, patient history, and epidemiological information. The expected result is Negative.  Fact Sheet for Patients:  BloggerCourse.com  Fact Sheet for Healthcare Providers:  SeriousBroker.it  This test is no t yet approved or cleared by the [place] FDA and  has been authorized for detection and/or  diagnosis of SARS-CoV-2 by FDA under an Emergency Use Authorization (EUA). This EUA will remain  in effect (meaning this test can be used) for the duration of the COVID-19 declaration under Section 564(b)(1) of the Act, 21 U.S.C.section 360bbb-3(b)(1), unless the authorization is terminated  or revoked sooner.       Influenza A by PCR NEGATIVE NEGATIVE Final   Influenza B by PCR NEGATIVE NEGATIVE Final    Comment: (NOTE) The Xpert Xpress SARS-CoV-2/FLU/RSV plus assay is intended as an aid in the diagnosis of influenza from Nasopharyngeal swab specimens and should not be used as a sole basis for treatment. Nasal washings and aspirates are unacceptable for Xpert Xpress SARS-CoV-2/FLU/RSV testing.  Fact Sheet for Patients: BloggerCourse.com  Fact Sheet for Healthcare Providers: SeriousBroker.it  This test is not yet approved or cleared by the [place] FDA and has been authorized for detection and/or diagnosis of SARS-CoV-2 by FDA under an Emergency Use Authorization (EUA). This EUA will remain in effect (meaning this test can be used) for the duration of the COVID-19 declaration under Section 564(b)(1) of the Act, 21 U.S.C. section 360bbb-3(b)(1), unless the authorization is terminated or revoked.  Performed at [facility], [address], [place], [place] [address]   MRSA Next Gen by PCR, Nasal     Status: None   Collection Time: 05/25/22  5:10 PM   Specimen: Nasal Mucosa; Nasal Swab  Result Value Ref Range Status   MRSA by PCR Next Gen NOT DETECTED NOT DETECTED Final    Comment: (NOTE) The GeneXpert MRSA Assay (FDA approved for NASAL specimens only), is one component of a comprehensive MRSA colonization surveillance program. It is not intended to diagnose MRSA infection nor to guide or monitor treatment for MRSA infections. Test performance is not FDA approved in patients less than 49  years old. Performed at [facility], [address], [place], [place] [address]   Culture, Respiratory w Gram Stain     Status: None   Collection Time: 05/27/22 10:21 AM   Specimen: Tracheal Aspirate; Respiratory  Result Value Ref Range Status   Specimen Description   Final    TRACHEAL ASPIRATE Performed at [facility], [address] [place], [place] [address]    Special Requests   Final    NONE Performed at [facility], [address], [place], [place] [address]    Gram Stain   Final    NO WBC SEEN RARE BUDDING YEAST SEEN RARE GRAM POSITIVE COCCI IN PAIRS    Culture   Final    Normal respiratory flora-no Staph aureus or Pseudomonas seen Performed at [facility], [address] [place], [place] [address]    Report Status 05/29/2022 FINAL  Final          Radiology Studies: No results found.      Scheduled Meds:  arformoterol  15 mcg Nebulization BID   budesonide (PULMICORT) nebulizer solution  0.5 mg Nebulization BID   buPROPion  300 mg Oral Daily   carvedilol  6.25 mg Oral BID WC   chlorhexidine  15 mL Mouth Rinse BID  Chlorhexidine Gluconate Cloth  6 each Topical Daily   feeding supplement  237 mL Oral Q24H   furosemide  40 mg Intravenous BID   heparin  5,000 Units Subcutaneous Q8H   insulin aspart  0-15 Units Subcutaneous Q4H   mouth rinse  15 mL Mouth Rinse q12n4p   pantoprazole  40 mg Oral Daily   predniSONE  60 mg Oral Q breakfast   revefenacin  175 mcg Nebulization Daily   Continuous Infusions:          Glade LloydKshitiz Nisaiah Bechtol, MD [facility] 05/30/2022, 8:03 AM

## 2022-05-30 NOTE — TOC Progression Note (Addendum)
Transition of Care [facility]) - Progression Note    Patient Details  Name: Joseph Morales MRN: [id number] Date of Birth: September 13, 1971  [facility] CM/SW Contact  Karma Hiney, Juliann Pulse, RN Phone Number: 05/30/2022, 1:22 PM  Clinical Narrative:  [employer] rep Zach/Danielle still following with insurance for auth on NIV-no auth currently likely by Monday for the outcome. Informed patient of status, also informed hime that the Dr decides medical stability-patient voiced understanding.    Expected Discharge Plan: Home/Self Care Barriers to Discharge: Continued Medical Work up  Expected Discharge Plan and Services Expected Discharge Plan: Home/Self Care   Discharge Planning Services: CM Consult   Living arrangements for the past 2 months: Single Family Home                 DME Arranged: NIV DME Agency: [employer] Date DME Agency Contacted: 05/29/22 Time DME Agency Contacted: [phone] Representative spoke with at DME Agency: Andee Poles             Social Determinants of Health ([place]) Interventions    Readmission Risk Interventions     View : No data to display.

## 2022-05-30 NOTE — Progress Notes (Signed)
SATURATION QUALIFICATIONS: (This note is used to comply with regulatory documentation for home oxygen)   Patient Saturations on 4 Liters of oxygen while Ambulating = 90%  Please briefly explain why patient needs home oxygen:  Patient uses 3-4 L of O2 at home. Patient desats below 85% without supplemental oxygen.  Sinclair Ship, RN

## 2022-05-30 NOTE — Evaluation (Signed)
Physical Therapy Evaluation Patient Details Name: Rushton Divan MRN: [id number] DOB: 31-Oct-1971 Today's Date: 05/30/2022  History of Present Illness  51 year old male with history of asthma/COPD, chronic respiratory failure with hypoxia, chronic diastolic heart failure, obesity hypoventilation syndrome, hypertension presents with altered mental status, acute on chronic hypoxic and hypercarbic respiratory failure.  He was tried on BiPAP in the ED but failed and was intubated and was admitted to ICU under PCCM service.  COVID-19 and influenza testing was negative.  Chest x-ray showed bilateral patchy atelectasis or infiltrates.  extubated 5/30 and transferred to [facility] service  Clinical Impression  Patient evaluated by Physical Therapy with no further acute PT needs identified. All education has been completed and the patient has no further questions.  Pt is pleasant and cooperative, amb hallway distance, denies significant dyspnea; pt uses 3-4L O2 at home prn per his report.  Resting SpO2=90%, 98-100% with amb, 89% at lowest after seated rest. on 4L throughout Pt feels he is back to his baseline. Encouraged pt to continue to mobilize.  No further PT needs  See below for any follow-up Physical Therapy or equipment needs. PT is signing off. Thank you for this referral.        Recommendations for follow up therapy are one component of a multi-disciplinary discharge planning process, led by the attending physician.  Recommendations may be updated based on patient status, additional functional criteria and insurance authorization.  Follow Up Recommendations No PT follow up    Assistance Recommended at Discharge PRN  Patient can return home with the following       Equipment Recommendations None recommended by PT  Recommendations for Other Services       Functional Status Assessment Patient has not had a recent decline in their functional status     Precautions / Restrictions  Precautions Precaution Comments: monitor O2 Restrictions Weight Bearing Restrictions: No      Mobility  Bed Mobility               General bed mobility comments: NT--pt in recliner, reports Independence    Transfers Overall transfer level: Independent   Transfers: Sit to/from Stand Sit to Stand: Independent                Ambulation/Gait Ambulation/Gait assistance: Independent Gait Distance (Feet): 180 Feet Assistive device: None Gait Pattern/deviations: Step-through pattern       General Gait Details: assist only for O2 tank;  resting SpO2=90%, 98-100% with amb, 89% at lowest after seated rest. on 4L throughout  Stairs            Wheelchair Mobility    Modified Rankin (Stroke Patients Only)       Balance Overall balance assessment: Independent                                           Pertinent Vitals/Pain Pain Assessment Pain Assessment: No/denies pain    Home Living Family/patient expects to be discharged to:: Private residence Living Arrangements: Parent;Other relatives Available Help at Discharge: Family Type of Home: House Home Access: Level entry       Home Layout: One level Home Equipment: None      Prior Function Prior Level of Function : Independent/Modified Independent                     Hand Dominance  Extremity/Trunk Assessment   Upper Extremity Assessment Upper Extremity Assessment: Overall WFL for tasks assessed    Lower Extremity Assessment Lower Extremity Assessment: Overall WFL for tasks assessed       Communication   Communication: No difficulties  Cognition Arousal/Alertness: Awake/alert Behavior During Therapy: WFL for tasks assessed/performed Overall Cognitive Status: Within Functional Limits for tasks assessed                                          General Comments      Exercises     Assessment/Plan    PT Assessment Patient does not  need any further PT services  PT Problem List         PT Treatment Interventions      PT Goals (Current goals can be found in the Care Plan section)  Acute Rehab PT Goals Patient Stated Goal: home soon PT Goal Formulation: All assessment and education complete, DC therapy    Frequency       Co-evaluation               AM-PAC PT "6 Clicks" Mobility  Outcome Measure Help needed turning from your back to your side while in a flat bed without using bedrails?: None Help needed moving from lying on your back to sitting on the side of a flat bed without using bedrails?: None Help needed moving to and from a bed to a chair (including a wheelchair)?: None Help needed standing up from a chair using your arms (e.g., wheelchair or bedside chair)?: None Help needed to walk in hospital room?: None Help needed climbing 3-5 steps with a railing? : None 6 Click Score: 24    End of Session   Activity Tolerance: Patient tolerated treatment well Patient left: in chair;with call bell/phone within reach   PT Visit Diagnosis: Difficulty in walking, not elsewhere classified (R26.2)    Time: [id number] PT Time Calculation (min) (ACUTE ONLY): 10 min   Charges:   PT Evaluation $PT Eval Low Complexity: 1 Low          Kannon Granderson, PT  Acute Rehab Dept ([place]) [phone] Pager [phone]  05/30/2022   [facility] 05/30/2022, 10:37 AM

## 2022-05-31 DIAGNOSIS — E662 Morbid (severe) obesity with alveolar hypoventilation: Secondary | ICD-10-CM

## 2022-05-31 LAB — GLUCOSE, CAPILLARY
Glucose-Capillary: 101 mg/dL — ABNORMAL HIGH (ref 70–99)
Glucose-Capillary: 128 mg/dL — ABNORMAL HIGH (ref 70–99)
Glucose-Capillary: 151 mg/dL — ABNORMAL HIGH (ref 70–99)
Glucose-Capillary: 153 mg/dL — ABNORMAL HIGH (ref 70–99)

## 2022-05-31 LAB — HEPATIC FUNCTION PANEL
ALT: 82 U/L — ABNORMAL HIGH (ref 0–44)
AST: 35 U/L (ref 15–41)
Albumin: 3.2 g/dL — ABNORMAL LOW (ref 3.5–5.0)
Alkaline Phosphatase: 55 U/L (ref 38–126)
Bilirubin, Direct: 0.2 mg/dL (ref 0.0–0.2)
Indirect Bilirubin: 1.3 mg/dL — ABNORMAL HIGH (ref 0.3–0.9)
Total Bilirubin: 1.5 mg/dL — ABNORMAL HIGH (ref 0.3–1.2)
Total Protein: 6.5 g/dL (ref 6.5–8.1)

## 2022-05-31 LAB — BASIC METABOLIC PANEL
Anion gap: 8 (ref 5–15)
BUN: 21 mg/dL — ABNORMAL HIGH (ref 6–20)
CO2: 38 mmol/L — ABNORMAL HIGH (ref 22–32)
Calcium: 8.6 mg/dL — ABNORMAL LOW (ref 8.9–10.3)
Chloride: 93 mmol/L — ABNORMAL LOW (ref 98–111)
Creatinine, Ser: 0.9 mg/dL (ref 0.61–1.24)
GFR, Estimated: >60 mL/min (ref >60)
Glucose, Bld: 78 mg/dL (ref 70–99)
Potassium: 3.5 mmol/L (ref 3.5–5.1)
Sodium: 139 mmol/L (ref 135–145)

## 2022-05-31 LAB — MAGNESIUM: Magnesium: 2.5 mg/dL — ABNORMAL HIGH (ref 1.7–2.4)

## 2022-05-31 MED ORDER — CARVEDILOL 12.5 MG PO TABS: 6.2500 mg | ORAL_TABLET | Freq: Two times a day (BID) | ORAL | Status: AC

## 2022-05-31 MED ORDER — PREDNISONE 20 MG PO TABS: 40.0000 mg | ORAL_TABLET | Freq: Every day | ORAL | 0 refills | Status: AC

## 2022-05-31 NOTE — Progress Notes (Signed)
Patient discharged home via wheelchair with mother.

## 2022-05-31 NOTE — Discharge Summary (Addendum)
Physician Discharge Summary  Joseph Morales AJG:[id number] DOB: 03-12-1971 DOA: 05/25/2022  PCP: Gabriel Earing, MD  Admit date: 05/25/2022 Discharge date: 05/31/2022  Admitted From: Home Disposition: Home  Recommendations for Outpatient Follow-up:  Follow up with PCP in 1 week with repeat CBC/BMP Outpatient follow-up with pulmonary and cardiology Follow up in ED if symptoms worsen or new appear   Home Health: No Equipment/Devices: TOC working on arranging NIV as an outpatient.  Continue supplemental oxygen via nasal cannula 2 to 4 L/min  Discharge Condition: Stable CODE STATUS: Full Diet recommendation: Heart healthy/carb modified/fluid restriction of up to 1500 cc a day  Brief/Interim Summary: 51 year old male with history of asthma/COPD, chronic respiratory failure with hypoxia, chronic diastolic heart failure, obesity hypoventilation syndrome, hypertension presents with altered mental status, acute on chronic hypoxic and hypercarbic respiratory failure.  He was tried on BiPAP in the ED but failed and was intubated and was admitted to ICU under PCCM service.  COVID-19 and influenza testing was negative.  Chest x-ray showed bilateral patchy atelectasis or infiltrates.  He was started on Solu-Medrol, IV antibiotics and IV fluids.  Subsequently, IV fluids was discontinued and he received a dose of Lasix.  He was extubated on 05/27/2022.  He was transferred to [facility] service from 05/29/2022 onwards.  His respiratory status has subsequently remained stable.  He was also started on IV Lasix.  TOC is working on arranging NIV as an outpatient as per pulmonary recommendations.  He will be discharged home today with outpatient follow-up with PCCM/cardiology and PCP.  Discharge Diagnoses:   Acute on chronic respiratory failure with hypoxia and hypercarbia COPD/asthma exacerbation Possible community-acquired pneumonia Obesity hypoventilation syndrome -Normally wears 2 L oxygen at rest and 4 L  oxygen with exertion.   -Continue BiPAP on presentation and required intubation.  Extubated on 05/27/2022.  Transferred to [facility] service from 05/29/2022 onwards. -Currently on 4 L oxygen via nasal cannula.  Continue nebs.  Solu-Medrol has been switched to oral prednisone by PCCM.  Outpatient follow-up with pulmonary. -Uses CPAP at home intermittently.  Patient counseled regarding regular use of CPAP at night.  -Completed 5-day course of Rocephin as well.  Off Zithromax.  - TOC is working on arranging NIV as an outpatient as per pulmonary recommendations.  He will be discharged home today with outpatient follow-up with PCCM/cardiology and PCP.  As per [facility], adapt will contact the patient on Monday.   Acute on chronic diastolic heart failure -Strict input and output.  Daily weights.  Fluid restriction.  Continue Coreg.  Treated with IV Lasix 40 mg twice daily for few days.  Diuresing well. -Outpatient follow-up with cardiology -Discharge home on home regimen of Coreg, Lasix and losartan   Leukocytosis -Improving.  No labs today   Diabetes mellitus type 2 with hyperglycemia -Carb modified diet.  Continue home regimen.   Acute kidney injury and hyperkalemia: Improved   Transaminitis -Improving.  Hold statin till reevaluation by PCP   morbid obesity -Outpatient follow-up  Acute metabolic encephalopathy -possibly from hypoxia. Resolved.   Discharge Instructions  Discharge Instructions     Ambulatory referral to Pulmonology   Complete by: As directed    Reason for referral: Asthma/COPD   Diet - low sodium heart healthy   Complete by: As directed    Diet Carb Modified   Complete by: As directed    Increase activity slowly   Complete by: As directed       Allergies as of 05/31/2022   No Known Allergies  Medication List     STOP taking these medications    rosuvastatin 5 MG tablet Commonly known as: CRESTOR       TAKE these medications    albuterol 108 (90 Base)  MCG/ACT inhaler Commonly known as: VENTOLIN HFA Inhale 2 puffs into the lungs every 6 (six) hours as needed for wheezing or shortness of breath.   albuterol (2.5 MG/3ML) 0.083% nebulizer solution Commonly known as: PROVENTIL Use 1 vial (2.5 mg total) by nebulization every 2 (two) hours as needed for wheezing.   budesonide-formoterol 160-4.5 MCG/ACT inhaler Commonly known as: SYMBICORT INHALE 2 PUFFS INTO THE LUNGS IN THE MORNING AND AT BEDTIME. What changed: additional instructions   carvedilol 12.5 MG tablet Commonly known as: COREG Take 0.5 tablets (6.25 mg total) by mouth 2 (two) times daily. What changed: how much to take   clobetasol cream 0.05 % Commonly known as: TEMOVATE 1 application. daily as needed (eczema).   [employer] Melatonin 5 MG Tabs Generic drug: melatonin Take 5 mg by mouth at bedtime as needed (sleep).   fluticasone 50 MCG/ACT nasal spray Commonly known as: FLONASE Place 1 spray into both nostrils daily.   furosemide 40 MG tablet Commonly known as: LASIX Take 80 mg by mouth 2 (two) times daily.   losartan 25 MG tablet Commonly known as: COZAAR Take 25 mg by mouth daily.   OXYGEN Inhale 2-4 L into the lungs at bedtime.   predniSONE 20 MG tablet Commonly known as: DELTASONE Take 2 tablets (40 mg total) by mouth daily with breakfast for 7 days. Start taking on: June 01, 2022   Viagra 25 MG tablet Generic drug: sildenafil Take 25 mg by mouth daily as needed for erectile dysfunction.   Victoza 18 MG/3ML Sopn Generic drug: liraglutide Inject 3 mg into the skin at bedtime. To loose weight   Wellbutrin XL 300 MG 24 hr tablet Generic drug: buPROPion Take 300 mg by mouth daily.               Durable Medical Equipment  (From admission, onward)           Start     Ordered   05/28/22 1622  For home use only DME Other see comment  Once       Comments: NIV device capable of AVAPS  VT: 550cc, RR 8-15 BPM, PS Min 4, PS Max 15, EPAP Min 10, EPAP  Max 22, Max Pressure 32, AVAPS Rate 3-5  3L O2 bleed-in  Fit and modify mask as needed  Question:  Length of Need  Answer:  Lifetime   05/28/22 1630            No Known Allergies  Consultations: PCCM   Procedures/Studies: DG CHEST PORT 1 VIEW  Result Date: 05/27/2022 CLINICAL DATA:  Ventilator dependent respiratory failure. EXAM: PORTABLE CHEST 1 VIEW COMPARISON:  05/25/2022. FINDINGS: 4:39 a.m., 05/27/2022. The patient is rotated to the right. ETT tip is 5.5 cm from the carina. NGT enters the stomach with the intragastric course of the tube not filmed. The heart is enlarged. There is mild perihilar vascular congestion. No central edema is seen. The lungs expiratory today with increased opacity in the hypoinflated lung bases consistent with atelectasis or consolidation. There may be small pleural effusions. The upper lung fields are clear aside from right upper lobe perihilar linear atelectasis. The mediastinal configuration is stable. IMPRESSION: Low lung volumes with bibasilar increased opacity which could be increasing atelectasis or consolidation. Electronically Signed   By:  Almira Bar M.D.   On: 05/27/2022 07:25   DG CHEST PORT 1 VIEW  Result Date: 05/25/2022 CLINICAL DATA:  Intubated EXAM: PORTABLE CHEST 1 VIEW COMPARISON:  05/25/2022 FINDINGS: Single frontal view of the chest demonstrates endotracheal tube overlying tracheal air column tip at thoracic inlet. Enteric catheter passes below diaphragm tip excluded by collimation. Cardiac silhouette is unremarkable. Stable bilateral perihilar airspace disease, left greater than right. No effusion or pneumothorax. IMPRESSION: 1. Support devices as above. 2. Stable bilateral perihilar airspace disease, left greater than right, consistent with multifocal pneumonia or infection. Electronically Signed   By: Sharlet Salina M.D.   On: 05/25/2022 21:04   DG Chest Port 1 View  Result Date: 05/25/2022 CLINICAL DATA:  Status post  intubation. EXAM: PORTABLE CHEST 1 VIEW COMPARISON:  12/24/2021 FINDINGS: Normal sized heart. Endotracheal tube in satisfactory position. Nasogastric tube extending into the stomach. Mild patchy density at both lung bases, greater on the left. Linear density in the right mid to lower lung zone. No definite pleural fluid seen. IMPRESSION: 1. Satisfactory position of the endotracheal tube. 2. Bibasilar patchy atelectasis or pneumonia, greater on the left. 3. Mild right perihilar linear atelectasis. Electronically Signed   By: Beckie Salts M.D.   On: 05/25/2022 13:56   DG Abd Portable 1 View  Result Date: 05/25/2022 CLINICAL DATA:  Status post NG tube placement EXAM: PORTABLE ABDOMEN - 1 VIEW COMPARISON:  None Available. FINDINGS: Esophagogastric tube with tip and side port below the diaphragm. Paucity of included bowel gas. No obvious free air. IMPRESSION: Esophagogastric tube with tip and side port below the diaphragm. Paucity of included bowel gas. No obvious free air. Electronically Signed   By: Jearld Lesch M.D.   On: 05/25/2022 14:07      Subjective: Patient seen and examined at bedside.  Feels much better and wants to go home today.  Does not want to wait till Monday for decision from adapt.  No overnight fever, vomiting, chest pain reported. Discharge Exam: Vitals:   05/31/22 0253 05/31/22 0758  BP:    Pulse:    Resp: (!) 21   Temp:    SpO2:  95%    General: Pt is alert, awake, not in acute distress.  Currently on 4 L oxygen by nasal cannula Cardiovascular: rate controlled, S1/S2 + Respiratory: bilateral decreased breath sounds at bases with basilar crackles Abdominal: Soft, morbidly obese, NT, slightly distended, bowel sounds + Extremities: Bilateral lower extremity edema present; no cyanosis    The results of significant diagnostics from this hospitalization (including imaging, microbiology, ancillary and laboratory) are listed below for reference.     Microbiology: Recent  Results (from the past 240 hour(s))  Blood Culture (routine x 2)     Status: None   Collection Time: 05/25/22  2:00 PM   Specimen: BLOOD  Result Value Ref Range Status   Specimen Description   Final    BLOOD LEFT ANTECUBITAL Performed at [facility], [address], [place], [place] [address]    Special Requests   Final    BOTTLES DRAWN AEROBIC AND ANAEROBIC Blood Culture adequate volume Performed at [facility], [address] [place], [place] [address]    Culture   Final    NO GROWTH 5 DAYS Performed at [facility], [address] [place], [place] [address]    Report Status 05/30/2022 FINAL  Final  Blood Culture (routine x 2)     Status: None  Collection Time: 05/25/22  2:17 PM   Specimen: BLOOD  Result Value Ref Range Status   Specimen Description   Final    BLOOD RIGHT ANTECUBITAL Performed at [facility], [address], [place], [place] [address]    Special Requests   Final    BOTTLES DRAWN AEROBIC AND ANAEROBIC Blood Culture results may not be optimal due to an excessive volume of blood received in culture bottles Performed at [facility], [address], [place], [place] [address]    Culture   Final    NO GROWTH 5 DAYS Performed at [facility], [address], [place], [place] [address]    Report Status 05/30/2022 FINAL  Final  Resp Panel by RT-PCR (Flu A&B, Covid) Anterior Nasal Swab     Status: None   Collection Time: 05/25/22  2:18 PM   Specimen: Anterior Nasal Swab  Result Value Ref Range Status   SARS Coronavirus 2 by RT PCR NEGATIVE NEGATIVE Final    Comment: (NOTE) SARS-CoV-2 target nucleic acids are NOT DETECTED.  The SARS-CoV-2 RNA is generally detectable in upper respiratory specimens during the acute phase of infection. The lowest concentration of SARS-CoV-2 viral copies this assay can detect is 138 copies/mL. A negative result does not preclude  SARS-Cov-2 infection and should not be used as the sole basis for treatment or other patient management decisions. A negative result may occur with  improper specimen collection/handling, submission of specimen other than nasopharyngeal swab, presence of viral mutation(s) within the areas targeted by this assay, and inadequate number of viral copies(<138 copies/mL). A negative result must be combined with clinical observations, patient history, and epidemiological information. The expected result is Negative.  Fact Sheet for Patients:  BloggerCourse.com  Fact Sheet for Healthcare Providers:  SeriousBroker.it  This test is no t yet approved or cleared by the [place] FDA and  has been authorized for detection and/or diagnosis of SARS-CoV-2 by FDA under an Emergency Use Authorization (EUA). This EUA will remain  in effect (meaning this test can be used) for the duration of the COVID-19 declaration under Section 564(b)(1) of the Act, 21 U.S.C.section 360bbb-3(b)(1), unless the authorization is terminated  or revoked sooner.       Influenza A by PCR NEGATIVE NEGATIVE Final   Influenza B by PCR NEGATIVE NEGATIVE Final    Comment: (NOTE) The Xpert Xpress SARS-CoV-2/FLU/RSV plus assay is intended as an aid in the diagnosis of influenza from Nasopharyngeal swab specimens and should not be used as a sole basis for treatment. Nasal washings and aspirates are unacceptable for Xpert Xpress SARS-CoV-2/FLU/RSV testing.  Fact Sheet for Patients: BloggerCourse.com  Fact Sheet for Healthcare Providers: SeriousBroker.it  This test is not yet approved or cleared by the [place] FDA and has been authorized for detection and/or diagnosis of SARS-CoV-2 by FDA under an Emergency Use Authorization (EUA). This EUA will remain in effect (meaning this test can be used) for the duration of  the COVID-19 declaration under Section 564(b)(1) of the Act, 21 U.S.C. section 360bbb-3(b)(1), unless the authorization is terminated or revoked.  Performed at [facility], [address] [place], [place] [address]   MRSA Next Gen by PCR, Nasal     Status: None   Collection Time: 05/25/22  5:10 PM   Specimen: Nasal Mucosa; Nasal Swab  Result Value Ref Range Status   MRSA by PCR Next Gen NOT DETECTED NOT DETECTED Final    Comment: (NOTE) The  GeneXpert MRSA Assay (FDA approved for NASAL specimens only), is one component of a comprehensive MRSA colonization surveillance program. It is not intended to diagnose MRSA infection nor to guide or monitor treatment for MRSA infections. Test performance is not FDA approved in patients less than 75 years old. Performed at [facility], [address] [place], [place] [address]   Culture, Respiratory w Gram Stain     Status: None   Collection Time: 05/27/22 10:21 AM   Specimen: Tracheal Aspirate; Respiratory  Result Value Ref Range Status   Specimen Description   Final    TRACHEAL ASPIRATE Performed at [facility], [address], [place], [place] [address]    Special Requests   Final    NONE Performed at [facility], [address], [place], [place] [address]    Gram Stain   Final    NO WBC SEEN RARE BUDDING YEAST SEEN RARE GRAM POSITIVE COCCI IN PAIRS    Culture   Final    Normal respiratory flora-no Staph aureus or Pseudomonas seen Performed at [facility], [address], [place], [place] [address]    Report Status 05/29/2022 FINAL  Final     Labs: BNP (last 3 results) Recent Labs    12/20/21 1335 12/23/21 2315 05/25/22 1345  BNP 13.8 16.1 67.1   Basic Metabolic Panel: Recent Labs  Lab 05/26/22 2100 05/27/22 0631 05/27/22 1759 05/28/22 0959 05/28/22 1210 05/29/22 0031 05/29/22 0433 05/29/22 0811 05/30/22 0435 05/31/22 0525   NA  --  136  --  140  --   --  138  --  137 139  K  --  4.1  --  3.8   < > 4.1 4.1 3.8 4.0 3.5  CL  --  97*  --  99  --   --  98  --  97* 93*  CO2  --  31  --  33*  --   --  35*  --  34* 38*  GLUCOSE  --  291*  --  182*  --   --  183*  --  124* 78  BUN  --  31*  --  33*  --   --  23*  --  21* 21*  CREATININE  --  0.92  --  1.07  --   --  0.89  --  0.89 0.90  CALCIUM  --  8.6*  --  8.6*  --   --  8.2*  --  8.3* 8.6*  MG 2.0 2.2 2.5*  --   --   --   --   --  2.5* 2.5*  PHOS 3.7 3.7 5.2*  --   --   --   --   --   --   --    < > = values in this interval not displayed.   Liver Function Tests: Recent Labs  Lab 05/25/22 1345 05/28/22 0959 05/31/22 0525  AST 49* 31 35  ALT 49* 36 82*  ALKPHOS 81 55 55  BILITOT 1.4* 1.5* 1.5*  PROT 8.7* 6.8 6.5  ALBUMIN 4.3 3.2* 3.2*   No results for input(s): LIPASE, AMYLASE in the last 168 hours. No results for input(s): AMMONIA in the last 168 hours. CBC: Recent Labs  Lab 05/25/22 1334 05/26/22 0259 05/27/22 0631 05/30/22 0435  WBC 27.6* 24.8* 31.8* 24.0*  NEUTROABS 21.2*  --   --  18.2*  HGB 15.6 14.2 13.6 14.2  HCT  50.3 44.5 44.2 47.0  MCV 100.0 95.1 95.5 97.7  PLT 308 246 301 252   Cardiac Enzymes: No results for input(s): CKTOTAL, CKMB, CKMBINDEX, TROPONINI in the last 168 hours. BNP: Invalid input(s): POCBNP CBG: Recent Labs  Lab 05/30/22 1620 05/30/22 1937 05/31/22 0003 05/31/22 0458 05/31/22 0750  GLUCAP 241* 200* 153* 101* 151*   D-Dimer No results for input(s): DDIMER in the last 72 hours. Hgb A1c No results for input(s): HGBA1C in the last 72 hours. Lipid Profile Recent Labs    05/29/22 0433  TRIG 201*   Thyroid function studies No results for input(s): TSH, T4TOTAL, T3FREE, THYROIDAB in the last 72 hours.  Invalid input(s): FREET3 Anemia work up No results for input(s): VITAMINB12, FOLATE, FERRITIN, TIBC, IRON, RETICCTPCT in the last 72 hours. Urinalysis    Component Value Date/Time   COLORURINE YELLOW  05/25/2022 1412   APPEARANCEUR CLEAR 05/25/2022 1412   LABSPEC 1.026 05/25/2022 1412   PHURINE 5.0 05/25/2022 1412   GLUCOSEU 150 (A) 05/25/2022 1412   HGBUR NEGATIVE 05/25/2022 1412   BILIRUBINUR NEGATIVE 05/25/2022 1412   KETONESUR NEGATIVE 05/25/2022 1412   PROTEINUR 100 (A) 05/25/2022 1412   NITRITE NEGATIVE 05/25/2022 1412   LEUKOCYTESUR NEGATIVE 05/25/2022 1412   Sepsis Labs Invalid input(s): PROCALCITONIN,  WBC,  LACTICIDVEN Microbiology Recent Results (from the past 240 hour(s))  Blood Culture (routine x 2)     Status: None   Collection Time: 05/25/22  2:00 PM   Specimen: BLOOD  Result Value Ref Range Status   Specimen Description   Final    BLOOD LEFT ANTECUBITAL Performed at [facility], [address], [place], [place] [address]    Special Requests   Final    BOTTLES DRAWN AEROBIC AND ANAEROBIC Blood Culture adequate volume Performed at [facility], [address], [place], [place] [address]    Culture   Final    NO GROWTH 5 DAYS Performed at [facility], [address] [place], [place] [address]    Report Status 05/30/2022 FINAL  Final  Blood Culture (routine x 2)     Status: None   Collection Time: 05/25/22  2:17 PM   Specimen: BLOOD  Result Value Ref Range Status   Specimen Description   Final    BLOOD RIGHT ANTECUBITAL Performed at [facility], [address], [place], [place] [address]    Special Requests   Final    BOTTLES DRAWN AEROBIC AND ANAEROBIC Blood Culture results may not be optimal due to an excessive volume of blood received in culture bottles Performed at [facility], [address], [place], [place] [address]    Culture   Final    NO GROWTH 5 DAYS Performed at [facility], [address], [place], [place] [address]    Report Status 05/30/2022 FINAL  Final  Resp Panel by RT-PCR (Flu A&B, Covid) Anterior Nasal Swab     Status: None   Collection  Time: 05/25/22  2:18 PM   Specimen: Anterior Nasal Swab  Result Value Ref Range Status   SARS Coronavirus 2 by RT PCR NEGATIVE NEGATIVE Final    Comment: (NOTE) SARS-CoV-2 target nucleic acids are NOT DETECTED.  The SARS-CoV-2 RNA is generally detectable in upper respiratory specimens during the acute phase of infection. The lowest concentration of SARS-CoV-2 viral copies this assay can detect is 138 copies/mL. A negative result does not preclude SARS-Cov-2 infection and should not be used  as the sole basis for treatment or other patient management decisions. A negative result may occur with  improper specimen collection/handling, submission of specimen other than nasopharyngeal swab, presence of viral mutation(s) within the areas targeted by this assay, and inadequate number of viral copies(<138 copies/mL). A negative result must be combined with clinical observations, patient history, and epidemiological information. The expected result is Negative.  Fact Sheet for Patients:  BloggerCourse.com  Fact Sheet for Healthcare Providers:  SeriousBroker.it  This test is no t yet approved or cleared by the [place] FDA and  has been authorized for detection and/or diagnosis of SARS-CoV-2 by FDA under an Emergency Use Authorization (EUA). This EUA will remain  in effect (meaning this test can be used) for the duration of the COVID-19 declaration under Section 564(b)(1) of the Act, 21 U.S.C.section 360bbb-3(b)(1), unless the authorization is terminated  or revoked sooner.       Influenza A by PCR NEGATIVE NEGATIVE Final   Influenza B by PCR NEGATIVE NEGATIVE Final    Comment: (NOTE) The Xpert Xpress SARS-CoV-2/FLU/RSV plus assay is intended as an aid in the diagnosis of influenza from Nasopharyngeal swab specimens and should not be used as a sole basis for treatment. Nasal washings and aspirates are unacceptable for Xpert Xpress  SARS-CoV-2/FLU/RSV testing.  Fact Sheet for Patients: BloggerCourse.com  Fact Sheet for Healthcare Providers: SeriousBroker.it  This test is not yet approved or cleared by the [place] FDA and has been authorized for detection and/or diagnosis of SARS-CoV-2 by FDA under an Emergency Use Authorization (EUA). This EUA will remain in effect (meaning this test can be used) for the duration of the COVID-19 declaration under Section 564(b)(1) of the Act, 21 U.S.C. section 360bbb-3(b)(1), unless the authorization is terminated or revoked.  Performed at [facility], [address] [place], [place] [address]   MRSA Next Gen by PCR, Nasal     Status: None   Collection Time: 05/25/22  5:10 PM   Specimen: Nasal Mucosa; Nasal Swab  Result Value Ref Range Status   MRSA by PCR Next Gen NOT DETECTED NOT DETECTED Final    Comment: (NOTE) The GeneXpert MRSA Assay (FDA approved for NASAL specimens only), is one component of a comprehensive MRSA colonization surveillance program. It is not intended to diagnose MRSA infection nor to guide or monitor treatment for MRSA infections. Test performance is not FDA approved in patients less than 51 years old. Performed at [facility], [address], [place], [place] [address]   Culture, Respiratory w Gram Stain     Status: None   Collection Time: 05/27/22 10:21 AM   Specimen: Tracheal Aspirate; Respiratory  Result Value Ref Range Status   Specimen Description   Final    TRACHEAL ASPIRATE Performed at [facility], [address] [place], [place] [address]    Special Requests   Final    NONE Performed at [facility], [address] [place], [place] [address]    Gram Stain   Final    NO WBC SEEN RARE BUDDING YEAST SEEN RARE GRAM POSITIVE COCCI IN PAIRS    Culture   Final    Normal respiratory flora-no Staph  aureus or Pseudomonas seen Performed at [facility], [address], [place], [place] [address]    Report Status 05/29/2022 FINAL  Final     Time coordinating discharge: 35 minutes  SIGNED:   Glade Lloyd, MD  [facility] 05/31/2022, 10:23  AM

## 2022-05-31 NOTE — Progress Notes (Signed)
Discharge teaching complete. Meds, diet, activity, follow up appointments, CHF education reviewed and all questions answered. Copy of instructions given to patient and prescriptions sent to pharmacy. Patient given bipap mask and extra oxygen tubing per request. Per Dr. Hanley Ben, ok to go home with current cpap until [facility] is set up next week. Patient waiting on mother to come pick him up.

## 2022-05-31 NOTE — TOC Transition Note (Signed)
Transition of Care [facility]) - CM/SW Discharge Note   Patient Details  Name: Sherif Millspaugh MRN: [id number] Date of Birth: 03-26-1971  Transition of Care [facility]) CM/SW Contact:  Darleene Cleaver, LCSW Phone Number: 05/31/2022, 11:44 AM   Clinical Narrative:     [facility] worker yesterday arranged for patient to have a NIV delivered pending insurance authorization.  Per Ian Malkin at [facility], the NIV approval is still pending, patient is aware, and stated he would rather just go home today, use his BIPAP at home, and wait to hear back from [facility] on Monday.  Patient will be discharging back home today.  TOC signing off please reconsult if other social work needs arise.   Final next level of care: Home/Self Care Barriers to Discharge: Barriers Resolved   Patient Goals and CMS Choice Patient states their goals for this hospitalization and ongoing recovery are:: Patient plans to return back home. CMS Medicare.gov Compare Post Acute Care list provided to:: Patient Choice offered to / list presented to : Patient  Discharge Placement  Patient discharging back home.                     Discharge Plan and Services   Discharge Planning Services: CM Consult            DME Arranged: NIV DME Agency: [facility] Date DME Agency Contacted: 05/31/22 Time DME Agency Contacted: 1030 Representative spoke with at DME Agency: Ian Malkin at [employer]            Social Determinants of Health (SDOH) Interventions     Readmission Risk Interventions     View : No data to display.

## 2022-07-29 ENCOUNTER — Ambulatory Visit: Payer: Medicaid Other | Admitting: Pulmonary Disease

## 2022-11-16 IMAGING — DX DG CHEST 1V PORT
1 series · 1 of 1 positions shown · non-contrast
Comparison: 05/25/2022.

CLINICAL DATA: Ventilator dependent respiratory failure.

EXAM:
PORTABLE CHEST 1 VIEW

[chest ap]
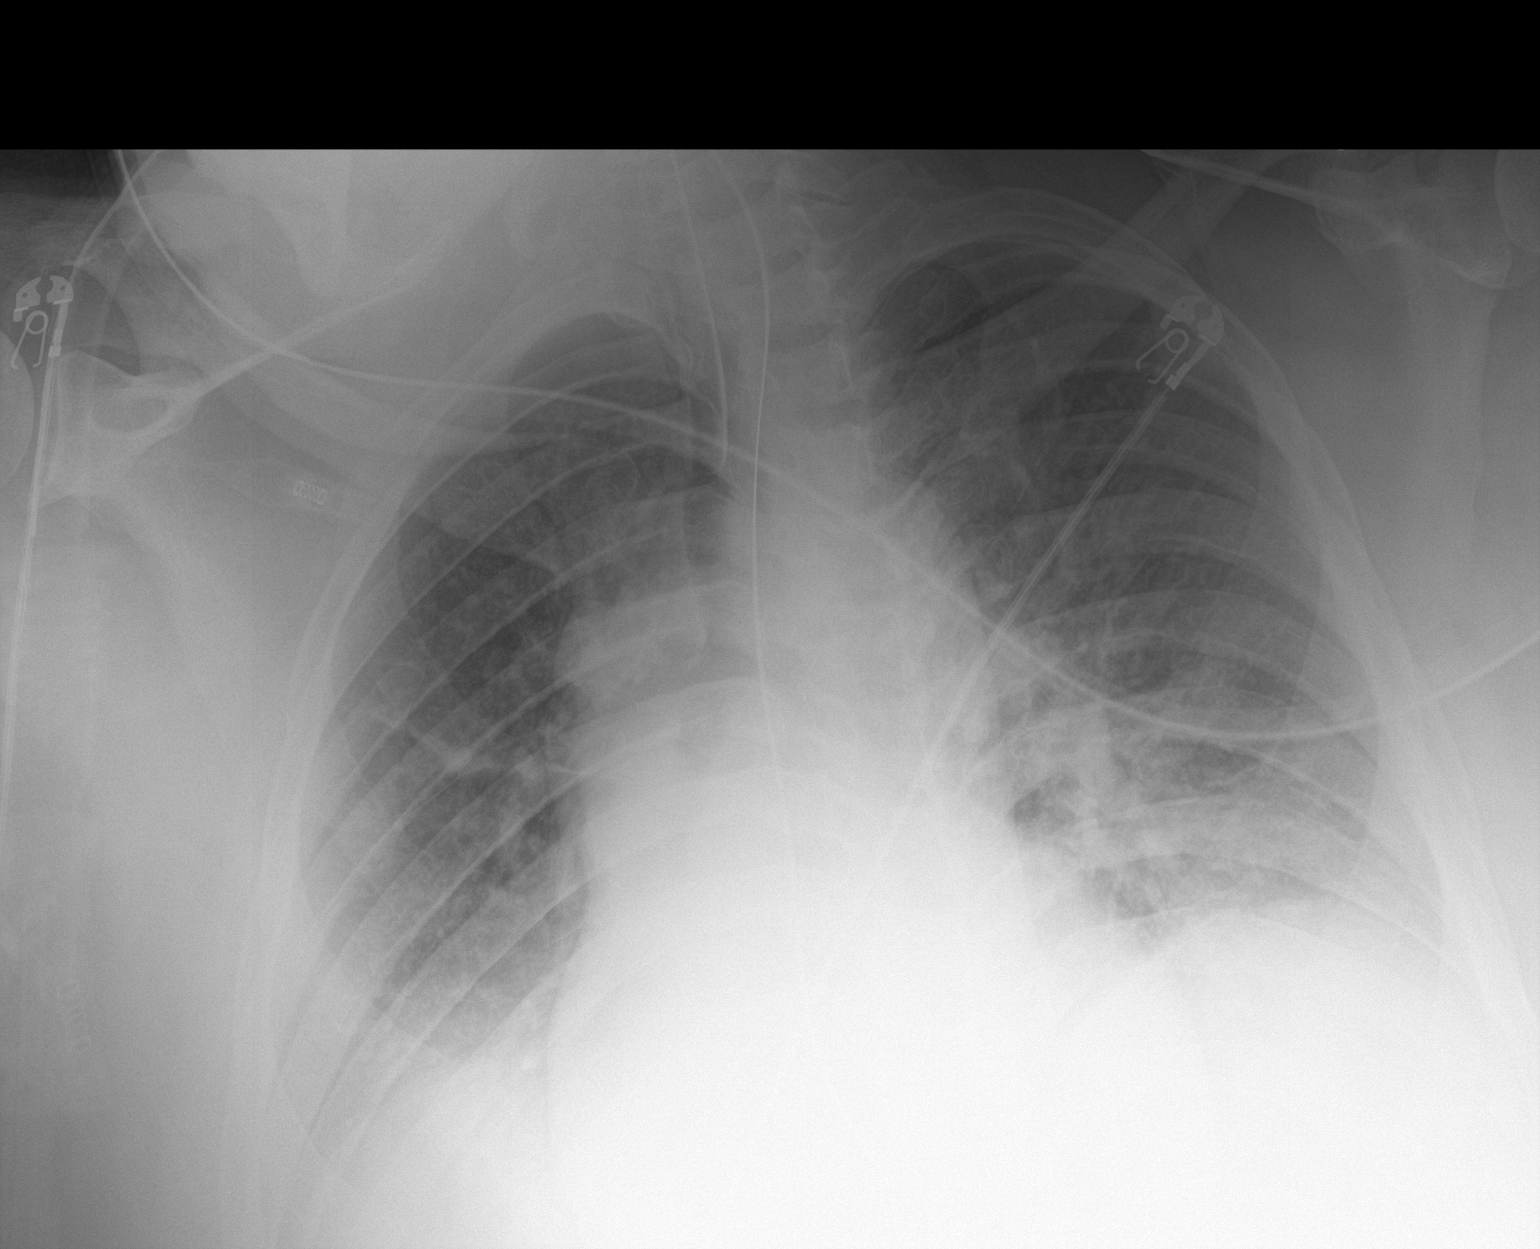

[1 of 1 positions shown; findings below may reference images not displayed]

FINDINGS: [DATE] a.m., 05/27/2022. The patient is rotated to the right. ETT tip
is 5.5 cm from the carina.

NGT enters the stomach with the intragastric course of the tube not
filmed.

The heart is enlarged. There is mild perihilar vascular congestion.
No central edema is seen.

The lungs expiratory today with increased opacity in the
hypoinflated lung bases consistent with atelectasis or
consolidation. There may be small pleural effusions.

The upper lung fields are clear aside from right upper lobe
perihilar linear atelectasis. The mediastinal configuration is
stable.
IMPRESSION: Low lung volumes with bibasilar increased opacity which could be
increasing atelectasis or consolidation.

## 2023-03-10 ENCOUNTER — Ambulatory Visit (HOSPITAL_BASED_OUTPATIENT_CLINIC_OR_DEPARTMENT_OTHER): Payer: Medicaid Other | Admitting: Pulmonary Disease

## 2023-03-31 ENCOUNTER — Encounter (HOSPITAL_BASED_OUTPATIENT_CLINIC_OR_DEPARTMENT_OTHER): Payer: Self-pay

## 2023-03-31 ENCOUNTER — Ambulatory Visit (HOSPITAL_BASED_OUTPATIENT_CLINIC_OR_DEPARTMENT_OTHER): Payer: Medicaid Other | Admitting: Pulmonary Disease

## 2023-11-05 ENCOUNTER — Other Ambulatory Visit: Payer: Self-pay

## 2023-11-09 ENCOUNTER — Encounter (HOSPITAL_COMMUNITY): Payer: Self-pay

## 2023-11-09 ENCOUNTER — Other Ambulatory Visit: Payer: Self-pay

## 2023-11-09 ENCOUNTER — Emergency Department (HOSPITAL_COMMUNITY): Payer: Medicaid Other

## 2023-11-09 ENCOUNTER — Emergency Department (HOSPITAL_COMMUNITY)
Admission: EM | Admit: 2023-11-09 | Discharge: 2023-11-09 | Discharge status: Against Medical Advice | Payer: Medicaid Other | Attending: Emergency Medicine | Admitting: Emergency Medicine

## 2023-11-09 DIAGNOSIS — R000 Tachycardia, unspecified: Secondary | ICD-10-CM | POA: Diagnosis not present

## 2023-11-09 DIAGNOSIS — I509 Heart failure, unspecified: Secondary | ICD-10-CM | POA: Insufficient documentation

## 2023-11-09 DIAGNOSIS — D72829 Elevated white blood cell count, unspecified: Secondary | ICD-10-CM | POA: Diagnosis not present

## 2023-11-09 DIAGNOSIS — R051 Acute cough: Secondary | ICD-10-CM

## 2023-11-09 DIAGNOSIS — Z7951 Long term (current) use of inhaled steroids: Secondary | ICD-10-CM | POA: Diagnosis not present

## 2023-11-09 DIAGNOSIS — R0602 Shortness of breath: Secondary | ICD-10-CM | POA: Diagnosis present

## 2023-11-09 DIAGNOSIS — Z1152 Encounter for screening for COVID-19: Secondary | ICD-10-CM | POA: Diagnosis not present

## 2023-11-09 DIAGNOSIS — J441 Chronic obstructive pulmonary disease with (acute) exacerbation: Secondary | ICD-10-CM | POA: Diagnosis not present

## 2023-11-09 LAB — CBC WITH DIFFERENTIAL/PLATELET
Abs Immature Granulocytes: 0.24 10*3/uL — ABNORMAL HIGH (ref 0.00–0.07)
Basophils Absolute: 0.1 10*3/uL (ref 0.0–0.1)
Basophils Relative: 0 %
Eosinophils Absolute: 0 10*3/uL (ref 0.0–0.5)
Eosinophils Relative: 0 %
HCT: 42 % (ref 39.0–52.0)
Hemoglobin: 13.2 g/dL (ref 13.0–17.0)
Immature Granulocytes: 1 %
Lymphocytes Relative: 11 %
Lymphs Abs: 2.7 10*3/uL (ref 0.7–4.0)
MCH: 29.2 pg (ref 26.0–34.0)
MCHC: 31.4 g/dL (ref 30.0–36.0)
MCV: 92.9 fL (ref 80.0–100.0)
Monocytes Absolute: 2.2 10*3/uL — ABNORMAL HIGH (ref 0.1–1.0)
Monocytes Relative: 9 %
Neutro Abs: 18.4 10*3/uL — ABNORMAL HIGH (ref 1.7–7.7)
Neutrophils Relative %: 79 %
Platelets: 247 10*3/uL (ref 150–400)
RBC: 4.52 MIL/uL (ref 4.22–5.81)
RDW: 13 % (ref 11.5–15.5)
WBC: 23.7 10*3/uL — ABNORMAL HIGH (ref 4.0–10.5)
nRBC: 0 % (ref 0.0–0.2)

## 2023-11-09 LAB — RESP PANEL BY RT-PCR (RSV, FLU A&B, COVID)  RVPGX2
Influenza A by PCR: NEGATIVE
Influenza B by PCR: NEGATIVE
Resp Syncytial Virus by PCR: NEGATIVE
SARS Coronavirus 2 by RT PCR: NEGATIVE

## 2023-11-09 LAB — BASIC METABOLIC PANEL
Anion gap: 7 (ref 5–15)
BUN: 12 mg/dL (ref 6–20)
CO2: 28 mmol/L (ref 22–32)
Calcium: 8.3 mg/dL — ABNORMAL LOW (ref 8.9–10.3)
Chloride: 97 mmol/L — ABNORMAL LOW (ref 98–111)
Creatinine, Ser: 1.11 mg/dL (ref 0.61–1.24)
GFR, Estimated: >60 mL/min (ref >60)
Glucose, Bld: 191 mg/dL — ABNORMAL HIGH (ref 70–99)
Potassium: 4 mmol/L (ref 3.5–5.1)
Sodium: 132 mmol/L — ABNORMAL LOW (ref 135–145)

## 2023-11-09 MED ORDER — IPRATROPIUM-ALBUTEROL 0.5-2.5 (3) MG/3ML IN SOLN
3.0000 mL | RESPIRATORY_TRACT | Status: AC
  Administered 2023-11-09 (×3): 3 mL via RESPIRATORY_TRACT
  Filled 2023-11-09: qty 3
  Filled 2023-11-09: qty 6

## 2023-11-09 MED ORDER — ACETAMINOPHEN 500 MG PO TABS
1000.0000 mg | ORAL_TABLET | Freq: Once | ORAL | Status: AC
  Administered 2023-11-09: 1000 mg via ORAL
  Filled 2023-11-09: qty 2

## 2023-11-09 MED ORDER — PREDNISONE 10 MG PO TABS: 40.0000 mg | ORAL_TABLET | Freq: Every day | ORAL | 0 refills | Status: AC

## 2023-11-09 MED ORDER — METHYLPREDNISOLONE SODIUM SUCC 125 MG IJ SOLR
125.0000 mg | Freq: Once | INTRAMUSCULAR | Status: AC
Start: 2023-11-09 — End: 2023-11-09
  Administered 2023-11-09: 125 mg via INTRAVENOUS
  Filled 2023-11-09: qty 2

## 2023-11-09 NOTE — ED Provider Notes (Signed)
EMERGENCY DEPARTMENT AT [facility] Provider Note   CSN: [id number] Arrival date & time: 11/09/23  2536     History  Chief Complaint  Patient presents with   Sore Throat   Fatigue    Joseph Morales is a 52 y.o. male.   Sore Throat Associated symptoms include shortness of breath.     52 year old male with medical history significant for CHF, asthma, obesity with obesity hypoventilation syndrome, COPD who presents to the emergency department with sore throat, fever, chills, generalized muscle aches and fatigue since this past Saturday.  He endorses worsening shortness of breath during that same amount of time.  He has been utilizing home inhalers but has not utilized a home nebulizer.  He also states that he has been having a bilateral rash on his hands for which she saw his internal medicine physician and was referred to dermatology outpatient.  He has been ongoing along his knuckles bilaterally and fingers and has been present for the last several months.  Was told he could not get a scheduled appointment until February or March.  Home Medications Prior to Admission medications   Medication Sig Start Date End Date Taking? Authorizing Provider  predniSONE (DELTASONE) 10 MG tablet Take 4 tablets (40 mg total) by mouth daily for 4 days. 11/09/23 11/13/23 Yes Ernie Avena, MD  albuterol (PROVENTIL) (2.5 MG/3ML) 0.083% nebulizer solution Use 1 vial (2.5 mg total) by nebulization every 2 (two) hours as needed for wheezing. 12/26/21   Joseph Art, DO  albuterol (VENTOLIN HFA) 108 (90 Base) MCG/ACT inhaler Inhale 2 puffs into the lungs every 6 (six) hours as needed for wheezing or shortness of breath.    [provider]  budesonide-formoterol (SYMBICORT) 160-4.5 MCG/ACT inhaler INHALE 2 PUFFS INTO THE LUNGS IN THE MORNING AND AT BEDTIME. Patient taking differently: Inhale 2 puffs into the lungs 2 (two) times daily. 06/18/21 06/18/22  Glenford Bayley, NP   carvedilol (COREG) 12.5 MG tablet Take 0.5 tablets (6.25 mg total) by mouth 2 (two) times daily. 05/31/22   Glade Lloyd, MD  clobetasol cream (TEMOVATE) 0.05 % 1 application. daily as needed (eczema). 05/15/22   [provider]  [employer] MELATONIN 5 MG TABS Take 5 mg by mouth at bedtime as needed (sleep). 05/15/22   [provider]  fluticasone (FLONASE) 50 MCG/ACT nasal spray Place 1 spray into both nostrils daily. 05/15/22   [provider]  furosemide (LASIX) 40 MG tablet Take 80 mg by mouth 2 (two) times daily. 08/05/21   [provider]  losartan (COZAAR) 25 MG tablet Take 25 mg by mouth daily. 12/16/21   [provider]  OXYGEN Inhale 2-4 L into the lungs at bedtime.    [provider]  VIAGRA 25 MG tablet Take 25 mg by mouth daily as needed for erectile dysfunction. 05/15/22   [provider]  VICTOZA 18 MG/3ML SOPN Inject 3 mg into the skin at bedtime. To loose weight 11/14/21   [provider]  WELLBUTRIN XL 300 MG 24 hr tablet Take 300 mg by mouth daily. 11/14/21   [provider]      Allergies    Patient has no known allergies.    Review of Systems   Review of Systems  Respiratory:  Positive for cough and shortness of breath.   All other systems reviewed and are negative.   Physical Exam Updated Vital Signs BP (!) 113/56   Pulse 81   Temp 99.5 F (  37.5 C) (Oral)   Resp 16   Ht 6\' 3"  (1.905 m)   Wt (!) 176.6 kg   SpO2 91%   BMI 48.66 kg/m  Physical Exam Vitals and nursing note reviewed.  Constitutional:      General: He is not in acute distress.    Appearance: He is well-developed. He is obese.  HENT:     Head: Normocephalic and atraumatic.  Eyes:     Conjunctiva/sclera: Conjunctivae normal.  Cardiovascular:     Rate and Rhythm: Regular rhythm. Tachycardia present.  Pulmonary:     Effort: Pulmonary effort is normal. No respiratory distress.     Breath sounds: Examination of the  right-upper field reveals wheezing. Examination of the left-upper field reveals wheezing. Examination of the right-middle field reveals wheezing. Examination of the left-middle field reveals wheezing. Examination of the right-lower field reveals wheezing. Examination of the left-lower field reveals wheezing. Wheezing present.  Abdominal:     Palpations: Abdomen is soft.     Tenderness: There is no abdominal tenderness.  Musculoskeletal:        General: No swelling.     Cervical back: Neck supple.  Skin:    General: Skin is warm and dry.     Capillary Refill: Capillary refill takes less than 2 seconds.     Findings: Rash present.     Comments: Scaling rash along the bilateral dorsal aspect of the hands along the MCP joints and digits with skin thickening and darkening  Neurological:     Mental Status: He is alert.  Psychiatric:        Mood and Affect: Mood normal.     ED Results / Procedures / Treatments   Labs (all labs ordered are listed, but only abnormal results are displayed) Labs Reviewed  CBC WITH DIFFERENTIAL/PLATELET - Abnormal; Notable for the following components:      Result Value   WBC 23.7 (*)    Neutro Abs 18.4 (*)    Monocytes Absolute 2.2 (*)    Abs Immature Granulocytes 0.24 (*)    All other components within normal limits  BASIC METABOLIC PANEL - Abnormal; Notable for the following components:   Sodium 132 (*)    Chloride 97 (*)    Glucose, Bld 191 (*)    Calcium 8.3 (*)    All other components within normal limits  RESP PANEL BY RT-PCR (RSV, FLU A&B, COVID)  [id number]    EKG None  Radiology DG Chest Portable 1 View  Result Date: 11/09/2023 CLINICAL DATA:  Asthma exacerbation EXAM: PORTABLE CHEST 1 VIEW COMPARISON:  Chest x-ray 05/27/2022 FINDINGS: The heart size and mediastinal contours are within normal limits. Both lungs are clear. The visualized skeletal structures are unremarkable. IMPRESSION: No active disease. Electronically Signed   By: Darliss Cheney M.D.   On: 11/09/2023 17:05    Procedures Procedures    Medications Ordered in ED Medications  acetaminophen (TYLENOL) tablet 1,000 mg (1,000 mg Oral Given 11/09/23 1000)  methylPREDNISolone sodium succinate (SOLU-MEDROL) 125 mg/2 mL injection 125 mg (125 mg Intravenous Given 11/09/23 1121)  ipratropium-albuterol (DUONEB) 0.5-2.5 (3) MG/3ML nebulizer solution 3 mL (3 mLs Nebulization Given 11/09/23 1120)    ED Course/ Medical Decision Making/ A&P                                 Medical Decision Making Amount and/or Complexity of Data Reviewed Labs: ordered. Radiology: ordered.  Risk  OTC drugs. Prescription drug management.    52 year old male with medical history significant for CHF, asthma, obesity with obesity hypoventilation syndrome, COPD who presents to the emergency department with sore throat, fever, chills, generalized muscle aches and fatigue since this past Saturday.  He endorses worsening shortness of breath during that same amount of time.  He has been utilizing home inhalers but has not utilized a home nebulizer.  He also states that he has been having a bilateral rash on his hands for which she saw his internal medicine physician and was referred to dermatology outpatient.  He has been ongoing along his knuckles bilaterally and fingers and has been present for the last several months.  Was told he could not get a scheduled appointment until February or March.  On arrival, the patient was afebrile, borderline febrile temperature 100.3 orally, tachycardic heart rate 112, not tachypneic RR 18, BP 133/85, saturating 92% on room air.  Patient with a history of asthma/COPD, presenting with likely viral tori infection, differential includes COVID-19, influenza, other viral infectious process, lower suspicion for bacterial pneumonia.  Suspect viral infection likely triggering asthma exacerbation.  The patient's hands appear to have a scaling rash with skin discoloration  consistent with potential psoriasis.  No evidence of cellulitis. Recommended hydrocortisone cream and outpatient follow-up with dermatology regarding this rash.   Patient pending chest x-ray read, discussed with radiology the delay.  His labs are significant for a leukocytosis to 23.7 which appears to be chronic, no anemia, hyperglycemia 191, COVID-19 and influenza PCR testing negative.  The patient was administered Solu-Medrol and DuoNebs.   Chest x-ray ultimately resulted without acute disease.  Suspect likely viral infection triggering COPD exacerbation. While awaiting chest x-ray read I was informed by nursing staff that the patient eloped from the emergency department prior to a repeat assessment.   Final Clinical Impression(s) / ED Diagnoses Final diagnoses:  Acute cough  COPD with acute exacerbation (HCC)    Rx / DC Orders ED Discharge Orders          Ordered    predniSONE (DELTASONE) 10 MG tablet  Daily        11/09/23 1836              Ernie Avena, MD 11/09/23 1836

## 2023-11-09 NOTE — ED Triage Notes (Signed)
Patient is here for evaluation of sore throat, fever, chills, fatigue since Saturday. Pt reports been taking Aleve and theraflu.

## 2023-11-17 ENCOUNTER — Other Ambulatory Visit: Payer: Self-pay

## 2023-11-18 ENCOUNTER — Other Ambulatory Visit: Payer: Self-pay

## 2024-02-02 ENCOUNTER — Encounter (HOSPITAL_COMMUNITY): Payer: Self-pay

## 2024-02-02 ENCOUNTER — Emergency Department (HOSPITAL_COMMUNITY): Payer: Medicaid Other

## 2024-02-02 ENCOUNTER — Emergency Department (HOSPITAL_COMMUNITY)
Admission: EM | Admit: 2024-02-02 | Discharge: 2024-02-02 | Discharge status: Against Medical Advice | Payer: Medicaid Other | Source: Home / Self Care | Attending: Emergency Medicine | Admitting: Emergency Medicine

## 2024-02-02 ENCOUNTER — Other Ambulatory Visit: Payer: Self-pay

## 2024-02-02 DIAGNOSIS — J441 Chronic obstructive pulmonary disease with (acute) exacerbation: Secondary | ICD-10-CM | POA: Insufficient documentation

## 2024-02-02 DIAGNOSIS — Z87891 Personal history of nicotine dependence: Secondary | ICD-10-CM | POA: Insufficient documentation

## 2024-02-02 DIAGNOSIS — Z20822 Contact with and (suspected) exposure to covid-19: Secondary | ICD-10-CM | POA: Insufficient documentation

## 2024-02-02 DIAGNOSIS — Z7951 Long term (current) use of inhaled steroids: Secondary | ICD-10-CM | POA: Insufficient documentation

## 2024-02-02 DIAGNOSIS — Z79899 Other long term (current) drug therapy: Secondary | ICD-10-CM | POA: Insufficient documentation

## 2024-02-02 DIAGNOSIS — D72829 Elevated white blood cell count, unspecified: Secondary | ICD-10-CM | POA: Insufficient documentation

## 2024-02-02 DIAGNOSIS — Z5329 Procedure and treatment not carried out because of patient's decision for other reasons: Secondary | ICD-10-CM | POA: Insufficient documentation

## 2024-02-02 DIAGNOSIS — J189 Pneumonia, unspecified organism: Secondary | ICD-10-CM

## 2024-02-02 DIAGNOSIS — I110 Hypertensive heart disease with heart failure: Secondary | ICD-10-CM | POA: Insufficient documentation

## 2024-02-02 DIAGNOSIS — I5033 Acute on chronic diastolic (congestive) heart failure: Secondary | ICD-10-CM | POA: Insufficient documentation

## 2024-02-02 LAB — BRAIN NATRIURETIC PEPTIDE: B Natriuretic Peptide: 95.8 pg/mL (ref 0.0–100.0)

## 2024-02-02 LAB — CBC WITH DIFFERENTIAL/PLATELET
Abs Immature Granulocytes: 0.34 10*3/uL — ABNORMAL HIGH (ref 0.00–0.07)
Basophils Absolute: 0.1 10*3/uL (ref 0.0–0.1)
Basophils Relative: 0 %
Eosinophils Absolute: 0 10*3/uL (ref 0.0–0.5)
Eosinophils Relative: 0 %
HCT: 46.5 % (ref 39.0–52.0)
Hemoglobin: 14.3 g/dL (ref 13.0–17.0)
Immature Granulocytes: 1 %
Lymphocytes Relative: 6 %
Lymphs Abs: 1.5 10*3/uL (ref 0.7–4.0)
MCH: 28.7 pg (ref 26.0–34.0)
MCHC: 30.8 g/dL (ref 30.0–36.0)
MCV: 93.2 fL (ref 80.0–100.0)
Monocytes Absolute: 1.8 10*3/uL — ABNORMAL HIGH (ref 0.1–1.0)
Monocytes Relative: 7 %
Neutro Abs: 24.4 10*3/uL — ABNORMAL HIGH (ref 1.7–7.7)
Neutrophils Relative %: 86 %
Platelets: 247 10*3/uL (ref 150–400)
RBC: 4.99 MIL/uL (ref 4.22–5.81)
RDW: 13 % (ref 11.5–15.5)
WBC: 28.2 10*3/uL — ABNORMAL HIGH (ref 4.0–10.5)
nRBC: 0 % (ref 0.0–0.2)

## 2024-02-02 LAB — COMPREHENSIVE METABOLIC PANEL
ALT: 22 U/L (ref 0–44)
AST: 24 U/L (ref 15–41)
Albumin: 4 g/dL (ref 3.5–5.0)
Alkaline Phosphatase: 68 U/L (ref 38–126)
Anion gap: 8 (ref 5–15)
BUN: 15 mg/dL (ref 6–20)
CO2: 31 mmol/L (ref 22–32)
Calcium: 8.8 mg/dL — ABNORMAL LOW (ref 8.9–10.3)
Chloride: 97 mmol/L — ABNORMAL LOW (ref 98–111)
Creatinine, Ser: 1.08 mg/dL (ref 0.61–1.24)
GFR, Estimated: >60 mL/min (ref >60)
Glucose, Bld: 180 mg/dL — ABNORMAL HIGH (ref 70–99)
Potassium: 4.1 mmol/L (ref 3.5–5.1)
Sodium: 136 mmol/L (ref 135–145)
Total Bilirubin: 1.7 mg/dL — ABNORMAL HIGH (ref 0.0–1.2)
Total Protein: 8.5 g/dL — ABNORMAL HIGH (ref 6.5–8.1)

## 2024-02-02 LAB — RESP PANEL BY RT-PCR (RSV, FLU A&B, COVID)  RVPGX2
Influenza A by PCR: NEGATIVE
Influenza B by PCR: NEGATIVE
Resp Syncytial Virus by PCR: NEGATIVE
SARS Coronavirus 2 by RT PCR: NEGATIVE

## 2024-02-02 LAB — MAGNESIUM: Magnesium: 1.6 mg/dL — ABNORMAL LOW (ref 1.7–2.4)

## 2024-02-02 MED ORDER — IPRATROPIUM-ALBUTEROL 0.5-2.5 (3) MG/3ML IN SOLN
RESPIRATORY_TRACT | Status: AC
  Administered 2024-02-02: 3 mL via RESPIRATORY_TRACT
  Filled 2024-02-02: qty 3

## 2024-02-02 MED ORDER — MAGNESIUM SULFATE 2 GM/50ML IV SOLN
2.0000 g | Freq: Once | INTRAVENOUS | Status: AC
  Administered 2024-02-02: 2 g via INTRAVENOUS
  Filled 2024-02-02: qty 50

## 2024-02-02 MED ORDER — ACETAMINOPHEN 500 MG PO TABS
1000.0000 mg | ORAL_TABLET | Freq: Once | ORAL | Status: AC
  Administered 2024-02-02: 1000 mg via ORAL
  Filled 2024-02-02: qty 2

## 2024-02-02 MED ORDER — SODIUM CHLORIDE 0.9 % IV BOLUS
500.0000 mL | Freq: Once | INTRAVENOUS | Status: AC
Start: 2024-02-02 — End: 2024-02-02
  Administered 2024-02-02: 500 mL via INTRAVENOUS

## 2024-02-02 MED ORDER — IPRATROPIUM-ALBUTEROL 0.5-2.5 (3) MG/3ML IN SOLN: 3.0000 mL | Freq: Once | RESPIRATORY_TRACT | Status: AC

## 2024-02-02 MED ORDER — ALBUTEROL SULFATE (2.5 MG/3ML) 0.083% IN NEBU
10.0000 mg/h | INHALATION_SOLUTION | Freq: Once | RESPIRATORY_TRACT | Status: AC
Start: 2024-02-02 — End: 2024-02-02
  Administered 2024-02-02: 10 mg/h via RESPIRATORY_TRACT
  Filled 2024-02-02: qty 12

## 2024-02-02 MED ORDER — METHYLPREDNISOLONE SODIUM SUCC 125 MG IJ SOLR
125.0000 mg | Freq: Once | INTRAMUSCULAR | Status: AC
  Administered 2024-02-02: 125 mg via INTRAVENOUS
  Filled 2024-02-02: qty 2

## 2024-02-02 MED ORDER — IBUPROFEN 800 MG PO TABS
800.0000 mg | ORAL_TABLET | Freq: Once | ORAL | Status: AC
  Administered 2024-02-02: 800 mg via ORAL
  Filled 2024-02-02: qty 1

## 2024-02-02 MED ORDER — IOHEXOL 350 MG/ML SOLN
75.0000 mL | Freq: Once | INTRAVENOUS | Status: AC | PRN
  Administered 2024-02-02: 75 mL via INTRAVENOUS

## 2024-02-02 NOTE — ED Provider Notes (Signed)
error   Lonell Grandchild, MD 02/02/24 [phone]

## 2024-02-02 NOTE — ED Provider Triage Note (Signed)
 Emergency Medicine Provider Triage Evaluation Note  Joseph Morales , a 53 y.o. male  was evaluated in triage.  Pt complains of shortness of breath.  Patient reports fever and shortness of breath for the past 4 days.  Shortness breath is worsened.  Endorses smoking history, history of asthma.  He has used albuterol  inhaler without any improvement.  Review of Systems  Positive: Shortness of breath, fever Negative: CP  Physical Exam  BP (!) 157/87   Pulse (!) 128   Temp (!) 101 F (38.3 C) (Oral)   Resp (!) 37   SpO2 91%  Gen:   Awake, no distress   Resp:  Wheezing throughout MSK:   Moves extremities without difficulty  Other:    Medical Decision Making  Medically screening exam initiated at 12:35 PM.  Appropriate orders placed.  Joseph Morales was informed that the remainder of the evaluation will be completed by another provider, this initial triage assessment does not replace that evaluation, and the importance of remaining in the ED until their evaluation is complete.  DuoNeb given and triage.  Oxygen saturation increased to 97%.   Joseph Sluder, PA-C 02/02/24 1311

## 2024-02-02 NOTE — ED Provider Notes (Signed)
 [place] EMERGENCY DEPARTMENT AT [facility] Provider Note  CSN: [id number] Arrival date & time: 02/02/24 1216  Chief Complaint(s) Shortness of Breath  HPI Joseph Morales is a 53 y.o. male history of CHF, COPD, obesity hypoventilation syndrome presenting to the emergency department with shortness of breath.  Patient reports increasing shortness of breath over the past 4 days associated with fever, cough.  Reports productive cough.  Tried using albuterol  inhaler without significant improvement at home.  Reports some mild chest pain with cough.  No back pain.  No abdominal pain.  Reports he did have a little bit of a sore throat and some congestion earlier which has improved.  His sister was sick with similar symptoms.  No leg swelling   Past Medical History Past Medical History:  Diagnosis Date   Asthma    CHF (congestive heart failure) (HCC)    Chronic diastolic heart failure (HCC)    COPD (chronic obstructive pulmonary disease) (HCC)    Hypertension    Hypoventilation associated with obesity syndrome (HCC)    Obesity    Patient Active Problem List   Diagnosis Date Noted   Acute on chronic respiratory failure with hypoxia and hypercapnia (HCC) 05/30/2022   Transaminitis 05/30/2022   CAP (community acquired pneumonia) 05/30/2022   COPD with acute exacerbation (HCC) 05/30/2022   Acute respiratory failure with hypoxia and hypercarbia (HCC) 12/24/2021   Influenza A 12/24/2021   Acute respiratory failure with hypoxia and hypercapnia (HCC) 12/24/2021   Hypercapnic respiratory failure (HCC) 05/18/2021   Erectile dysfunction 02/22/2021   Tobacco abuse 10/24/2020   Chronic respiratory failure with hypoxia and hypercapnia (HCC) 10/16/2020   Essential hypertension 10/16/2020   Hyperlipidemia 10/16/2020   Asthma 06/21/2020   Acute on chronic diastolic CHF (congestive heart failure) (HCC) 06/21/2020   Orthopnea 06/21/2020   Chronic venous insufficiency 06/21/2020   Cellulitis  06/21/2020   Obesity, Class III, BMI 40-49.9 (morbid obesity) (HCC) 06/21/2020   Obesity hypoventilation syndrome (HCC) 06/21/2020   Leucocytosis 06/21/2020   Chronic respiratory alkalosis 06/21/2020   Hypertensive urgency 06/21/2020   Calf tenderness 06/21/2020   Home Medication(s) Prior to Admission medications   Medication Sig Start Date End Date Taking? Authorizing Provider  albuterol  (PROVENTIL ) (2.5 MG/3ML) 0.083% nebulizer solution Use 1 vial (2.5 mg total) by nebulization every 2 (two) hours as needed for wheezing. 12/26/21   Vann, Jessica U, DO  albuterol  (VENTOLIN  HFA) 108 (90 Base) MCG/ACT inhaler Inhale 2 puffs into the lungs every 6 (six) hours as needed for wheezing or shortness of breath.    [provider]  budesonide -formoterol  (SYMBICORT ) 160-4.5 MCG/ACT inhaler INHALE 2 PUFFS INTO THE LUNGS IN THE MORNING AND AT BEDTIME. Patient taking differently: Inhale 2 puffs into the lungs 2 (two) times daily. 06/18/21 06/18/22  Hope Almarie ORN, NP  carvedilol  (COREG ) 12.5 MG tablet Take 0.5 tablets (6.25 mg total) by mouth 2 (two) times daily. 05/31/22   Cheryle Page, MD  clobetasol cream (TEMOVATE) 0.05 % 1 application. daily as needed (eczema). 05/15/22   [provider]  [facility] MELATONIN 5 MG TABS Take 5 mg by mouth at bedtime as needed (sleep). 05/15/22   [provider]  fluticasone  (FLONASE ) 50 MCG/ACT nasal spray Place 1 spray into both nostrils daily. 05/15/22   [provider]  furosemide  (LASIX ) 40 MG tablet Take 80 mg by mouth 2 (two) times daily. 08/05/21   [provider]  losartan  (COZAAR ) 25 MG tablet Take 25 mg by mouth daily. 12/16/21  [provider]  OXYGEN Inhale 2-4 L into the lungs at bedtime.    [provider]  VIAGRA 25 MG tablet Take 25 mg by mouth daily as needed for erectile dysfunction. 05/15/22   [provider]  VICTOZA 18 MG/3ML SOPN Inject 3 mg into the skin at bedtime. To loose weight  11/14/21   [provider]  WELLBUTRIN  XL 300 MG 24 hr tablet Take 300 mg by mouth daily. 11/14/21   [provider]                                                                                                                                    Past Surgical History Past Surgical History:  Procedure Laterality Date   NO PAST SURGERIES     Family History Family History  Problem Relation Age of Onset   Breast cancer Mother    Diabetes Mother    Emphysema Maternal Grandmother    Congestive Heart Failure Maternal Grandmother     Social History Social History   Tobacco Use   Smoking status: Former    Current packs/day: 1.00    Average packs/day: 1 pack/day for 20.0 years (20.0 ttl pk-yrs)    Types: Cigarettes   Smokeless tobacco: Never  Vaping Use   Vaping status: Never Used  Substance Use Topics   Alcohol use: Never   Drug use: Yes    Types: Marijuana    Comment: occasional marijuana   Allergies Patient has no known allergies.  Review of Systems Review of Systems  All other systems reviewed and are negative.   Physical Exam Vital Signs  I have reviewed the triage vital signs BP (!) 131/49   Pulse (!) 125   Temp (!) 101 F (38.3 C) (Oral)   Resp (!) 34   Ht 6' 3 (1.905 m)   Wt (!) 176.6 kg   SpO2 96%   BMI 48.66 kg/m  Physical Exam Vitals and nursing note reviewed.  Constitutional:      General: He is not in acute distress.    Appearance: Normal appearance. He is obese.  HENT:     Mouth/Throat:     Mouth: Mucous membranes are moist.  Eyes:     Conjunctiva/sclera: Conjunctivae normal.  Cardiovascular:     Rate and Rhythm: Regular rhythm. Tachycardia present.  Pulmonary:     Breath sounds: No rhonchi or rales.     Comments: Increased work of breathing, tachypnea, frequent cough, diffuse wheezing Abdominal:     General: Abdomen is flat.     Palpations: Abdomen is soft.     Tenderness: There is no abdominal tenderness.   Musculoskeletal:     Right lower leg: No edema.     Left lower leg: No edema.  Skin:    General: Skin is warm and dry.     Capillary Refill: Capillary refill takes less than 2 seconds.  Neurological:  Mental Status: He is alert and oriented to person, place, and time. Mental status is at baseline.  Psychiatric:        Mood and Affect: Mood normal.        Behavior: Behavior normal.     ED Results and Treatments Labs (all labs ordered are listed, but only abnormal results are displayed) Labs Reviewed  COMPREHENSIVE METABOLIC PANEL - Abnormal; Notable for the following components:      Result Value   Chloride 97 (*)    Glucose, Bld 180 (*)    Calcium  8.8 (*)    Total Protein 8.5 (*)    Total Bilirubin 1.7 (*)    All other components within normal limits  CBC WITH DIFFERENTIAL/PLATELET - Abnormal; Notable for the following components:   WBC 28.2 (*)    Neutro Abs 24.4 (*)    Monocytes Absolute 1.8 (*)    Abs Immature Granulocytes 0.34 (*)    All other components within normal limits  MAGNESIUM  - Abnormal; Notable for the following components:   Magnesium  1.6 (*)    All other components within normal limits  RESP PANEL BY RT-PCR (RSV, FLU A&B, COVID)  RVPGX2  BRAIN NATRIURETIC PEPTIDE  BLOOD GAS, VENOUS                                                                                                                          Radiology DG Chest Portable 1 View Result Date: 02/02/2024 CLINICAL DATA:  Shortness of breath and fever. EXAM: PORTABLE CHEST 1 VIEW COMPARISON:  Chest x-ray dated November 09, 2023. FINDINGS: The patient is rotated to the right. The heart size and mediastinal contours are within normal limits. Normal pulmonary vascularity. Hazy density at the right lung base is felt to be overlapping soft tissue. No focal consolidation, pleural effusion, or pneumothorax. No acute osseous abnormality. IMPRESSION: 1. No active disease. Electronically Signed   By: Elsie ONEIDA Shoulder M.D.   On: 02/02/2024 14:11    Pertinent labs & imaging results that were available during my care of the patient were reviewed by me and considered in my medical decision making (see MDM for details).  Medications Ordered in ED Medications  magnesium  sulfate IVPB 2 g 50 mL (2 g Intravenous New Bag/Given 02/02/24 1506)  iohexol  (OMNIPAQUE ) 350 MG/ML injection 75 mL (has no administration in time range)  ipratropium-albuterol  (DUONEB) 0.5-2.5 (3) MG/3ML nebulizer solution 3 mL (3 mLs Nebulization Given 02/02/24 1247)  ibuprofen  (ADVIL ) tablet 800 mg (800 mg Oral Given 02/02/24 1325)  methylPREDNISolone  sodium succinate (SOLU-MEDROL ) 125 mg/2 mL injection 125 mg (125 mg Intravenous Given 02/02/24 1327)  sodium chloride  0.9 % bolus 500 mL (500 mLs Intravenous New Bag/Given 02/02/24 1328)  acetaminophen  (TYLENOL ) tablet 1,000 mg (1,000 mg Oral Given 02/02/24 1326)  albuterol  (PROVENTIL ) (2.5 MG/3ML) 0.083% nebulizer solution (10 mg/hr Nebulization Given 02/02/24 1345)  Procedures Procedures  (including critical care time)  Medical Decision Making / ED Course   MDM:  53 year old presenting to the emergency department shortness of breath.  Patient overall in mild respiratory distress, with increased work of breathing.  Exam with diffuse wheezing.  Suspect likely COPD/asthma exacerbation.  Does feel better after receiving additional DuoNeb and continuous albuterol  treatment, steroids, magnesium .  Patient remains with increased work of breathing however and remains tachycardic.  Also with fever.  He reports productive cough, chest x-ray is clear.  Given persistent tachycardia, dyspnea, fever with negative chest x-ray, will obtain CTA chest to evaluate for underlying PE or pneumonia.  Flu/COVID testing is negative.  Could also represent alternative URI.  Patient does have  history of CHF, however clinically he does not appear to be in acute CHF exacerbation with minimal lower extremity swelling, no crackles.  Chest x-ray with no evidence of pneumothorax.  Signed out to oncoming physician Dr. Suzette pending CTA chest and reassessment.      Additional history obtained: -Additional history obtained from family -External records from outside source obtained and reviewed including: Chart review including previous notes, labs, imaging, consultation notes including prior visits    Lab Tests: -I ordered, reviewed, and interpreted labs.   The pertinent results include:   Labs Reviewed  COMPREHENSIVE METABOLIC PANEL - Abnormal; Notable for the following components:      Result Value   Chloride 97 (*)    Glucose, Bld 180 (*)    Calcium  8.8 (*)    Total Protein 8.5 (*)    Total Bilirubin 1.7 (*)    All other components within normal limits  CBC WITH DIFFERENTIAL/PLATELET - Abnormal; Notable for the following components:   WBC 28.2 (*)    Neutro Abs 24.4 (*)    Monocytes Absolute 1.8 (*)    Abs Immature Granulocytes 0.34 (*)    All other components within normal limits  MAGNESIUM  - Abnormal; Notable for the following components:   Magnesium  1.6 (*)    All other components within normal limits  RESP PANEL BY RT-PCR (RSV, FLU A&B, COVID)  RVPGX2  BRAIN NATRIURETIC PEPTIDE  BLOOD GAS, VENOUS    Notable for leukocytosis, appears chronic  EKG   EKG Interpretation Date/Time:  Tuesday February 02 2024 13:14:53 EST Ventricular Rate:  125 PR Interval:  126 QRS Duration:  94 QT Interval:  309 QTC Calculation: 446 R Axis:   52  Text Interpretation: Sinus tachycardia Consider right atrial enlargement Nonspecific repol abnormality, diffuse leads Confirmed by Francesca Fallow ([id number]) on 02/02/2024 1:18:07 PM         Imaging Studies ordered: I ordered imaging studies including CXR On my interpretation imaging demonstrates no acute process I independently  visualized and interpreted imaging. I agree with the radiologist interpretation   Medicines ordered and prescription drug management: Meds ordered this encounter  Medications   ipratropium-albuterol  (DUONEB) 0.5-2.5 (3) MG/3ML nebulizer solution 3 mL   ibuprofen  (ADVIL ) tablet 800 mg   ipratropium-albuterol  (DUONEB) 0.5-2.5 (3) MG/3ML nebulizer solution    Hollatz, Cassandra J: cabinet override   methylPREDNISolone  sodium succinate (SOLU-MEDROL ) 125 mg/2 mL injection 125 mg    IV methylprednisolone  will be converted to either a q12h or q24h frequency with the same total daily dose (TDD).  Ordered Dose: 1 to 125 mg TDD; convert to: TDD q24h.  Ordered Dose: 126 to 250 mg TDD; convert to: TDD div q12h.  Ordered Dose: >250 mg TDD; DAW.   sodium chloride  0.9 % bolus  500 mL   acetaminophen  (TYLENOL ) tablet 1,000 mg   albuterol  (PROVENTIL ) (2.5 MG/3ML) 0.083% nebulizer solution   magnesium  sulfate IVPB 2 g 50 mL   iohexol  (OMNIPAQUE ) 350 MG/ML injection 75 mL    -I have reviewed the patients home medicines and have made adjustments as needed     Cardiac Monitoring: The patient was maintained on a cardiac monitor.  I personally viewed and interpreted the cardiac monitored which showed an underlying rhythm of: sinus tachycardia   Social Determinants of Health:  Diagnosis or treatment significantly limited by social determinants of health: current smoker and obesity   Reevaluation: After the interventions noted above, I reevaluated the patient and found that their symptoms have improved  Co morbidities that complicate the patient evaluation  Past Medical History:  Diagnosis Date   Asthma    CHF (congestive heart failure) (HCC)    Chronic diastolic heart failure (HCC)    COPD (chronic obstructive pulmonary disease) (HCC)    Hypertension    Hypoventilation associated with obesity syndrome (HCC)    Obesity       Dispostion: Disposition decision including need for  hospitalization was considered, and patient discharged from emergency department.    Final Clinical Impression(s) / ED Diagnoses Final diagnoses:  COPD exacerbation (HCC)     This chart was dictated using voice recognition software.  Despite best efforts to proofread,  errors can occur which can change the documentation meaning.    Francesca Elsie CROME, MD 02/02/24 [phone]

## 2024-02-02 NOTE — ED Triage Notes (Signed)
Patient has labored breathing starting worsening today. Given neb.

## 2024-02-02 NOTE — ED Provider Notes (Signed)
Patient left a message before I was able to discuss the results of his CT scan of his chest.  He does not have a PE but he has multi lobar pneumonia   Bethann Berkshire, MD 02/02/24 1805

## 2024-02-03 ENCOUNTER — Inpatient Hospital Stay (HOSPITAL_COMMUNITY)
Admission: EM | Admit: 2024-02-03 | Discharge: 2024-02-08 | DRG: 871 | Disposition: A | Discharge status: Home / Self Care | Payer: Medicaid Other | Attending: Obstetrics and Gynecology | Admitting: Obstetrics and Gynecology

## 2024-02-03 ENCOUNTER — Encounter (HOSPITAL_COMMUNITY): Payer: Self-pay

## 2024-02-03 ENCOUNTER — Other Ambulatory Visit: Payer: Self-pay

## 2024-02-03 ENCOUNTER — Emergency Department (HOSPITAL_COMMUNITY): Payer: Medicaid Other

## 2024-02-03 DIAGNOSIS — E1165 Type 2 diabetes mellitus with hyperglycemia: Secondary | ICD-10-CM | POA: Diagnosis present

## 2024-02-03 DIAGNOSIS — E662 Morbid (severe) obesity with alveolar hypoventilation: Secondary | ICD-10-CM | POA: Diagnosis present

## 2024-02-03 DIAGNOSIS — J9611 Chronic respiratory failure with hypoxia: Secondary | ICD-10-CM

## 2024-02-03 DIAGNOSIS — J9622 Acute and chronic respiratory failure with hypercapnia: Secondary | ICD-10-CM | POA: Diagnosis present

## 2024-02-03 DIAGNOSIS — J189 Pneumonia, unspecified organism: Secondary | ICD-10-CM

## 2024-02-03 DIAGNOSIS — Z7951 Long term (current) use of inhaled steroids: Secondary | ICD-10-CM

## 2024-02-03 DIAGNOSIS — F32A Depression, unspecified: Secondary | ICD-10-CM | POA: Diagnosis present

## 2024-02-03 DIAGNOSIS — R6520 Severe sepsis without septic shock: Principal | ICD-10-CM | POA: Diagnosis present

## 2024-02-03 DIAGNOSIS — J440 Chronic obstructive pulmonary disease with acute lower respiratory infection: Secondary | ICD-10-CM | POA: Diagnosis present

## 2024-02-03 DIAGNOSIS — F17210 Nicotine dependence, cigarettes, uncomplicated: Secondary | ICD-10-CM | POA: Diagnosis present

## 2024-02-03 DIAGNOSIS — J9621 Acute and chronic respiratory failure with hypoxia: Secondary | ICD-10-CM | POA: Diagnosis present

## 2024-02-03 DIAGNOSIS — J45909 Unspecified asthma, uncomplicated: Secondary | ICD-10-CM | POA: Diagnosis present

## 2024-02-03 DIAGNOSIS — Z803 Family history of malignant neoplasm of breast: Secondary | ICD-10-CM

## 2024-02-03 DIAGNOSIS — E860 Dehydration: Secondary | ICD-10-CM | POA: Diagnosis present

## 2024-02-03 DIAGNOSIS — A419 Sepsis, unspecified organism: Secondary | ICD-10-CM | POA: Diagnosis present

## 2024-02-03 DIAGNOSIS — I110 Hypertensive heart disease with heart failure: Secondary | ICD-10-CM | POA: Diagnosis present

## 2024-02-03 DIAGNOSIS — Z6841 Body Mass Index (BMI) 40.0 and over, adult: Secondary | ICD-10-CM | POA: Diagnosis not present

## 2024-02-03 DIAGNOSIS — I872 Venous insufficiency (chronic) (peripheral): Secondary | ICD-10-CM | POA: Diagnosis present

## 2024-02-03 DIAGNOSIS — F419 Anxiety disorder, unspecified: Secondary | ICD-10-CM | POA: Diagnosis present

## 2024-02-03 DIAGNOSIS — Z9981 Dependence on supplemental oxygen: Secondary | ICD-10-CM

## 2024-02-03 DIAGNOSIS — J441 Chronic obstructive pulmonary disease with (acute) exacerbation: Secondary | ICD-10-CM | POA: Diagnosis present

## 2024-02-03 DIAGNOSIS — I5033 Acute on chronic diastolic (congestive) heart failure: Secondary | ICD-10-CM | POA: Diagnosis present

## 2024-02-03 DIAGNOSIS — E66813 Obesity, class 3: Secondary | ICD-10-CM | POA: Diagnosis present

## 2024-02-03 DIAGNOSIS — E871 Hypo-osmolality and hyponatremia: Secondary | ICD-10-CM | POA: Diagnosis present

## 2024-02-03 DIAGNOSIS — I10 Essential (primary) hypertension: Secondary | ICD-10-CM

## 2024-02-03 DIAGNOSIS — Z8249 Family history of ischemic heart disease and other diseases of the circulatory system: Secondary | ICD-10-CM

## 2024-02-03 DIAGNOSIS — J9612 Chronic respiratory failure with hypercapnia: Secondary | ICD-10-CM

## 2024-02-03 DIAGNOSIS — Z23 Encounter for immunization: Secondary | ICD-10-CM

## 2024-02-03 DIAGNOSIS — Z825 Family history of asthma and other chronic lower respiratory diseases: Secondary | ICD-10-CM

## 2024-02-03 DIAGNOSIS — Z79899 Other long term (current) drug therapy: Secondary | ICD-10-CM

## 2024-02-03 DIAGNOSIS — Z1152 Encounter for screening for COVID-19: Secondary | ICD-10-CM

## 2024-02-03 DIAGNOSIS — Z833 Family history of diabetes mellitus: Secondary | ICD-10-CM

## 2024-02-03 DIAGNOSIS — E785 Hyperlipidemia, unspecified: Secondary | ICD-10-CM | POA: Diagnosis present

## 2024-02-03 DIAGNOSIS — Z7985 Long-term (current) use of injectable non-insulin antidiabetic drugs: Secondary | ICD-10-CM

## 2024-02-03 LAB — CBC WITH DIFFERENTIAL/PLATELET
Abs Immature Granulocytes: 2.54 10*3/uL — ABNORMAL HIGH (ref 0.00–0.07)
Basophils Absolute: 0.1 10*3/uL (ref 0.0–0.1)
Basophils Relative: 0 %
Eosinophils Absolute: 0 10*3/uL (ref 0.0–0.5)
Eosinophils Relative: 0 %
HCT: 44.6 % (ref 39.0–52.0)
Hemoglobin: 13.9 g/dL (ref 13.0–17.0)
Immature Granulocytes: 6 %
Lymphocytes Relative: 4 %
Lymphs Abs: 1.8 10*3/uL (ref 0.7–4.0)
MCH: 29 pg (ref 26.0–34.0)
MCHC: 31.2 g/dL (ref 30.0–36.0)
MCV: 92.9 fL (ref 80.0–100.0)
Monocytes Absolute: 4 10*3/uL — ABNORMAL HIGH (ref 0.1–1.0)
Monocytes Relative: 9 %
Neutro Abs: 37.1 10*3/uL — ABNORMAL HIGH (ref 1.7–7.7)
Neutrophils Relative %: 81 %
Platelets: 244 10*3/uL (ref 150–400)
RBC: 4.8 MIL/uL (ref 4.22–5.81)
RDW: 13 % (ref 11.5–15.5)
WBC: 45.5 10*3/uL — ABNORMAL HIGH (ref 4.0–10.5)
nRBC: 0 % (ref 0.0–0.2)

## 2024-02-03 LAB — BRAIN NATRIURETIC PEPTIDE: B Natriuretic Peptide: 143.7 pg/mL — ABNORMAL HIGH (ref 0.0–100.0)

## 2024-02-03 LAB — COMPREHENSIVE METABOLIC PANEL WITH GFR
ALT: 25 U/L (ref 0–44)
AST: 24 U/L (ref 15–41)
Albumin: 3.3 g/dL — ABNORMAL LOW (ref 3.5–5.0)
Alkaline Phosphatase: 61 U/L (ref 38–126)
Anion gap: 8 (ref 5–15)
BUN: 17 mg/dL (ref 6–20)
CO2: 28 mmol/L (ref 22–32)
Calcium: 8.2 mg/dL — ABNORMAL LOW (ref 8.9–10.3)
Chloride: 97 mmol/L — ABNORMAL LOW (ref 98–111)
Creatinine, Ser: 1.18 mg/dL (ref 0.61–1.24)
GFR, Estimated: >60 mL/min
Glucose, Bld: 165 mg/dL — ABNORMAL HIGH (ref 70–99)
Potassium: 3.8 mmol/L (ref 3.5–5.1)
Sodium: 133 mmol/L — ABNORMAL LOW (ref 135–145)
Total Bilirubin: 2.1 mg/dL — ABNORMAL HIGH (ref 0.0–1.2)
Total Protein: 7.6 g/dL (ref 6.5–8.1)

## 2024-02-03 LAB — RESP PANEL BY RT-PCR (RSV, FLU A&B, COVID)  RVPGX2
Influenza A by PCR: NEGATIVE
Influenza B by PCR: NEGATIVE
Resp Syncytial Virus by PCR: NEGATIVE
SARS Coronavirus 2 by RT PCR: NEGATIVE

## 2024-02-03 LAB — PROTIME-INR
INR: 1.1 (ref 0.8–1.2)
Prothrombin Time: 14.8 s (ref 11.4–15.2)

## 2024-02-03 LAB — APTT: aPTT: 32 s (ref 24–36)

## 2024-02-03 LAB — I-STAT CG4 LACTIC ACID, ED: Lactic Acid, Venous: 1 mmol/L (ref 0.5–1.9)

## 2024-02-03 MED ORDER — LACTATED RINGERS IV BOLUS (SEPSIS)
1000.0000 mL | Freq: Once | INTRAVENOUS | Status: AC
  Administered 2024-02-03: 1000 mL via INTRAVENOUS

## 2024-02-03 MED ORDER — HYDROCODONE BIT-HOMATROP MBR 5-1.5 MG/5ML PO SOLN: 5.0000 mL | ORAL | Status: DC | PRN

## 2024-02-03 MED ORDER — SODIUM CHLORIDE 0.9 % IV SOLN
2.0000 g | INTRAVENOUS | Status: DC
  Administered 2024-02-04 – 2024-02-07 (×4): 2 g via INTRAVENOUS
  Filled 2024-02-03 (×4): qty 20

## 2024-02-03 MED ORDER — SODIUM CHLORIDE 0.9 % IV SOLN
500.0000 mg | INTRAVENOUS | Status: DC
  Administered 2024-02-04 – 2024-02-07 (×4): 500 mg via INTRAVENOUS
  Filled 2024-02-03 (×5): qty 5

## 2024-02-03 MED ORDER — SODIUM CHLORIDE 0.9 % IV SOLN
500.0000 mg | Freq: Once | INTRAVENOUS | Status: AC
  Administered 2024-02-03: 500 mg via INTRAVENOUS
  Filled 2024-02-03: qty 5

## 2024-02-03 MED ORDER — ACETAMINOPHEN 325 MG PO TABS: 650.0000 mg | ORAL_TABLET | Freq: Four times a day (QID) | ORAL | Status: DC | PRN

## 2024-02-03 MED ORDER — BUPROPION HCL ER (XL) 300 MG PO TB24
300.0000 mg | ORAL_TABLET | Freq: Every day | ORAL | Status: DC
  Administered 2024-02-04 – 2024-02-08 (×5): 300 mg via ORAL
  Filled 2024-02-03 (×5): qty 1

## 2024-02-03 MED ORDER — SODIUM CHLORIDE 0.9 % IV SOLN
2.0000 g | Freq: Once | INTRAVENOUS | Status: AC
  Administered 2024-02-03: 2 g via INTRAVENOUS
  Filled 2024-02-03: qty 20

## 2024-02-03 MED ORDER — ORAL CARE MOUTH RINSE: 15.0000 mL | OROMUCOSAL | Status: DC | PRN

## 2024-02-03 MED ORDER — ENOXAPARIN SODIUM 80 MG/0.8ML IJ SOSY
80.0000 mg | PREFILLED_SYRINGE | INTRAMUSCULAR | Status: DC
  Administered 2024-02-04 – 2024-02-08 (×5): 80 mg via SUBCUTANEOUS
  Filled 2024-02-03 (×5): qty 0.8

## 2024-02-03 MED ORDER — LOSARTAN POTASSIUM 25 MG PO TABS
25.0000 mg | ORAL_TABLET | Freq: Every day | ORAL | Status: DC
Start: 2024-02-04 — End: 2024-02-08
  Administered 2024-02-04 – 2024-02-08 (×5): 25 mg via ORAL
  Filled 2024-02-03 (×5): qty 1

## 2024-02-03 MED ORDER — SODIUM CHLORIDE 0.9 % IV SOLN: INTRAVENOUS | Status: DC

## 2024-02-03 MED ORDER — CARVEDILOL 6.25 MG PO TABS
6.2500 mg | ORAL_TABLET | Freq: Two times a day (BID) | ORAL | Status: DC
  Administered 2024-02-03 – 2024-02-08 (×10): 6.25 mg via ORAL
  Filled 2024-02-03 (×3): qty 2
  Filled 2024-02-03: qty 1
  Filled 2024-02-03 (×6): qty 2

## 2024-02-03 MED ORDER — FLUTICASONE PROPIONATE 50 MCG/ACT NA SUSP
1.0000 | Freq: Every day | NASAL | Status: DC
  Administered 2024-02-04 – 2024-02-08 (×5): 1 via NASAL
  Filled 2024-02-03: qty 16

## 2024-02-03 MED ORDER — ALBUTEROL SULFATE (2.5 MG/3ML) 0.083% IN NEBU: 2.5000 mg | INHALATION_SOLUTION | RESPIRATORY_TRACT | Status: DC | PRN

## 2024-02-03 MED ORDER — KETOROLAC TROMETHAMINE 15 MG/ML IJ SOLN
30.0000 mg | Freq: Once | INTRAMUSCULAR | Status: AC
  Administered 2024-02-04: 30 mg via INTRAVENOUS
  Filled 2024-02-03: qty 2

## 2024-02-03 MED ORDER — HYDROCOD POLI-CHLORPHE POLI ER 10-8 MG/5ML PO SUER
5.0000 mL | Freq: Two times a day (BID) | ORAL | Status: DC | PRN
  Administered 2024-02-03 – 2024-02-08 (×4): 5 mL via ORAL
  Filled 2024-02-03 (×4): qty 5

## 2024-02-03 MED ORDER — FUROSEMIDE 40 MG PO TABS
80.0000 mg | ORAL_TABLET | Freq: Two times a day (BID) | ORAL | Status: DC
  Administered 2024-02-03: 80 mg via ORAL
  Filled 2024-02-03: qty 2

## 2024-02-03 NOTE — H&P (Signed)
 History and Physical    PatientAddis Morales FMW:[id number] DOB: September 25, 1971 DOA: 02/03/2024 DOS: the patient was seen and examined on 02/03/2024 PCP: Hollowell, Isaiah Fellows, MD  Patient coming from: Home  Chief Complaint:  Chief Complaint  Patient presents with   Shortness of Breath   Cough   Fever   HPI: Joseph Morales is a 53 y.o. male with medical history significant of COPD, asthma, chronic diastolic heart failure, obesity hypoventilation syndrome, essential hypertension who presented to the ER with shortness of breath and cough.  Patient has chronic respiratory failure on 4 L.  Was seen in the ER yesterday treated did better on the left.  Came back from home with the shortness of breath, fever cough and oxygen 791% on 15 L.  Patient also is tachypneic with initial heart rate of 144.  Was seen and evaluated in the ER.  Patient has leukocytosis with white count of 45.5 thousand acute viral screening was negative for COVID and influenza. Chest x-ray showed left upper and left lower lobe pneumonia.  Patient will be admitted for further treatment.  Review of Systems: As mentioned in the history of present illness. All other systems reviewed and are negative. Past Medical History:  Diagnosis Date   Asthma    CHF (congestive heart failure) (HCC)    Chronic diastolic heart failure (HCC)    COPD (chronic obstructive pulmonary disease) (HCC)    Hypertension    Hypoventilation associated with obesity syndrome (HCC)    Obesity    Past Surgical History:  Procedure Laterality Date   NO PAST SURGERIES     Social History:  reports that he has quit smoking. His smoking use included cigarettes. He has a 20 pack-year smoking history. He has never used smokeless tobacco. He reports current drug use. Drug: Marijuana. He reports that he does not drink alcohol.  No Known Allergies  Family History  Problem Relation Age of Onset   Breast cancer Mother    Diabetes Mother    Emphysema Maternal  Grandmother    Congestive Heart Failure Maternal Grandmother     Prior to Admission medications   Medication Sig Start Date End Date Taking? Authorizing Provider  albuterol  (PROVENTIL ) (2.5 MG/3ML) 0.083% nebulizer solution Use 1 vial (2.5 mg total) by nebulization every 2 (two) hours as needed for wheezing. 12/26/21   Vann, Jessica U, DO  albuterol  (VENTOLIN  HFA) 108 (90 Base) MCG/ACT inhaler Inhale 2 puffs into the lungs every 6 (six) hours as needed for wheezing or shortness of breath.    [provider]  budesonide -formoterol  (SYMBICORT ) 160-4.5 MCG/ACT inhaler INHALE 2 PUFFS INTO THE LUNGS IN THE MORNING AND AT BEDTIME. Patient taking differently: Inhale 2 puffs into the lungs 2 (two) times daily. 06/18/21 06/18/22  Hope Almarie ORN, NP  carvedilol  (COREG ) 12.5 MG tablet Take 0.5 tablets (6.25 mg total) by mouth 2 (two) times daily. 05/31/22   Cheryle Page, MD  clobetasol cream (TEMOVATE) 0.05 % 1 application. daily as needed (eczema). 05/15/22   [provider]  [facility] MELATONIN 5 MG TABS Take 5 mg by mouth at bedtime as needed (sleep). 05/15/22   [provider]  fluticasone  (FLONASE ) 50 MCG/ACT nasal spray Place 1 spray into both nostrils daily. 05/15/22   [provider]  furosemide  (LASIX ) 40 MG tablet Take 80 mg by mouth 2 (two) times daily. 08/05/21   [provider]  losartan  (COZAAR ) 25 MG tablet Take 25 mg by mouth daily. 12/16/21   [provider]  OXYGEN Inhale 2-4 L into the lungs at bedtime.    [provider]  VIAGRA 25 MG tablet Take 25 mg by mouth daily as needed for erectile dysfunction. 05/15/22   [provider]  VICTOZA 18 MG/3ML SOPN Inject 3 mg into the skin at bedtime. To loose weight 11/14/21   [provider]  WELLBUTRIN  XL 300 MG 24 hr tablet Take 300 mg by mouth daily. 11/14/21   [provider]    Physical Exam: Vitals:   02/03/24 1603 02/03/24 1707 02/03/24 1720 02/03/24 1850   BP: 120/76  122/69 114/68  Pulse: (!) 137  (!) 135 (!) 128  Resp: (!) 26  (!) 35 (!) 30  Temp: (!) 100.5 F (38.1 C)     TempSrc: Oral     SpO2: 92%  95% 95%  Weight:  (!) 176.6 kg    Height:  6' 3 (1.905 m)     Constitutional: Morbidly obese, mild respiratory distress, comfortable Eyes: PERRL, lids and conjunctivae normal ENMT: Mucous membranes are moist. Posterior pharynx clear of any exudate or lesions.Normal dentition.  Neck: normal, supple, no masses, no thyromegaly Respiratory: Decreased air entry bilaterally, mild expiratory wheezing, no crackles. Normal respiratory effort. No accessory muscle use.  Cardiovascular: Sinus tachycardia, no murmurs / rubs / gallops. No extremity edema. 2+ pedal pulses. No carotid bruits.  Abdomen: no tenderness, no masses palpated. No hepatosplenomegaly. Bowel sounds positive.  Musculoskeletal: Good range of motion, no joint swelling or tenderness, Skin: no rashes, lesions, ulcers. No induration Neurologic: CN 2-12 grossly intact. Sensation intact, DTR normal. Strength 5/5 in all 4.  Psychiatric: Normal judgment and insight. Alert and oriented x 3. Normal mood  Data Reviewed:  Temperature 101, blood pressure 160/92, pulse 128 respiratory rate of 37 oxygen sats 91% on 15 L white count 45.5 thousand, checks x-ray showed left upper lower lobe pneumonia and EKG showed sinus tachycardia sodium 133, chloride 97 glucose 165.  Assessment and Plan:  #1 sepsis due to pneumonia: Patient meets sepsis criteria.  Will admit the patient and initiate the sepsis protocol as a result of pneumonia.  Check lactic acid level.  Initiate IV Rocephin  and Zithromax .  Blood cultures have been obtained and will follow.  Oxygen will be continued with.  Acute viral screen is negative.  Continue to monitor  #2 community-acquired pneumonia: Continue as above.  Antibiotics and supportive care.  #3 obesity hypoventilation syndrome: Continue oxygen.  Chronic respiratory failure  noted.  #4 acute on chronic hypoxic respiratory failure: Secondary to pneumonia and possibly asthma exacerbation.  Will continue adding steroids to antibiotics.  Continue to titrate oxygen down  #5 morbid obesity: Dietary counseling  #6 leukocytosis: Most likely due to sepsis and pneumonia.  Continue to monitor  #7 hyponatremia: Most likely dehydration.  Gentle hydration.  #8 hyperglycemia: Not a nondiabetic.  Patient will be on steroids.  Will monitor blood sugar.  #9 hyperlipidemia: Continue with statin  #10 essential hypertension: Continue to monitor blood pressure.  Overall stable.  #11 chronic venous insufficiency: Elevate feet.    Advance Care Planning:   Code Status: Full Code   Consults: None  Family Communication: No family at bedside  Severity of Illness: The appropriate patient status for this patient is INPATIENT. Inpatient status is judged to be reasonable and necessary in order to provide the required intensity of service to ensure the patient's safety. The patient's presenting symptoms, physical exam findings, and initial radiographic and laboratory data in the context of their chronic  comorbidities is felt to place them at high risk for further clinical deterioration. Furthermore, it is not anticipated that the patient will be medically stable for discharge from the hospital within 2 midnights of admission.   * I certify that at the point of admission it is my clinical judgment that the patient will require inpatient hospital care spanning beyond 2 midnights from the point of admission due to high intensity of service, high risk for further deterioration and high frequency of surveillance required.*  AuthorBETHA SIM KNOLL, MD 02/03/2024 8:04 PM  For on call review [email or web address].

## 2024-02-03 NOTE — ED Notes (Addendum)
 Placed pt on 6L [place] stated it was more comfortable than the Non rebreather

## 2024-02-03 NOTE — ED Provider Notes (Signed)
 [place] EMERGENCY DEPARTMENT AT [facility] Provider Note   CSN: [id number] Arrival date & time: 02/03/24  1552     History  Chief Complaint  Patient presents with   Shortness of Breath   Cough   Fever    Joseph Morales is a 53 y.o. male.  HPI Patient presents with ongoing dyspnea, fatigue, diaphoresis. Patient has history of CHF, COPD, and asthma.  Over the past week he has had worsening symptoms, and after being seen, started in evaluation yesterday had to leave due to family concerns.  He presents today due to worsening dyspnea, fatigue.  No chest pain, no abdominal pain, no vomiting.  He is unaware of a fever. He is a former smoker as well.    Home Medications Prior to Admission medications   Medication Sig Start Date End Date Taking? Authorizing Provider  albuterol  (PROVENTIL ) (2.5 MG/3ML) 0.083% nebulizer solution Use 1 vial (2.5 mg total) by nebulization every 2 (two) hours as needed for wheezing. 12/26/21   Vann, Jessica U, DO  albuterol  (VENTOLIN  HFA) 108 (90 Base) MCG/ACT inhaler Inhale 2 puffs into the lungs every 6 (six) hours as needed for wheezing or shortness of breath.    [provider]  budesonide -formoterol  (SYMBICORT ) 160-4.5 MCG/ACT inhaler INHALE 2 PUFFS INTO THE LUNGS IN THE MORNING AND AT BEDTIME. Patient taking differently: Inhale 2 puffs into the lungs 2 (two) times daily. 06/18/21 06/18/22  Hope Almarie ORN, NP  carvedilol  (COREG ) 12.5 MG tablet Take 0.5 tablets (6.25 mg total) by mouth 2 (two) times daily. 05/31/22   Cheryle Page, MD  clobetasol cream (TEMOVATE) 0.05 % 1 application. daily as needed (eczema). 05/15/22   [provider]  [employer] MELATONIN 5 MG TABS Take 5 mg by mouth at bedtime as needed (sleep). 05/15/22   [provider]  fluticasone  (FLONASE ) 50 MCG/ACT nasal spray Place 1 spray into both nostrils daily. 05/15/22   [provider]  furosemide  (LASIX ) 40 MG tablet Take 80 mg by mouth 2 (two) times  daily. 08/05/21   [provider]  losartan  (COZAAR ) 25 MG tablet Take 25 mg by mouth daily. 12/16/21   [provider]  OXYGEN Inhale 2-4 L into the lungs at bedtime.    [provider]  VIAGRA 25 MG tablet Take 25 mg by mouth daily as needed for erectile dysfunction. 05/15/22   [provider]  VICTOZA 18 MG/3ML SOPN Inject 3 mg into the skin at bedtime. To loose weight 11/14/21   [provider]  WELLBUTRIN  XL 300 MG 24 hr tablet Take 300 mg by mouth daily. 11/14/21   [provider]      Allergies    Patient has no known allergies.    Review of Systems   Review of Systems  Physical Exam Updated Vital Signs BP 114/68   Pulse (!) 128   Temp (!) 100.5 F (38.1 C) (Oral)   Resp (!) 30   Ht 6' 3 (1.905 m)   Wt (!) 176.6 kg   SpO2 95%   BMI 48.66 kg/m  Physical Exam Vitals and nursing note reviewed.  Constitutional:      Appearance: He is obese. He is ill-appearing and diaphoretic.  HENT:     Head: Normocephalic and atraumatic.  Eyes:     Conjunctiva/sclera: Conjunctivae normal.  Cardiovascular:     Rate and Rhythm: Normal rate and regular rhythm.  Pulmonary:     Effort: Tachypnea, accessory muscle usage and respiratory distress present.  Breath sounds: Decreased breath sounds present.  Abdominal:     General: There is no distension.  Skin:    General: Skin is warm.  Neurological:     Mental Status: He is alert and oriented to person, place, and time.     ED Results / Procedures / Treatments   Labs (all labs ordered are listed, but only abnormal results are displayed) Labs Reviewed  COMPREHENSIVE METABOLIC PANEL - Abnormal; Notable for the following components:      Result Value   Sodium 133 (*)    Chloride 97 (*)    Glucose, Bld 165 (*)    Calcium  8.2 (*)    Albumin 3.3 (*)    Total Bilirubin 2.1 (*)    All other components within normal limits  CBC WITH DIFFERENTIAL/PLATELET - Abnormal; Notable for the  following components:   WBC 45.5 (*)    Neutro Abs 37.1 (*)    Monocytes Absolute 4.0 (*)    Abs Immature Granulocytes 2.54 (*)    All other components within normal limits  BRAIN NATRIURETIC PEPTIDE - Abnormal; Notable for the following components:   B Natriuretic Peptide 143.7 (*)    All other components within normal limits  RESP PANEL BY RT-PCR (RSV, FLU A&B, COVID)  RVPGX2  CULTURE, BLOOD (ROUTINE X 2)  CULTURE, BLOOD (ROUTINE X 2)  PROTIME-INR  APTT  PATHOLOGIST SMEAR REVIEW  I-STAT CG4 LACTIC ACID, ED    EKG EKG Interpretation Date/Time:  Wednesday February 03 2024 16:32:21 EST Ventricular Rate:  133 PR Interval:  141 QRS Duration:  105 QT Interval:  279 QTC Calculation: 415 R Axis:   81  Text Interpretation: Sinus tachycardia RSR' in V1 or V2, right VCD or RVH Confirmed by Garrick Charleston [phone] on 02/03/2024 4:38:08 PM  Radiology DG Chest Port 1 View Result Date: 02/03/2024 CLINICAL DATA:  Sepsis. EXAM: PORTABLE CHEST 1 VIEW COMPARISON:  Chest radiograph dated 02/02/2024 and CT dated 02/02/2024. FINDINGS: Areas of airspace opacity in the left upper and left lower lobes, progressed since the prior radiograph. No pleural effusion or pneumothorax. The cardiac silhouette is within normal limits. No acute osseous pathology. IMPRESSION: Left upper and left lower lobe pneumonia. Electronically Signed   By: Vanetta Chou M.D.   On: 02/03/2024 18:03   CT Angio Chest PE W and/or Wo Contrast Result Date: 02/02/2024 CLINICAL DATA:  Pulmonary embolism (PE) suspected, low to intermediate prob, positive D-dimer Shortness of breath and fever for 4 days. EXAM: CT ANGIOGRAPHY CHEST WITH CONTRAST TECHNIQUE: Multidetector CT imaging of the chest was performed using the standard protocol during bolus administration of intravenous contrast. Multiplanar CT image reconstructions and MIPs were obtained to evaluate the vascular anatomy. RADIATION DOSE REDUCTION: This exam was performed according to  the departmental dose-optimization program which includes automated exposure control, adjustment of the mA and/or kV according to patient size and/or use of iterative reconstruction technique. CONTRAST:  75mL OMNIPAQUE  IOHEXOL  350 MG/ML SOLN COMPARISON:  Radiographs 02/02/2024 and 11/09/2023.  CT 10/16/2020. FINDINGS: Technical note: Despite efforts by the technologist and patient, mild motion artifact is present on today's exam and could not be eliminated. This reduces exam sensitivity and specificity. Cardiovascular: The pulmonary arteries are adequately opacified with contrast to the level of the segmental branches. There is no evidence of acute pulmonary embolism. No acute systemic arterial abnormalities are identified. The heart size is normal. There is no pericardial effusion. Mediastinum/Nodes: There are no enlarged mediastinal, hilar or axillary lymph nodes. The thyroid gland,  trachea and esophagus demonstrate no significant findings. Lungs/Pleura: No pleural effusion or pneumothorax. Pulmonary assessment mildly limited by breathing artifact. There are multifocal patchy nodular airspace opacities within the left upper lobe, lingula and anterior left lower lobe consistent with multilobar pneumonia. The right lung is clear. Upper abdomen:  No acute findings.  Probable mild hepatic steatosis. Musculoskeletal/Chest wall: There is no chest wall mass or suspicious osseous finding. Review of the MIP images confirms the above findings. IMPRESSION: 1. No evidence of acute pulmonary embolism or other acute vascular findings in the chest. 2. Multifocal patchy nodular airspace opacities within the left upper lobe, lingula and anterior left lower lobe consistent with multilobar pneumonia. Radiographic follow-up recommended to ensure resolution. 3. Probable mild hepatic steatosis. Electronically Signed   By: Elsie Perone M.D.   On: 02/02/2024 17:23   DG Chest Portable 1 View Result Date: 02/02/2024 CLINICAL DATA:   Shortness of breath and fever. EXAM: PORTABLE CHEST 1 VIEW COMPARISON:  Chest x-ray dated November 09, 2023. FINDINGS: The patient is rotated to the right. The heart size and mediastinal contours are within normal limits. Normal pulmonary vascularity. Hazy density at the right lung base is felt to be overlapping soft tissue. No focal consolidation, pleural effusion, or pneumothorax. No acute osseous abnormality. IMPRESSION: 1. No active disease. Electronically Signed   By: Elsie ONEIDA Shoulder M.D.   On: 02/02/2024 14:11    Procedures Procedures    Medications Ordered in ED Medications  lactated ringers  bolus 1,000 mL (1,000 mLs Intravenous New Bag/Given 02/03/24 1630)  cefTRIAXone  (ROCEPHIN ) 2 g in sodium chloride  0.9 % 100 mL IVPB (0 g Intravenous Stopped 02/03/24 1659)  azithromycin  (ZITHROMAX ) 500 mg in sodium chloride  0.9 % 250 mL IVPB (0 mg Intravenous Stopped 02/03/24 1856)    ED Course/ Medical Decision Making/ A&P                                 Medical Decision Making Adult male with multiple pulmonology issues presents with dyspnea, fatigue, cough for 1 week.  Concern for multifactorial respiratory failure, pneumonia, COPD exacerbation, CHF, bacteremia, sepsis, flu. Patient required 15 L via supplemental oxygen after arrival for saturation 94% which is abnormal Cardiac 130 sinus tach abnormal   Amount and/or Complexity of Data Reviewed External Data Reviewed: notes.    Details: Eval from yesterday reviewed, CT with pneumonia noted Labs: ordered. Decision-making details documented in ED Course. Radiology: ordered and independent interpretation performed. Decision-making details documented in ED Course. ECG/medicine tests: ordered and independent interpretation performed. Decision-making details documented in ED Course.  Risk Prescription drug management. Decision regarding hospitalization. Diagnosis or treatment significantly limited by social determinants of health.   Meets  sepsis criteria, with tachycardia, tachypnea, respiratory compromise, CT evidence for pneumonia.  Patient has no current evidence for septic shock, no hypotension.  Patient designated as a code sepsis, broad antibiotics started, monitoring continued. Patient's initial lactic acidosis of 1 is somewhat reassuring.  7:51 PM Patient markedly better clinically on her parents, but tachycardia, tachypnea are persistent. Final Clinical Impression(s) / ED Diagnoses Final diagnoses:  Severe sepsis (HCC)  CRITICAL CARE Performed by: Lamar Salen Total critical care time: 35 minutes Critical care time was exclusive of separately billable procedures and treating other patients. Critical care was necessary to treat or prevent imminent or life-threatening deterioration. Critical care was time spent personally by me on the following activities: development of treatment plan with patient and/or surrogate  as well as nursing, discussions with consultants, evaluation of patient's response to treatment, examination of patient, obtaining history from patient or surrogate, ordering and performing treatments and interventions, ordering and review of laboratory studies, ordering and review of radiographic studies, pulse oximetry and re-evaluation of patient's condition.    Garrick Charleston, MD 02/03/24 [phone]

## 2024-02-03 NOTE — ED Triage Notes (Signed)
 From home SOB, fever, cough. Hx. Chf and asthma 4l [place] baseline 91% on 15L 142/76 144 syn tach 158 cbg

## 2024-02-03 NOTE — Progress Notes (Signed)
Elink is following this code sepsis ?

## 2024-02-04 DIAGNOSIS — J189 Pneumonia, unspecified organism: Secondary | ICD-10-CM | POA: Diagnosis not present

## 2024-02-04 LAB — RESPIRATORY PANEL BY PCR

## 2024-02-04 LAB — CBC
HCT: 45.2 % (ref 39.0–52.0)
Hemoglobin: 13.7 g/dL (ref 13.0–17.0)
MCH: 28.7 pg (ref 26.0–34.0)
MCHC: 30.3 g/dL (ref 30.0–36.0)
MCV: 94.6 fL (ref 80.0–100.0)
Platelets: 256 10*3/uL (ref 150–400)
RBC: 4.78 MIL/uL (ref 4.22–5.81)
RDW: 13 % (ref 11.5–15.5)
WBC: 48 10*3/uL — ABNORMAL HIGH (ref 4.0–10.5)
nRBC: 0 % (ref 0.0–0.2)

## 2024-02-04 LAB — COMPREHENSIVE METABOLIC PANEL
ALT: 31 U/L (ref 0–44)
AST: 25 U/L (ref 15–41)
Albumin: 3.3 g/dL — ABNORMAL LOW (ref 3.5–5.0)
Alkaline Phosphatase: 69 U/L (ref 38–126)
Anion gap: 10 (ref 5–15)
BUN: 17 mg/dL (ref 6–20)
CO2: 31 mmol/L (ref 22–32)
Calcium: 8.4 mg/dL — ABNORMAL LOW (ref 8.9–10.3)
Chloride: 94 mmol/L — ABNORMAL LOW (ref 98–111)
Creatinine, Ser: 1.15 mg/dL (ref 0.61–1.24)
GFR, Estimated: >60 mL/min (ref >60)
Glucose, Bld: 136 mg/dL — ABNORMAL HIGH (ref 70–99)
Potassium: 4.4 mmol/L (ref 3.5–5.1)
Sodium: 135 mmol/L (ref 135–145)
Total Bilirubin: 2.1 mg/dL — ABNORMAL HIGH (ref 0.0–1.2)
Total Protein: 8 g/dL (ref 6.5–8.1)

## 2024-02-04 LAB — HIV ANTIBODY (ROUTINE TESTING W REFLEX): HIV Screen 4th Generation wRfx: NONREACTIVE

## 2024-02-04 LAB — LACTIC ACID, PLASMA
Lactic Acid, Venous: 1.3 mmol/L (ref 0.5–1.9)
Lactic Acid, Venous: 0.8 mmol/L (ref 0.5–1.9)

## 2024-02-04 LAB — PROCALCITONIN: Procalcitonin: 3.03 ng/mL

## 2024-02-04 MED ORDER — ACETAMINOPHEN 325 MG PO TABS: 650.0000 mg | ORAL_TABLET | Freq: Four times a day (QID) | ORAL | Status: DC | PRN

## 2024-02-04 MED ORDER — FUROSEMIDE 40 MG PO TABS
80.0000 mg | ORAL_TABLET | Freq: Two times a day (BID) | ORAL | Status: DC
Start: 2024-02-04 — End: 2024-02-08
  Administered 2024-02-04 – 2024-02-08 (×8): 80 mg via ORAL
  Filled 2024-02-04 (×8): qty 2

## 2024-02-04 MED ORDER — TRAZODONE HCL 50 MG PO TABS: 50.0000 mg | ORAL_TABLET | Freq: Every evening | ORAL | Status: DC | PRN

## 2024-02-04 MED ORDER — ONDANSETRON HCL 4 MG/2ML IJ SOLN: 4.0000 mg | Freq: Four times a day (QID) | INTRAMUSCULAR | Status: DC | PRN

## 2024-02-04 MED ORDER — METOPROLOL TARTRATE 5 MG/5ML IV SOLN: 5.0000 mg | INTRAVENOUS | Status: DC | PRN

## 2024-02-04 MED ORDER — SENNOSIDES-DOCUSATE SODIUM 8.6-50 MG PO TABS: 1.0000 | ORAL_TABLET | Freq: Every evening | ORAL | Status: DC | PRN

## 2024-02-04 MED ORDER — HYDRALAZINE HCL 20 MG/ML IJ SOLN: 10.0000 mg | INTRAMUSCULAR | Status: DC | PRN

## 2024-02-04 MED ORDER — IPRATROPIUM-ALBUTEROL 0.5-2.5 (3) MG/3ML IN SOLN
3.0000 mL | Freq: Four times a day (QID) | RESPIRATORY_TRACT | Status: DC
Start: 2024-02-04 — End: 2024-02-07
  Administered 2024-02-04 – 2024-02-07 (×11): 3 mL via RESPIRATORY_TRACT
  Filled 2024-02-04 (×11): qty 3

## 2024-02-04 MED ORDER — FUROSEMIDE 10 MG/ML IJ SOLN
80.0000 mg | Freq: Once | INTRAMUSCULAR | Status: AC
  Administered 2024-02-04: 80 mg via INTRAVENOUS
  Filled 2024-02-04: qty 8

## 2024-02-04 MED ORDER — IPRATROPIUM-ALBUTEROL 0.5-2.5 (3) MG/3ML IN SOLN: 3.0000 mL | RESPIRATORY_TRACT | Status: DC | PRN

## 2024-02-04 NOTE — Plan of Care (Signed)
   Problem: Education: Goal: Knowledge of General Education information will improve Description Including pain rating scale, medication(s)/side effects and non-pharmacologic comfort measures Outcome: Progressing

## 2024-02-04 NOTE — Progress Notes (Signed)
 PROGRESS NOTE    Joseph Morales  FMW:[id number] DOB: 1971/01/08 DOA: 02/03/2024 PCP: Corinthia Isaiah Fellows, MD    Brief Narrative:  53 year old with history of COPD, asthma, diastolic CHF, obesity hypoventilation syndrome, HTN came to the ER with complaints of shortness of breath and cough.  Chronically uses 4 L of nasal cannula at home and in the ER requiring 15 L.  Chest x-ray showed left upper and lower lobe pneumonia.  Had signs of sepsis and started on Rocephin  and azithromycin .   Assessment & Plan:  Principal Problem:   CAP (community acquired pneumonia) Active Problems:   Asthma   Acute on chronic diastolic CHF (congestive heart failure) (HCC)   Chronic venous insufficiency   Obesity, Class III, BMI 40-49.9 (morbid obesity) (HCC)   Chronic respiratory failure with hypoxia and hypercapnia (HCC)   Essential hypertension   Hyperlipidemia   Acute respiratory distress secondary to community-acquired pneumonia Sepsis secondary to community-acquired pneumonia Acute on chronic hypoxia -At home uses 4 L nasal cannula, currently on 15 L high flow.  COVID/RSV/flu is negative.  Will check respiratory panel.  Bronchodilators, I-S/flutter valve.  Out of bed to chair.  Procalcitonin elevated, continue antibiotics.  Mildly elevated BNP as well.  Acute on chronic diastolic CHF - Very mild signs of volume overload, will give 1 dose of Lasix  80 mg IV followed by his home p.o.  Continue home Coreg , Lasix , losartan .  Obesity hypoventilation syndrome -Supportive care and supplemental oxygen  Hyponatremia -Resolved  Leukocytosis - Chronically elevated WBC greater than 25.  This morning 48.  I am not sure if patient is following outpatient with any provider for this.  I do not see any workup in our system in Care Everywhere.  Once he is over this acute phase, he should follow-up with outpatient hematology to workup his chronic leukocytosis   Essential hypertension -Coreg , losartan , Lasix  p.o.  twice daily.  IV as needed    Diabetes mellitus type 2 -This is per history.  Will check A1c.  Sliding scale and Accu-Cheks  Chronic venous insufficiency -Supportive care and elevate legs  Depression/anxiety - On Wellbutrin     DVT prophylaxis: Lovenox      Code Status: Full Code Family Communication:   Status is: Inpatient Remains inpatient appropriate because: Continue hospital stay for management of hypoxia    Subjective: Seen at bed side, tells me he feels a little better compared to yesterday.   Examination:  General exam: Appears calm and comfortable, morbidly obese Respiratory system: Clear to auscultation. Respiratory effort normal. Cardiovascular system: S1 & S2 heard, RRR. No JVD, murmurs, rubs, gallops or clicks. No pedal edema. Gastrointestinal system: Abdomen is nondistended, soft and nontender. No organomegaly or masses felt. Normal bowel sounds heard. Central nervous system: Alert and oriented. No focal neurological deficits. Extremities: Symmetric 5 x 5 power. Skin: No rashes, lesions or ulcers Psychiatry: Judgement and insight appear normal. Mood & affect appropriate.                Diet Orders (From admission, onward)     Start     Ordered   02/03/24 2220  Diet Heart Room service appropriate? Yes; Fluid consistency: Thin  Diet effective now       Question Answer Comment  Room service appropriate? Yes   Fluid consistency: Thin      02/03/24 2219            Objective: Vitals:   02/03/24 2223 02/04/24 0036 02/04/24 0436 02/04/24 0828  BP: (!) 147/80  123/62 122/61 132/62  Pulse: (!) 123 (!) 115 (!) 109 (!) 105  Resp: 20 (!) 21 19 (!) 22  Temp: 98.4 F (36.9 C) 98.2 F (36.8 C) 98.5 F (36.9 C) 99.2 F (37.3 C)  TempSrc: Oral Oral Oral Oral  SpO2: 94% 95% 97% 98%  Weight:      Height:        Intake/Output Summary (Last 24 hours) at 02/04/2024 1233 Last data filed at 02/04/2024 1217 Gross per 24 hour  Intake 490 ml  Output  3375 ml  Net -2885 ml   Filed Weights   02/03/24 1707  Weight: (!) 176.6 kg    Scheduled Meds:  buPROPion   300 mg Oral Daily   carvedilol   6.25 mg Oral BID   enoxaparin  (LOVENOX ) injection  80 mg Subcutaneous Q24H   fluticasone   1 spray Each Nare Daily   furosemide   80 mg Oral BID   ipratropium-albuterol   3 mL Nebulization Q6H   losartan   25 mg Oral Daily   Continuous Infusions:  sodium chloride  10 mL/hr at 02/04/24 1108   azithromycin      cefTRIAXone  (ROCEPHIN )  IV      Nutritional status     Body mass index is 48.66 kg/m.  Data Reviewed:   CBC: Recent Labs  Lab 02/02/24 1314 02/03/24 1620 02/04/24 0448  WBC 28.2* 45.5* 48.0*  NEUTROABS 24.4* 37.1*  --   HGB 14.3 13.9 13.7  HCT 46.5 44.6 45.2  MCV 93.2 92.9 94.6  PLT 247 244 256   Basic Metabolic Panel: Recent Labs  Lab 02/02/24 1314 02/03/24 1620 02/04/24 0448  NA 136 133* 135  K 4.1 3.8 4.4  CL 97* 97* 94*  CO2 31 28 31   GLUCOSE 180* 165* 136*  BUN 15 17 17   CREATININE 1.08 1.18 1.15  CALCIUM  8.8* 8.2* 8.4*  MG 1.6*  --   --    GFR: Estimated Creatinine Clearance: 131.8 mL/min (by C-G formula based on SCr of 1.15 mg/dL). Liver Function Tests: Recent Labs  Lab 02/02/24 1314 02/03/24 1620 02/04/24 0448  AST 24 24 25   ALT 22 25 31   ALKPHOS 68 61 69  BILITOT 1.7* 2.1* 2.1*  PROT 8.5* 7.6 8.0  ALBUMIN 4.0 3.3* 3.3*   No results for input(s): LIPASE, AMYLASE in the last 168 hours. No results for input(s): AMMONIA in the last 168 hours. Coagulation Profile: Recent Labs  Lab 02/03/24 1620  INR 1.1   Cardiac Enzymes: No results for input(s): CKTOTAL, CKMB, CKMBINDEX, TROPONINI in the last 168 hours. BNP (last 3 results) No results for input(s): PROBNP in the last 8760 hours. HbA1C: No results for input(s): HGBA1C in the last 72 hours. CBG: No results for input(s): GLUCAP in the last 168 hours. Lipid Profile: No results for input(s): CHOL, HDL, LDLCALC,  TRIG, CHOLHDL, LDLDIRECT in the last 72 hours. Thyroid Function Tests: No results for input(s): TSH, T4TOTAL, FREET4, T3FREE, THYROIDAB in the last 72 hours. Anemia Panel: No results for input(s): VITAMINB12, FOLATE, FERRITIN, TIBC, IRON, RETICCTPCT in the last 72 hours. Sepsis Labs: Recent Labs  Lab 02/03/24 1626 02/04/24 0120 02/04/24 0448  PROCALCITON  --   --  3.03  LATICACIDVEN 1.0 1.3 0.8    Recent Results (from the past 240 hours)  Resp panel by RT-PCR (RSV, Flu A&B, Covid) Anterior Nasal Swab     Status: None   Collection Time: 02/02/24 12:44 PM   Specimen: Anterior Nasal Swab  Result Value Ref Range Status   SARS  Coronavirus 2 by RT PCR NEGATIVE NEGATIVE Final    Comment: (NOTE) SARS-CoV-2 target nucleic acids are NOT DETECTED.  The SARS-CoV-2 RNA is generally detectable in upper respiratory specimens during the acute phase of infection. The lowest concentration of SARS-CoV-2 viral copies this assay can detect is 138 copies/mL. A negative result does not preclude SARS-Cov-2 infection and should not be used as the sole basis for treatment or other patient management decisions. A negative result may occur with  improper specimen collection/handling, submission of specimen other than nasopharyngeal swab, presence of viral mutation(s) within the areas targeted by this assay, and inadequate number of viral copies(<138 copies/mL). A negative result must be combined with clinical observations, patient history, and epidemiological information. The expected result is Negative.  Fact Sheet for Patients:  bloggercourse.com  Fact Sheet for Healthcare Providers:  seriousbroker.it  This test is no t yet approved or cleared by the [place]  FDA and  has been authorized for detection and/or diagnosis of SARS-CoV-2 by FDA under an Emergency Use Authorization (EUA). This EUA will remain  in effect  (meaning this test can be used) for the duration of the COVID-19 declaration under Section 564(b)(1) of the Act, 21 U.S.C.section 360bbb-3(b)(1), unless the authorization is terminated  or revoked sooner.       Influenza A by PCR NEGATIVE NEGATIVE Final   Influenza B by PCR NEGATIVE NEGATIVE Final    Comment: (NOTE) The Xpert Xpress SARS-CoV-2/FLU/RSV plus assay is intended as an aid in the diagnosis of influenza from Nasopharyngeal swab specimens and should not be used as a sole basis for treatment. Nasal washings and aspirates are unacceptable for Xpert Xpress SARS-CoV-2/FLU/RSV testing.  Fact Sheet for Patients: bloggercourse.com  Fact Sheet for Healthcare Providers: seriousbroker.it  This test is not yet approved or cleared by the [place]  FDA and has been authorized for detection and/or diagnosis of SARS-CoV-2 by FDA under an Emergency Use Authorization (EUA). This EUA will remain in effect (meaning this test can be used) for the duration of the COVID-19 declaration under Section 564(b)(1) of the Act, 21 U.S.C. section 360bbb-3(b)(1), unless the authorization is terminated or revoked.     Resp Syncytial Virus by PCR NEGATIVE NEGATIVE Final    Comment: (NOTE) Fact Sheet for Patients: bloggercourse.com  Fact Sheet for Healthcare Providers: seriousbroker.it  This test is not yet approved or cleared by the [place]  FDA and has been authorized for detection and/or diagnosis of SARS-CoV-2 by FDA under an Emergency Use Authorization (EUA). This EUA will remain in effect (meaning this test can be used) for the duration of the COVID-19 declaration under Section 564(b)(1) of the Act, 21 U.S.C. section 360bbb-3(b)(1), unless the authorization is terminated or revoked.  Performed at [facility], [address] [place], [place] [address]   Blood  Culture (routine x 2)     Status: None (Preliminary result)   Collection Time: 02/03/24  4:15 PM   Specimen: BLOOD  Result Value Ref Range Status   Specimen Description   Final    BLOOD SITE NOT SPECIFIED Performed at [facility], [address] [place], [place] [address]    Special Requests   Final    BOTTLES DRAWN AEROBIC AND ANAEROBIC Blood Culture adequate volume Performed at [facility], [address], [place], [place] [address]    Culture   Final    NO GROWTH < 12 HOURS Performed at [facility], [address], [place], [place] [address]  Report Status PENDING  Incomplete  Resp panel by RT-PCR (RSV, Flu A&B, Covid) Anterior Nasal Swab     Status: None   Collection Time: 02/03/24  4:20 PM   Specimen: Anterior Nasal Swab  Result Value Ref Range Status   SARS Coronavirus 2 by RT PCR NEGATIVE NEGATIVE Final    Comment: (NOTE) SARS-CoV-2 target nucleic acids are NOT DETECTED.  The SARS-CoV-2 RNA is generally detectable in upper respiratory specimens during the acute phase of infection. The lowest concentration of SARS-CoV-2 viral copies this assay can detect is 138 copies/mL. A negative result does not preclude SARS-Cov-2 infection and should not be used as the sole basis for treatment or other patient management decisions. A negative result may occur with  improper specimen collection/handling, submission of specimen other than nasopharyngeal swab, presence of viral mutation(s) within the areas targeted by this assay, and inadequate number of viral copies(<138 copies/mL). A negative result must be combined with clinical observations, patient history, and epidemiological information. The expected result is Negative.  Fact Sheet for Patients:  bloggercourse.com  Fact Sheet for Healthcare Providers:  seriousbroker.it  This test is no t yet approved or cleared by the [place]  [place] FDA and  has been authorized for detection and/or diagnosis of SARS-CoV-2 by FDA under an Emergency Use Authorization (EUA). This EUA will remain  in effect (meaning this test can be used) for the duration of the COVID-19 declaration under Section 564(b)(1) of the Act, 21 U.S.C.section 360bbb-3(b)(1), unless the authorization is terminated  or revoked sooner.       Influenza A by PCR NEGATIVE NEGATIVE Final   Influenza B by PCR NEGATIVE NEGATIVE Final    Comment: (NOTE) The Xpert Xpress SARS-CoV-2/FLU/RSV plus assay is intended as an aid in the diagnosis of influenza from Nasopharyngeal swab specimens and should not be used as a sole basis for treatment. Nasal washings and aspirates are unacceptable for Xpert Xpress SARS-CoV-2/FLU/RSV testing.  Fact Sheet for Patients: bloggercourse.com  Fact Sheet for Healthcare Providers: seriousbroker.it  This test is not yet approved or cleared by the [place]  FDA and has been authorized for detection and/or diagnosis of SARS-CoV-2 by FDA under an Emergency Use Authorization (EUA). This EUA will remain in effect (meaning this test can be used) for the duration of the COVID-19 declaration under Section 564(b)(1) of the Act, 21 U.S.C. section 360bbb-3(b)(1), unless the authorization is terminated or revoked.     Resp Syncytial Virus by PCR NEGATIVE NEGATIVE Final    Comment: (NOTE) Fact Sheet for Patients: bloggercourse.com  Fact Sheet for Healthcare Providers: seriousbroker.it  This test is not yet approved or cleared by the [place]  FDA and has been authorized for detection and/or diagnosis of SARS-CoV-2 by FDA under an Emergency Use Authorization (EUA). This EUA will remain in effect (meaning this test can be used) for the duration of the COVID-19 declaration under Section 564(b)(1) of the Act, 21 U.S.C. section  360bbb-3(b)(1), unless the authorization is terminated or revoked.  Performed at [facility], [address] [place], [place] [address]   Blood Culture (routine x 2)     Status: None (Preliminary result)   Collection Time: 02/04/24  1:20 AM   Specimen: BLOOD  Result Value Ref Range Status   Specimen Description   Final    BLOOD BLOOD RIGHT ARM Performed at [facility], [address] [place], [place] [address]    Special Requests   Final    BOTTLES DRAWN AEROBIC ONLY  Blood Culture adequate volume Performed at [facility], [address], [place], [place] [address]    Culture   Final    NO GROWTH <12 HOURS Performed at [facility], [address] [place], [place] [address]    Report Status PENDING  Incomplete         Radiology Studies: DG Chest Port 1 View Result Date: 02/03/2024 CLINICAL DATA:  Sepsis. EXAM: PORTABLE CHEST 1 VIEW COMPARISON:  Chest radiograph dated 02/02/2024 and CT dated 02/02/2024. FINDINGS: Areas of airspace opacity in the left upper and left lower lobes, progressed since the prior radiograph. No pleural effusion or pneumothorax. The cardiac silhouette is within normal limits. No acute osseous pathology. IMPRESSION: Left upper and left lower lobe pneumonia. Electronically Signed   By: Vanetta Chou M.D.   On: 02/03/2024 18:03   CT Angio Chest PE W and/or Wo Contrast Result Date: 02/02/2024 CLINICAL DATA:  Pulmonary embolism (PE) suspected, low to intermediate prob, positive D-dimer Shortness of breath and fever for 4 days. EXAM: CT ANGIOGRAPHY CHEST WITH CONTRAST TECHNIQUE: Multidetector CT imaging of the chest was performed using the standard protocol during bolus administration of intravenous contrast. Multiplanar CT image reconstructions and MIPs were obtained to evaluate the vascular anatomy. RADIATION DOSE REDUCTION: This exam was performed according to the departmental dose-optimization  program which includes automated exposure control, adjustment of the mA and/or kV according to patient size and/or use of iterative reconstruction technique. CONTRAST:  75mL OMNIPAQUE  IOHEXOL  350 MG/ML SOLN COMPARISON:  Radiographs 02/02/2024 and 11/09/2023.  CT 10/16/2020. FINDINGS: Technical note: Despite efforts by the technologist and patient, mild motion artifact is present on today's exam and could not be eliminated. This reduces exam sensitivity and specificity. Cardiovascular: The pulmonary arteries are adequately opacified with contrast to the level of the segmental branches. There is no evidence of acute pulmonary embolism. No acute systemic arterial abnormalities are identified. The heart size is normal. There is no pericardial effusion. Mediastinum/Nodes: There are no enlarged mediastinal, hilar or axillary lymph nodes. The thyroid gland, trachea and esophagus demonstrate no significant findings. Lungs/Pleura: No pleural effusion or pneumothorax. Pulmonary assessment mildly limited by breathing artifact. There are multifocal patchy nodular airspace opacities within the left upper lobe, lingula and anterior left lower lobe consistent with multilobar pneumonia. The right lung is clear. Upper abdomen:  No acute findings.  Probable mild hepatic steatosis. Musculoskeletal/Chest wall: There is no chest wall mass or suspicious osseous finding. Review of the MIP images confirms the above findings. IMPRESSION: 1. No evidence of acute pulmonary embolism or other acute vascular findings in the chest. 2. Multifocal patchy nodular airspace opacities within the left upper lobe, lingula and anterior left lower lobe consistent with multilobar pneumonia. Radiographic follow-up recommended to ensure resolution. 3. Probable mild hepatic steatosis. Electronically Signed   By: Elsie Perone M.D.   On: 02/02/2024 17:23   DG Chest Portable 1 View Result Date: 02/02/2024 CLINICAL DATA:  Shortness of breath and fever. EXAM:  PORTABLE CHEST 1 VIEW COMPARISON:  Chest x-ray dated November 09, 2023. FINDINGS: The patient is rotated to the right. The heart size and mediastinal contours are within normal limits. Normal pulmonary vascularity. Hazy density at the right lung base is felt to be overlapping soft tissue. No focal consolidation, pleural effusion, or pneumothorax. No acute osseous abnormality. IMPRESSION: 1. No active disease. Electronically Signed   By: Elsie ONEIDA Shoulder M.D.   On: 02/02/2024 14:11  LOS: 1 day   Time spent= 35 mins    Burgess JAYSON Dare, MD [facility]  If 7PM-7AM, please contact night-coverage  02/04/2024, 12:33 PM

## 2024-02-04 NOTE — Progress Notes (Signed)
 Pt was transferred from ED to [id number]. He is alert and oriented x 4, no fever, no chills, HR 125, BP stable, on 6 LPM of O2 NCL, RR 24-26, SPO2 94-95% with presenting of shortness of breath with exertion. He is able to ambulate independently short distance in his room. His is in yellow MEWS. Vital sings will be frequently recheck per protocol.   He has complaint of headache and chest pain possibly pleuritic chest pain from pneumonia, pain scale 5/10. We notify the on-call providers, Dr.Abigail And Dr. Sim. Pt is able to rest after Toradol  30 mg IV given. We will continue to monitor.  Wendi Dash, RN

## 2024-02-04 NOTE — TOC Initial Note (Signed)
 Transition of Care [facility]) - Initial/Assessment Note    Patient Details  Name: Joseph Morales MRN: [id number] Date of Birth: 07-30-1971  [facility]) CM/SW Contact:    Luann SHAUNNA Cumming, LCSW Phone Number: 02/04/2024, 1:04 PM  Clinical Narrative:                  MD notified CSW that pt is having trouble with O2 tubing at home. CSW met with pt who states he receives O2 from [employer] and wears 4L at baseline. Pt explains he received extended tubings but that they are old and do not flow well. Family will provide transportation at time of DC though pt will need an O2 tank delivered to room to get home.   CSW called [employer] and request tank delivery and that tubing at home be addressed.   Expected Discharge Plan: Home/Self Care Barriers to Discharge: Continued Medical Work up   Patient Goals and CMS Choice   CMS Medicare.gov Compare Post Acute Care list provided to:: Patient Choice offered to / list presented to : Patient      Expected Discharge Plan and Services       Living arrangements for the past 2 months: Single Family Home                                      Prior Living Arrangements/Services Living arrangements for the past 2 months: Single Family Home   Patient language and need for interpreter reviewed:: Yes        Need for Family Participation in Patient Care: No (Comment) Care giver support system in place?: Yes (comment) Current home services: DME Criminal Activity/Legal Involvement Pertinent to Current Situation/Hospitalization: No - Comment as needed  Activities of Daily Living   ADL Screening (condition at time of admission) Independently performs ADLs?: Yes (appropriate for developmental age) Is the patient deaf or have difficulty hearing?: No Does the patient have difficulty seeing, even when wearing glasses/contacts?: No Does the patient have difficulty concentrating, remembering, or making decisions?: No  Permission Sought/Granted                   Emotional Assessment Appearance:: Appears stated age Attitude/Demeanor/Rapport: Engaged Affect (typically observed): Accepting Orientation: : Oriented to Self, Oriented to Place, Oriented to  Time, Oriented to Situation Alcohol / Substance Use: Not Applicable Psych Involvement: No (comment)  Admission diagnosis:  CAP (community acquired pneumonia) [J18.9] Severe sepsis (HCC) [A41.9, R65.20] Patient Active Problem List   Diagnosis Date Noted   Acute on chronic respiratory failure with hypoxia and hypercapnia (HCC) 05/30/2022   Transaminitis 05/30/2022   CAP (community acquired pneumonia) 05/30/2022   COPD with acute exacerbation (HCC) 05/30/2022   Acute respiratory failure with hypoxia and hypercarbia (HCC) 12/24/2021   Influenza A 12/24/2021   Acute respiratory failure with hypoxia and hypercapnia (HCC) 12/24/2021   Hypercapnic respiratory failure (HCC) 05/18/2021   Erectile dysfunction 02/22/2021   Tobacco abuse 10/24/2020   Chronic respiratory failure with hypoxia and hypercapnia (HCC) 10/16/2020   Essential hypertension 10/16/2020   Hyperlipidemia 10/16/2020   Asthma 06/21/2020   Acute on chronic diastolic CHF (congestive heart failure) (HCC) 06/21/2020   Orthopnea 06/21/2020   Chronic venous insufficiency 06/21/2020   Cellulitis 06/21/2020   Obesity, Class III, BMI 40-49.9 (morbid obesity) (HCC) 06/21/2020   Obesity hypoventilation syndrome (HCC) 06/21/2020   Leucocytosis 06/21/2020   Chronic respiratory alkalosis 06/21/2020  Hypertensive urgency 06/21/2020   Calf tenderness 06/21/2020   PCP:  Hollowell, Isaiah Fellows, MD Pharmacy:   [facility] [phone] - [address], [place] - [address] AT [facility] [address] [place] [address] Phone: [phone] Fax: [phone]     Social Drivers of Health (SDOH) Social History: SDOH Screenings   Food Insecurity: No Food Insecurity (02/03/2024)  Housing: Low Risk  (02/03/2024)   Transportation Needs: No Transportation Needs (02/03/2024)  Utilities: Not At Risk (02/03/2024)  Tobacco Use: Medium Risk (02/03/2024)   SDOH Interventions:     Readmission Risk Interventions     No data to display

## 2024-02-04 NOTE — Hospital Course (Addendum)
 Brief Narrative:  53 year old with history of COPD, asthma, diastolic CHF, obesity hypoventilation syndrome, HTN came to the ER with complaints of shortness of breath and cough.  Chronically uses 4 L of nasal cannula at home and in the ER requiring 15 L.  Chest x-ray showed left upper and lower lobe pneumonia.  Had signs of sepsis and started on Rocephin  and azithromycin .   Assessment & Plan:  Principal Problem:   CAP (community acquired pneumonia) Active Problems:   Asthma   Acute on chronic diastolic CHF (congestive heart failure) (HCC)   Chronic venous insufficiency   Obesity, Class III, BMI 40-49.9 (morbid obesity) (HCC)   Chronic respiratory failure with hypoxia and hypercapnia (HCC)   Essential hypertension   Hyperlipidemia   Acute respiratory distress secondary to community-acquired pneumonia Sepsis secondary to community-acquired pneumonia Acute on chronic hypoxia -At home uses 4 L nasal cannula, currently on 15 L high flow.  COVID/RSV/flu is negative.  Will check respiratory panel.  Bronchodilators, I-S/flutter valve.  Out of bed to chair.  Procalcitonin elevated, continue antibiotics.  Mildly elevated BNP as well.  Acute on chronic diastolic CHF - Very mild signs of volume overload, will give 1 dose of Lasix  80 mg IV followed by his home p.o.  Continue home Coreg , Lasix , losartan .  Obesity hypoventilation syndrome -Supportive care and supplemental oxygen  Hyponatremia -Resolved  Leukocytosis - Chronically elevated WBC greater than 25.  This morning 48.  I am not sure if patient is following outpatient with any provider for this.  I do not see any workup in our system in Care Everywhere.  Once he is over this acute phase, he should follow-up with outpatient hematology to workup his chronic leukocytosis   Essential hypertension -Coreg , losartan , Lasix  p.o. twice daily.  IV as needed    Diabetes mellitus type 2 -This is per history.  Will check A1c.  Sliding scale and  Accu-Cheks  Chronic venous insufficiency -Supportive care and elevate legs  Depression/anxiety - On Wellbutrin     DVT prophylaxis: Lovenox      Code Status: Full Code Family Communication:   Status is: Inpatient Remains inpatient appropriate because: Continue hospital stay for management of hypoxia    Subjective: Seen at bed side, tells me he feels a little better compared to yesterday.   Examination:  General exam: Appears calm and comfortable, morbidly obese Respiratory system: Clear to auscultation. Respiratory effort normal. Cardiovascular system: S1 & S2 heard, RRR. No JVD, murmurs, rubs, gallops or clicks. No pedal edema. Gastrointestinal system: Abdomen is nondistended, soft and nontender. No organomegaly or masses felt. Normal bowel sounds heard. Central nervous system: Alert and oriented. No focal neurological deficits. Extremities: Symmetric 5 x 5 power. Skin: No rashes, lesions or ulcers Psychiatry: Judgement and insight appear normal. Mood & affect appropriate.

## 2024-02-04 NOTE — Plan of Care (Signed)

## 2024-02-05 DIAGNOSIS — J189 Pneumonia, unspecified organism: Secondary | ICD-10-CM | POA: Diagnosis not present

## 2024-02-05 LAB — BASIC METABOLIC PANEL
Anion gap: 10 (ref 5–15)
BUN: 20 mg/dL (ref 6–20)
CO2: 33 mmol/L — ABNORMAL HIGH (ref 22–32)
Calcium: 8.4 mg/dL — ABNORMAL LOW (ref 8.9–10.3)
Chloride: 92 mmol/L — ABNORMAL LOW (ref 98–111)
Creatinine, Ser: 0.99 mg/dL (ref 0.61–1.24)
GFR, Estimated: >60 mL/min (ref >60)
Glucose, Bld: 162 mg/dL — ABNORMAL HIGH (ref 70–99)
Potassium: 4.2 mmol/L (ref 3.5–5.1)
Sodium: 135 mmol/L (ref 135–145)

## 2024-02-05 LAB — CBC
HCT: 42.3 % (ref 39.0–52.0)
Hemoglobin: 12.6 g/dL — ABNORMAL LOW (ref 13.0–17.0)
MCH: 28.5 pg (ref 26.0–34.0)
MCHC: 29.8 g/dL — ABNORMAL LOW (ref 30.0–36.0)
MCV: 95.7 fL (ref 80.0–100.0)
Platelets: 250 10*3/uL (ref 150–400)
RBC: 4.42 MIL/uL (ref 4.22–5.81)
RDW: 12.9 % (ref 11.5–15.5)
WBC: 37 10*3/uL — ABNORMAL HIGH (ref 4.0–10.5)
nRBC: 0 % (ref 0.0–0.2)

## 2024-02-05 LAB — MAGNESIUM: Magnesium: 2.2 mg/dL (ref 1.7–2.4)

## 2024-02-05 LAB — PHOSPHORUS: Phosphorus: 2.6 mg/dL (ref 2.5–4.6)

## 2024-02-05 MED ORDER — IPRATROPIUM-ALBUTEROL 0.5-2.5 (3) MG/3ML IN SOLN: 3.0000 mL | RESPIRATORY_TRACT | Status: DC | PRN

## 2024-02-05 NOTE — Plan of Care (Signed)

## 2024-02-05 NOTE — Progress Notes (Signed)
 PROGRESS NOTE    Joseph Morales  FMW:[id number] DOB: 01-Aug-1971 DOA: 02/03/2024 PCP: Corinthia Isaiah Fellows, MD    Brief Narrative:  53 year old with history of COPD, asthma, diastolic CHF, obesity hypoventilation syndrome, HTN came to the ER with complaints of shortness of breath and cough.  Chronically uses 4 L of nasal cannula at home and in the ER requiring 15 L.  Chest x-ray showed left upper and lower lobe pneumonia.  Had signs of sepsis and started on Rocephin  and azithromycin .     Assessment & Plan:  Principal Problem:   CAP (community acquired pneumonia) Active Problems:   Asthma   Acute on chronic diastolic CHF (congestive heart failure) (HCC)   Chronic venous insufficiency   Obesity, Class III, BMI 40-49.9 (morbid obesity) (HCC)   Chronic respiratory failure with hypoxia and hypercapnia (HCC)   Essential hypertension   Hyperlipidemia   Acute respiratory distress secondary to community-acquired pneumonia Sepsis secondary to community-acquired pneumonia Acute on chronic hypoxia -At home uses 4 L nasal cannula, currently on 6L O2.   COVID/RSV/flu is negative.  Respiratory panel negative.  - Treating as CAP and subsequent COPD exacerbation.  Bronchodilators, I-S/flutter valve.  Out of bed to chair.  Procalcitonin elevated, continue antibiotics.  Mildly elevated BNP as well. - on azithro/CTX regimen.  Will narrow prior to DC   Acute on chronic diastolic CHF - Very mild signs of volume overload, will give 1 dose of Lasix  80 mg IV followed by his home p.o.  Continue home Coreg , Lasix , losartan . - better today.    Obesity hypoventilation syndrome -Supportive care and supplemental oxygen   Hyponatremia -Resolved   Leukocytosis - Chronically elevated WBC greater than 25.  This morning 48.  I am not sure if patient is following outpatient with any provider for this.  I do not see any workup in our system in Care Everywhere.  Once he is over this acute phase, he should  follow-up with outpatient hematology to workup his chronic leukocytosis    Essential hypertension -Coreg , losartan , Lasix  p.o. twice daily.  IV as needed - BP good here.     Diabetes mellitus type 2 -This is per history.  Will check A1c.  Sliding scale and Accu-Cheks   Chronic venous insufficiency -Supportive care and elevate legs   Depression/anxiety - On Wellbutrin     DVT prophylaxis: Lovenox       Code Status: Full Code Family Communication:   Status is: Inpatient Remains inpatient appropriate because: Continue hospital stay for management of hypoxia   Subjective: Seen at bed side, tells me he feels a little better compared to yesterday.  Still with notable cough.  Eating and drinking well.     Examination:   General exam: Appears calm and comfortable, morbidly obese Respiratory system: Diffuse wheezing noted bilateral bases.  Does have good air movement no increased work of breathing Cardiovascular system: S1 & S2 heard, RRR. No JVD, murmurs, rubs, gallops or clicks. No pedal edema. Gastrointestinal system: Abdomen is nondistended, soft and nontender. No organomegaly or masses felt. Normal bowel sounds heard. Central nervous system: Alert and oriented. No focal neurological deficits. Extremities: Symmetric 5 x 5 power. Skin: No rashes, lesions or ulcers Psychiatry: Judgement and insight appear normal. Mood & affect appropriate.             Diet Orders (From admission, onward)     Start     Ordered   02/03/24 2220  Diet Heart Room service appropriate? Yes; Fluid consistency: Thin  Diet  effective now       Question Answer Comment  Room service appropriate? Yes   Fluid consistency: Thin      02/03/24 2219            Objective: Vitals:   02/04/24 2131 02/05/24 0855 02/05/24 0955 02/05/24 1504  BP: 126/62  119/71   Pulse: (!) 103  89   Resp:   20   Temp:   97.9 F (36.6 C)   TempSrc:   Oral   SpO2:  94% 98% 92%  Weight:      Height:         Intake/Output Summary (Last 24 hours) at 02/05/2024 1528 Last data filed at 02/05/2024 0815 Gross per 24 hour  Intake 658.67 ml  Output 2425 ml  Net -1766.33 ml   Filed Weights   02/03/24 1707  Weight: (!) 176.6 kg    Scheduled Meds:  buPROPion   300 mg Oral Daily   carvedilol   6.25 mg Oral BID   enoxaparin  (LOVENOX ) injection  80 mg Subcutaneous Q24H   fluticasone   1 spray Each Nare Daily   furosemide   80 mg Oral BID   ipratropium-albuterol   3 mL Nebulization Q6H   losartan   25 mg Oral Daily   Continuous Infusions:  sodium chloride  10 mL/hr at 02/04/24 1108   azithromycin  500 mg (02/04/24 1700)   cefTRIAXone  (ROCEPHIN )  IV 2 g (02/04/24 1824)    Nutritional status     Body mass index is 48.66 kg/m.  Data Reviewed:   CBC: Recent Labs  Lab 02/02/24 1314 02/03/24 1620 02/04/24 0448 02/05/24 0427  WBC 28.2* 45.5* 48.0* 37.0*  NEUTROABS 24.4* 37.1*  --   --   HGB 14.3 13.9 13.7 12.6*  HCT 46.5 44.6 45.2 42.3  MCV 93.2 92.9 94.6 95.7  PLT 247 244 256 250   Basic Metabolic Panel: Recent Labs  Lab 02/02/24 1314 02/03/24 1620 02/04/24 0448 02/05/24 0427  NA 136 133* 135 135  K 4.1 3.8 4.4 4.2  CL 97* 97* 94* 92*  CO2 31 28 31  33*  GLUCOSE 180* 165* 136* 162*  BUN 15 17 17 20   CREATININE 1.08 1.18 1.15 0.99  CALCIUM  8.8* 8.2* 8.4* 8.4*  MG 1.6*  --   --  2.2  PHOS  --   --   --  2.6   GFR: Estimated Creatinine Clearance: 153.2 mL/min (by C-G formula based on SCr of 0.99 mg/dL). Liver Function Tests: Recent Labs  Lab 02/02/24 1314 02/03/24 1620 02/04/24 0448  AST 24 24 25   ALT 22 25 31   ALKPHOS 68 61 69  BILITOT 1.7* 2.1* 2.1*  PROT 8.5* 7.6 8.0  ALBUMIN 4.0 3.3* 3.3*   No results for input(s): LIPASE, AMYLASE in the last 168 hours. No results for input(s): AMMONIA in the last 168 hours. Coagulation Profile: Recent Labs  Lab 02/03/24 1620  INR 1.1   Cardiac Enzymes: No results for input(s): CKTOTAL, CKMB, CKMBINDEX,  TROPONINI in the last 168 hours. BNP (last 3 results) No results for input(s): PROBNP in the last 8760 hours. HbA1C: No results for input(s): HGBA1C in the last 72 hours. CBG: No results for input(s): GLUCAP in the last 168 hours. Lipid Profile: No results for input(s): CHOL, HDL, LDLCALC, TRIG, CHOLHDL, LDLDIRECT in the last 72 hours. Thyroid Function Tests: No results for input(s): TSH, T4TOTAL, FREET4, T3FREE, THYROIDAB in the last 72 hours. Anemia Panel: No results for input(s): VITAMINB12, FOLATE, FERRITIN, TIBC, IRON, RETICCTPCT in the last 72  hours. Sepsis Labs: Recent Labs  Lab 02/03/24 1626 02/04/24 0120 02/04/24 0448  PROCALCITON  --   --  3.03  LATICACIDVEN 1.0 1.3 0.8    Recent Results (from the past 240 hours)  Resp panel by RT-PCR (RSV, Flu A&B, Covid) Anterior Nasal Swab     Status: None   Collection Time: 02/02/24 12:44 PM   Specimen: Anterior Nasal Swab  Result Value Ref Range Status   SARS Coronavirus 2 by RT PCR NEGATIVE NEGATIVE Final    Comment: (NOTE) SARS-CoV-2 target nucleic acids are NOT DETECTED.  The SARS-CoV-2 RNA is generally detectable in upper respiratory specimens during the acute phase of infection. The lowest concentration of SARS-CoV-2 viral copies this assay can detect is 138 copies/mL. A negative result does not preclude SARS-Cov-2 infection and should not be used as the sole basis for treatment or other patient management decisions. A negative result may occur with  improper specimen collection/handling, submission of specimen other than nasopharyngeal swab, presence of viral mutation(s) within the areas targeted by this assay, and inadequate number of viral copies(<138 copies/mL). A negative result must be combined with clinical observations, patient history, and epidemiological information. The expected result is Negative.  Fact Sheet for Patients:   bloggercourse.com  Fact Sheet for Healthcare Providers:  seriousbroker.it  This test is no t yet approved or cleared by the [place]  FDA and  has been authorized for detection and/or diagnosis of SARS-CoV-2 by FDA under an Emergency Use Authorization (EUA). This EUA will remain  in effect (meaning this test can be used) for the duration of the COVID-19 declaration under Section 564(b)(1) of the Act, 21 U.S.C.section 360bbb-3(b)(1), unless the authorization is terminated  or revoked sooner.       Influenza A by PCR NEGATIVE NEGATIVE Final   Influenza B by PCR NEGATIVE NEGATIVE Final    Comment: (NOTE) The Xpert Xpress SARS-CoV-2/FLU/RSV plus assay is intended as an aid in the diagnosis of influenza from Nasopharyngeal swab specimens and should not be used as a sole basis for treatment. Nasal washings and aspirates are unacceptable for Xpert Xpress SARS-CoV-2/FLU/RSV testing.  Fact Sheet for Patients: bloggercourse.com  Fact Sheet for Healthcare Providers: seriousbroker.it  This test is not yet approved or cleared by the [place]  FDA and has been authorized for detection and/or diagnosis of SARS-CoV-2 by FDA under an Emergency Use Authorization (EUA). This EUA will remain in effect (meaning this test can be used) for the duration of the COVID-19 declaration under Section 564(b)(1) of the Act, 21 U.S.C. section 360bbb-3(b)(1), unless the authorization is terminated or revoked.     Resp Syncytial Virus by PCR NEGATIVE NEGATIVE Final    Comment: (NOTE) Fact Sheet for Patients: bloggercourse.com  Fact Sheet for Healthcare Providers: seriousbroker.it  This test is not yet approved or cleared by the [place]  FDA and has been authorized for detection and/or diagnosis of SARS-CoV-2 by FDA under an Emergency Use  Authorization (EUA). This EUA will remain in effect (meaning this test can be used) for the duration of the COVID-19 declaration under Section 564(b)(1) of the Act, 21 U.S.C. section 360bbb-3(b)(1), unless the authorization is terminated or revoked.  Performed at [facility], [address] [place], [place] [address]   Blood Culture (routine x 2)     Status: None (Preliminary result)   Collection Time: 02/03/24  4:15 PM   Specimen: BLOOD  Result Value Ref Range Status   Specimen Description   Final    BLOOD  SITE NOT SPECIFIED Performed at [facility], [address] [place], [place] [address]    Special Requests   Final    BOTTLES DRAWN AEROBIC AND ANAEROBIC Blood Culture adequate volume Performed at [facility], [address] [place], [place] [address]    Culture   Final    NO GROWTH 2 DAYS Performed at [facility], [address], [place], [place] [address]    Report Status PENDING  Incomplete  Resp panel by RT-PCR (RSV, Flu A&B, Covid) Anterior Nasal Swab     Status: None   Collection Time: 02/03/24  4:20 PM   Specimen: Anterior Nasal Swab  Result Value Ref Range Status   SARS Coronavirus 2 by RT PCR NEGATIVE NEGATIVE Final    Comment: (NOTE) SARS-CoV-2 target nucleic acids are NOT DETECTED.  The SARS-CoV-2 RNA is generally detectable in upper respiratory specimens during the acute phase of infection. The lowest concentration of SARS-CoV-2 viral copies this assay can detect is 138 copies/mL. A negative result does not preclude SARS-Cov-2 infection and should not be used as the sole basis for treatment or other patient management decisions. A negative result may occur with  improper specimen collection/handling, submission of specimen other than nasopharyngeal swab, presence of viral mutation(s) within the areas targeted by this assay, and inadequate number of viral copies(<138 copies/mL). A negative  result must be combined with clinical observations, patient history, and epidemiological information. The expected result is Negative.  Fact Sheet for Patients:  bloggercourse.com  Fact Sheet for Healthcare Providers:  seriousbroker.it  This test is no t yet approved or cleared by the [place]  FDA and  has been authorized for detection and/or diagnosis of SARS-CoV-2 by FDA under an Emergency Use Authorization (EUA). This EUA will remain  in effect (meaning this test can be used) for the duration of the COVID-19 declaration under Section 564(b)(1) of the Act, 21 U.S.C.section 360bbb-3(b)(1), unless the authorization is terminated  or revoked sooner.       Influenza A by PCR NEGATIVE NEGATIVE Final   Influenza B by PCR NEGATIVE NEGATIVE Final    Comment: (NOTE) The Xpert Xpress SARS-CoV-2/FLU/RSV plus assay is intended as an aid in the diagnosis of influenza from Nasopharyngeal swab specimens and should not be used as a sole basis for treatment. Nasal washings and aspirates are unacceptable for Xpert Xpress SARS-CoV-2/FLU/RSV testing.  Fact Sheet for Patients: bloggercourse.com  Fact Sheet for Healthcare Providers: seriousbroker.it  This test is not yet approved or cleared by the [place]  FDA and has been authorized for detection and/or diagnosis of SARS-CoV-2 by FDA under an Emergency Use Authorization (EUA). This EUA will remain in effect (meaning this test can be used) for the duration of the COVID-19 declaration under Section 564(b)(1) of the Act, 21 U.S.C. section 360bbb-3(b)(1), unless the authorization is terminated or revoked.     Resp Syncytial Virus by PCR NEGATIVE NEGATIVE Final    Comment: (NOTE) Fact Sheet for Patients: bloggercourse.com  Fact Sheet for Healthcare Providers: seriousbroker.it  This  test is not yet approved or cleared by the [place]  FDA and has been authorized for detection and/or diagnosis of SARS-CoV-2 by FDA under an Emergency Use Authorization (EUA). This EUA will remain in effect (meaning this test can be used) for the duration of the COVID-19 declaration under Section 564(b)(1) of the Act, 21 U.S.C. section 360bbb-3(b)(1), unless the authorization is terminated or revoked.  Performed at [facility], [address] [place], [place] [address]   Blood Culture (routine x 2)     Status: None (Preliminary result)   Collection Time: 02/04/24  1:20 AM   Specimen: BLOOD  Result Value Ref Range Status   Specimen Description   Final    BLOOD BLOOD RIGHT ARM Performed at [facility], [address] [place], [place] [address]    Special Requests   Final    BOTTLES DRAWN AEROBIC ONLY Blood Culture adequate volume Performed at [facility], [address], [place], [place] [address]    Culture   Final    NO GROWTH 1 DAY Performed at [facility], [address] [place], [place] [address]    Report Status PENDING  Incomplete  Respiratory (~20 pathogens) panel by PCR     Status: None   Collection Time: 02/04/24  8:34 AM   Specimen: Nasopharyngeal Swab; Respiratory  Result Value Ref Range Status   Adenovirus NOT DETECTED NOT DETECTED Final   Coronavirus 229E NOT DETECTED NOT DETECTED Final    Comment: (NOTE) The Coronavirus on the Respiratory Panel, DOES NOT test for the novel  Coronavirus (2019 nCoV)    Coronavirus HKU1 NOT DETECTED NOT DETECTED Final   Coronavirus NL63 NOT DETECTED NOT DETECTED Final   Coronavirus OC43 NOT DETECTED NOT DETECTED Final   Metapneumovirus NOT DETECTED NOT DETECTED Final   Rhinovirus / Enterovirus NOT DETECTED NOT DETECTED Final   Influenza A NOT DETECTED NOT DETECTED Final   Influenza B NOT DETECTED NOT DETECTED Final   Parainfluenza Virus 1 NOT DETECTED NOT  DETECTED Final   Parainfluenza Virus 2 NOT DETECTED NOT DETECTED Final   Parainfluenza Virus 3 NOT DETECTED NOT DETECTED Final   Parainfluenza Virus 4 NOT DETECTED NOT DETECTED Final   Respiratory Syncytial Virus NOT DETECTED NOT DETECTED Final   Bordetella pertussis NOT DETECTED NOT DETECTED Final   Bordetella Parapertussis NOT DETECTED NOT DETECTED Final   Chlamydophila pneumoniae NOT DETECTED NOT DETECTED Final   Mycoplasma pneumoniae NOT DETECTED NOT DETECTED Final    Comment: Performed at [facility], [address] [place], [place] [address]         Radiology Studies: DG Chest Port 1 View Result Date: 02/03/2024 CLINICAL DATA:  Sepsis. EXAM: PORTABLE CHEST 1 VIEW COMPARISON:  Chest radiograph dated 02/02/2024 and CT dated 02/02/2024. FINDINGS: Areas of airspace opacity in the left upper and left lower lobes, progressed since the prior radiograph. No pleural effusion or pneumothorax. The cardiac silhouette is within normal limits. No acute osseous pathology. IMPRESSION: Left upper and left lower lobe pneumonia. Electronically Signed   By: Vanetta Chou M.D.   On: 02/03/2024 18:03           LOS: 2 days   Time spent= 35 mins    Reyes VEAR Gaw, MD [facility]  If 7PM-7AM, please contact night-coverage  02/05/2024, 3:28 PM

## 2024-02-06 DIAGNOSIS — J189 Pneumonia, unspecified organism: Secondary | ICD-10-CM | POA: Diagnosis not present

## 2024-02-06 LAB — CBC
HCT: 44.8 % (ref 39.0–52.0)
Hemoglobin: 13.5 g/dL (ref 13.0–17.0)
MCH: 29.1 pg (ref 26.0–34.0)
MCHC: 30.1 g/dL (ref 30.0–36.0)
MCV: 96.6 fL (ref 80.0–100.0)
Platelets: 268 10*3/uL (ref 150–400)
RBC: 4.64 MIL/uL (ref 4.22–5.81)
RDW: 12.8 % (ref 11.5–15.5)
WBC: 27.6 10*3/uL — ABNORMAL HIGH (ref 4.0–10.5)
nRBC: 0 % (ref 0.0–0.2)

## 2024-02-06 LAB — BASIC METABOLIC PANEL
Anion gap: 11 (ref 5–15)
BUN: 16 mg/dL (ref 6–20)
CO2: 38 mmol/L — ABNORMAL HIGH (ref 22–32)
Calcium: 8.8 mg/dL — ABNORMAL LOW (ref 8.9–10.3)
Chloride: 87 mmol/L — ABNORMAL LOW (ref 98–111)
Creatinine, Ser: 0.95 mg/dL (ref 0.61–1.24)
GFR, Estimated: >60 mL/min (ref >60)
Glucose, Bld: 149 mg/dL — ABNORMAL HIGH (ref 70–99)
Potassium: 3.7 mmol/L (ref 3.5–5.1)
Sodium: 136 mmol/L (ref 135–145)

## 2024-02-06 NOTE — Progress Notes (Signed)
 Messaged MD RT is recommending new chest xray due to patient is not moving and increased limited air flow

## 2024-02-06 NOTE — Progress Notes (Signed)
 PROGRESS NOTE    Joseph Morales  FMW:[id number] DOB: 1971-07-02 DOA: 02/03/2024 PCP: Corinthia Isaiah Fellows, MD    Brief Narrative:  53 year old with history of COPD, asthma, diastolic CHF, obesity hypoventilation syndrome, HTN came to the ER with complaints of shortness of breath and cough.  Chronically uses 4 L of nasal cannula at home and in the ER requiring 15 L.  Chest x-ray showed left upper and lower lobe pneumonia.  Had signs of sepsis and started on Rocephin  and azithromycin .   Assessment & Plan:  Principal Problem:   CAP (community acquired pneumonia) Active Problems:   Asthma   Acute on chronic diastolic CHF (congestive heart failure) (HCC)   Chronic venous insufficiency   Obesity, Class III, BMI 40-49.9 (morbid obesity) (HCC)   Chronic respiratory failure with hypoxia and hypercapnia (HCC)   Essential hypertension   Hyperlipidemia   Acute respiratory distress secondary to community-acquired pneumonia Sepsis secondary to community-acquired pneumonia Acute on chronic hypoxia -At home uses 4 L nasal cannula, currently on 5L O2.  Still not moving air very well. - COVID/RSV/flu is negative.  Respiratory panel negative.  - Treating as CAP and subsequent COPD exacerbation.  Bronchodilators, I-S/flutter valve.  Out of bed to chair.  Procalcitonin elevated, continue antibiotics.  Mildly elevated BNP as well. - on azithro/CTX regimen.  Will narrow prior to DC.  Started on 2/6.  Day 5 will be 2/10 -Will also recheck x-ray and obtain 2 view to assess for improvement in pneumonia and because he has not had much improvement in air movement thus far   Acute on chronic diastolic CHF - Very mild signs of volume overload, will give 1 dose of Lasix  80 mg IV followed by his home p.o.  Continue home Coreg , Lasix , losartan . - better today.    Obesity hypoventilation syndrome -Supportive care and supplemental oxygen - CPAP ordered for tonight.  Uses this on an outpatient basis.    Hyponatremia -Resolved   Leukocytosis - Chronically elevated WBC greater than 25.  This morning 48.  I am not sure if patient is following outpatient with any provider for this.  I do not see any workup in our system in Care Everywhere.  Once he is over this acute phase, he should follow-up with outpatient hematology to workup his chronic leukocytosis    Essential hypertension -Coreg , losartan , Lasix  p.o. twice daily.  IV as needed - BP good here.     Diabetes mellitus type 2 -This is per history.  Will check A1c.  Sliding scale and Accu-Cheks   Chronic venous insufficiency -Supportive care and elevate legs   Depression/anxiety - On Wellbutrin     DVT prophylaxis: Lovenox       Code Status: Full Code Family Communication:   Status is: Inpatient Remains inpatient appropriate because: Continue hospital stay for management of hypoxia   Subjective: Seen at bed side, tells me he feels a little better compared to yesterday.  Still with notable cough and not great air movement.  Eating and drinking well.     Examination:   General exam: Appears calm and comfortable, morbidly obese Respiratory system: Diffuse wheezing noted bilateral bases.  Does have only fair amount of air movement bilateral bases. Cardiovascular system: S1 & S2 heard, RRR. No JVD, murmurs, rubs, gallops or clicks. No pedal edema. Gastrointestinal system: Abdomen is nondistended, soft and nontender. No organomegaly or masses felt. Normal bowel sounds heard. Central nervous system: Alert and oriented. No focal neurological deficits. Extremities: Symmetric 5 x 5 power. Skin:  No rashes, lesions or ulcers Psychiatry: Judgement and insight appear normal. Mood & affect appropriate.             Diet Orders (From admission, onward)     Start     Ordered   02/03/24 2220  Diet Heart Room service appropriate? Yes; Fluid consistency: Thin  Diet effective now       Question Answer Comment  Room service appropriate?  Yes   Fluid consistency: Thin      02/03/24 2219            Objective: Vitals:   02/06/24 0233 02/06/24 0451 02/06/24 0917 02/06/24 1143  BP:  129/76  115/61  Pulse:  92  92  Resp:  18  16  Temp:  98.2 F (36.8 C)  98.1 F (36.7 C)  TempSrc:      SpO2: 90% 95% 93% 91%  Weight:      Height:        Intake/Output Summary (Last 24 hours) at 02/06/2024 1514 Last data filed at 02/06/2024 1500 Gross per 24 hour  Intake 840 ml  Output 1400 ml  Net -560 ml   Filed Weights   02/03/24 1707  Weight: (!) 176.6 kg    Scheduled Meds:  buPROPion   300 mg Oral Daily   carvedilol   6.25 mg Oral BID   enoxaparin  (LOVENOX ) injection  80 mg Subcutaneous Q24H   fluticasone   1 spray Each Nare Daily   furosemide   80 mg Oral BID   ipratropium-albuterol   3 mL Nebulization Q6H   losartan   25 mg Oral Daily   Continuous Infusions:  sodium chloride  10 mL/hr at 02/04/24 1108   azithromycin  500 mg (02/05/24 1752)   cefTRIAXone  (ROCEPHIN )  IV 2 g (02/05/24 1712)    Nutritional status     Body mass index is 48.66 kg/m.  Data Reviewed:   CBC: Recent Labs  Lab 02/02/24 1314 02/03/24 1620 02/04/24 0448 02/05/24 0427 02/06/24 0508  WBC 28.2* 45.5* 48.0* 37.0* 27.6*  NEUTROABS 24.4* 37.1*  --   --   --   HGB 14.3 13.9 13.7 12.6* 13.5  HCT 46.5 44.6 45.2 42.3 44.8  MCV 93.2 92.9 94.6 95.7 96.6  PLT 247 244 256 250 268   Basic Metabolic Panel: Recent Labs  Lab 02/02/24 1314 02/03/24 1620 02/04/24 0448 02/05/24 0427 02/06/24 0508  NA 136 133* 135 135 136  K 4.1 3.8 4.4 4.2 3.7  CL 97* 97* 94* 92* 87*  CO2 31 28 31  33* 38*  GLUCOSE 180* 165* 136* 162* 149*  BUN 15 17 17 20 16   CREATININE 1.08 1.18 1.15 0.99 0.95  CALCIUM  8.8* 8.2* 8.4* 8.4* 8.8*  MG 1.6*  --   --  2.2  --   PHOS  --   --   --  2.6  --    GFR: Estimated Creatinine Clearance: 159.6 mL/min (by C-G formula based on SCr of 0.95 mg/dL). Liver Function Tests: Recent Labs  Lab 02/02/24 1314 02/03/24 1620  02/04/24 0448  AST 24 24 25   ALT 22 25 31   ALKPHOS 68 61 69  BILITOT 1.7* 2.1* 2.1*  PROT 8.5* 7.6 8.0  ALBUMIN 4.0 3.3* 3.3*   No results for input(s): LIPASE, AMYLASE in the last 168 hours. No results for input(s): AMMONIA in the last 168 hours. Coagulation Profile: Recent Labs  Lab 02/03/24 1620  INR 1.1   Cardiac Enzymes: No results for input(s): CKTOTAL, CKMB, CKMBINDEX, TROPONINI in the last 168 hours.  BNP (last 3 results) No results for input(s): PROBNP in the last 8760 hours. HbA1C: No results for input(s): HGBA1C in the last 72 hours. CBG: No results for input(s): GLUCAP in the last 168 hours. Lipid Profile: No results for input(s): CHOL, HDL, LDLCALC, TRIG, CHOLHDL, LDLDIRECT in the last 72 hours. Thyroid Function Tests: No results for input(s): TSH, T4TOTAL, FREET4, T3FREE, THYROIDAB in the last 72 hours. Anemia Panel: No results for input(s): VITAMINB12, FOLATE, FERRITIN, TIBC, IRON, RETICCTPCT in the last 72 hours. Sepsis Labs: Recent Labs  Lab 02/03/24 1626 02/04/24 0120 02/04/24 0448  PROCALCITON  --   --  3.03  LATICACIDVEN 1.0 1.3 0.8    Recent Results (from the past 240 hours)  Resp panel by RT-PCR (RSV, Flu A&B, Covid) Anterior Nasal Swab     Status: None   Collection Time: 02/02/24 12:44 PM   Specimen: Anterior Nasal Swab  Result Value Ref Range Status   SARS Coronavirus 2 by RT PCR NEGATIVE NEGATIVE Final    Comment: (NOTE) SARS-CoV-2 target nucleic acids are NOT DETECTED.  The SARS-CoV-2 RNA is generally detectable in upper respiratory specimens during the acute phase of infection. The lowest concentration of SARS-CoV-2 viral copies this assay can detect is 138 copies/mL. A negative result does not preclude SARS-Cov-2 infection and should not be used as the sole basis for treatment or other patient management decisions. A negative result may occur with  improper specimen  collection/handling, submission of specimen other than nasopharyngeal swab, presence of viral mutation(s) within the areas targeted by this assay, and inadequate number of viral copies(<138 copies/mL). A negative result must be combined with clinical observations, patient history, and epidemiological information. The expected result is Negative.  Fact Sheet for Patients:  bloggercourse.com  Fact Sheet for Healthcare Providers:  seriousbroker.it  This test is no t yet approved or cleared by the [place]  FDA and  has been authorized for detection and/or diagnosis of SARS-CoV-2 by FDA under an Emergency Use Authorization (EUA). This EUA will remain  in effect (meaning this test can be used) for the duration of the COVID-19 declaration under Section 564(b)(1) of the Act, 21 U.S.C.section 360bbb-3(b)(1), unless the authorization is terminated  or revoked sooner.       Influenza A by PCR NEGATIVE NEGATIVE Final   Influenza B by PCR NEGATIVE NEGATIVE Final    Comment: (NOTE) The Xpert Xpress SARS-CoV-2/FLU/RSV plus assay is intended as an aid in the diagnosis of influenza from Nasopharyngeal swab specimens and should not be used as a sole basis for treatment. Nasal washings and aspirates are unacceptable for Xpert Xpress SARS-CoV-2/FLU/RSV testing.  Fact Sheet for Patients: bloggercourse.com  Fact Sheet for Healthcare Providers: seriousbroker.it  This test is not yet approved or cleared by the [place]  FDA and has been authorized for detection and/or diagnosis of SARS-CoV-2 by FDA under an Emergency Use Authorization (EUA). This EUA will remain in effect (meaning this test can be used) for the duration of the COVID-19 declaration under Section 564(b)(1) of the Act, 21 U.S.C. section 360bbb-3(b)(1), unless the authorization is terminated or revoked.     Resp Syncytial  Virus by PCR NEGATIVE NEGATIVE Final    Comment: (NOTE) Fact Sheet for Patients: bloggercourse.com  Fact Sheet for Healthcare Providers: seriousbroker.it  This test is not yet approved or cleared by the [place]  FDA and has been authorized for detection and/or diagnosis of SARS-CoV-2 by FDA under an Emergency Use Authorization (EUA). This EUA will remain in effect (meaning this  test can be used) for the duration of the COVID-19 declaration under Section 564(b)(1) of the Act, 21 U.S.C. section 360bbb-3(b)(1), unless the authorization is terminated or revoked.  Performed at [facility], [address] [place], [place] [address]   Blood Culture (routine x 2)     Status: None (Preliminary result)   Collection Time: 02/03/24  4:15 PM   Specimen: BLOOD  Result Value Ref Range Status   Specimen Description   Final    BLOOD SITE NOT SPECIFIED Performed at [facility], [address], [place], [place] [address]    Special Requests   Final    BOTTLES DRAWN AEROBIC AND ANAEROBIC Blood Culture adequate volume Performed at [facility], [address], [place], [place] [address]    Culture   Final    NO GROWTH 3 DAYS Performed at [facility], [address] [place], [place] [address]    Report Status PENDING  Incomplete  Resp panel by RT-PCR (RSV, Flu A&B, Covid) Anterior Nasal Swab     Status: None   Collection Time: 02/03/24  4:20 PM   Specimen: Anterior Nasal Swab  Result Value Ref Range Status   SARS Coronavirus 2 by RT PCR NEGATIVE NEGATIVE Final    Comment: (NOTE) SARS-CoV-2 target nucleic acids are NOT DETECTED.  The SARS-CoV-2 RNA is generally detectable in upper respiratory specimens during the acute phase of infection. The lowest concentration of SARS-CoV-2 viral copies this assay can detect is 138 copies/mL. A negative result does not preclude  SARS-Cov-2 infection and should not be used as the sole basis for treatment or other patient management decisions. A negative result may occur with  improper specimen collection/handling, submission of specimen other than nasopharyngeal swab, presence of viral mutation(s) within the areas targeted by this assay, and inadequate number of viral copies(<138 copies/mL). A negative result must be combined with clinical observations, patient history, and epidemiological information. The expected result is Negative.  Fact Sheet for Patients:  bloggercourse.com  Fact Sheet for Healthcare Providers:  seriousbroker.it  This test is no t yet approved or cleared by the [place]  FDA and  has been authorized for detection and/or diagnosis of SARS-CoV-2 by FDA under an Emergency Use Authorization (EUA). This EUA will remain  in effect (meaning this test can be used) for the duration of the COVID-19 declaration under Section 564(b)(1) of the Act, 21 U.S.C.section 360bbb-3(b)(1), unless the authorization is terminated  or revoked sooner.       Influenza A by PCR NEGATIVE NEGATIVE Final   Influenza B by PCR NEGATIVE NEGATIVE Final    Comment: (NOTE) The Xpert Xpress SARS-CoV-2/FLU/RSV plus assay is intended as an aid in the diagnosis of influenza from Nasopharyngeal swab specimens and should not be used as a sole basis for treatment. Nasal washings and aspirates are unacceptable for Xpert Xpress SARS-CoV-2/FLU/RSV testing.  Fact Sheet for Patients: bloggercourse.com  Fact Sheet for Healthcare Providers: seriousbroker.it  This test is not yet approved or cleared by the [place]  FDA and has been authorized for detection and/or diagnosis of SARS-CoV-2 by FDA under an Emergency Use Authorization (EUA). This EUA will remain in effect (meaning this test can be used) for the duration of  the COVID-19 declaration under Section 564(b)(1) of the Act, 21 U.S.C. section 360bbb-3(b)(1), unless the authorization is terminated or revoked.     Resp Syncytial Virus by PCR NEGATIVE NEGATIVE Final    Comment: (NOTE) Fact Sheet for Patients:  bloggercourse.com  Fact Sheet for Healthcare Providers: seriousbroker.it  This test is not yet approved or cleared by the [place]  FDA and has been authorized for detection and/or diagnosis of SARS-CoV-2 by FDA under an Emergency Use Authorization (EUA). This EUA will remain in effect (meaning this test can be used) for the duration of the COVID-19 declaration under Section 564(b)(1) of the Act, 21 U.S.C. section 360bbb-3(b)(1), unless the authorization is terminated or revoked.  Performed at [facility], [address], [place], [place] [address]   Blood Culture (routine x 2)     Status: None (Preliminary result)   Collection Time: 02/04/24  1:20 AM   Specimen: BLOOD  Result Value Ref Range Status   Specimen Description   Final    BLOOD BLOOD RIGHT ARM Performed at [facility], [address] [place], [place] [address]    Special Requests   Final    BOTTLES DRAWN AEROBIC ONLY Blood Culture adequate volume Performed at [facility], [address], [place], [place] [address]    Culture   Final    NO GROWTH 2 DAYS Performed at [facility], [address], [place], [place] [address]    Report Status PENDING  Incomplete  Respiratory (~20 pathogens) panel by PCR     Status: None   Collection Time: 02/04/24  8:34 AM   Specimen: Nasopharyngeal Swab; Respiratory  Result Value Ref Range Status   Adenovirus NOT DETECTED NOT DETECTED Final   Coronavirus 229E NOT DETECTED NOT DETECTED Final    Comment: (NOTE) The Coronavirus on the Respiratory Panel, DOES NOT test for the novel  Coronavirus (2019 nCoV)     Coronavirus HKU1 NOT DETECTED NOT DETECTED Final   Coronavirus NL63 NOT DETECTED NOT DETECTED Final   Coronavirus OC43 NOT DETECTED NOT DETECTED Final   Metapneumovirus NOT DETECTED NOT DETECTED Final   Rhinovirus / Enterovirus NOT DETECTED NOT DETECTED Final   Influenza A NOT DETECTED NOT DETECTED Final   Influenza B NOT DETECTED NOT DETECTED Final   Parainfluenza Virus 1 NOT DETECTED NOT DETECTED Final   Parainfluenza Virus 2 NOT DETECTED NOT DETECTED Final   Parainfluenza Virus 3 NOT DETECTED NOT DETECTED Final   Parainfluenza Virus 4 NOT DETECTED NOT DETECTED Final   Respiratory Syncytial Virus NOT DETECTED NOT DETECTED Final   Bordetella pertussis NOT DETECTED NOT DETECTED Final   Bordetella Parapertussis NOT DETECTED NOT DETECTED Final   Chlamydophila pneumoniae NOT DETECTED NOT DETECTED Final   Mycoplasma pneumoniae NOT DETECTED NOT DETECTED Final    Comment: Performed at [facility], [address], [place], [place] [address]         Radiology Studies: No results found.          LOS: 3 days   Time spent= 35 mins    Reyes VEAR Gaw, MD [facility]  If 7PM-7AM, please contact night-coverage  02/06/2024, 3:14 PM

## 2024-02-06 NOTE — Progress Notes (Signed)
 Patient is barely reaching 500 on the IS, will continue to encourage great efforts. Pt also has been encouraged to use his flutter valve which he is doing

## 2024-02-06 NOTE — Plan of Care (Signed)

## 2024-02-06 NOTE — Progress Notes (Signed)
 Messaged MD, patient has been here since Wednesday without his cpap.  Can you please order?

## 2024-02-06 NOTE — Progress Notes (Signed)
   02/06/24 2300  BiPAP/CPAP/SIPAP  $ Non-Invasive Home Ventilator  Initial  $ Face Mask Large  Yes  BiPAP/CPAP/SIPAP Pt Type Adult  BiPAP/CPAP/SIPAP Resmed  Mask Type Full face mask  Dentures removed? Not applicable  Mask Size Large  Respiratory Rate 24 breaths/min  Flow Rate 5 lpm  Patient Home Equipment No  Auto Titrate Yes (8-20 cmH2O)

## 2024-02-07 ENCOUNTER — Inpatient Hospital Stay (HOSPITAL_COMMUNITY): Payer: Medicaid Other

## 2024-02-07 DIAGNOSIS — J189 Pneumonia, unspecified organism: Secondary | ICD-10-CM | POA: Diagnosis not present

## 2024-02-07 LAB — CBC
HCT: 42.4 % (ref 39.0–52.0)
Hemoglobin: 12.8 g/dL — ABNORMAL LOW (ref 13.0–17.0)
MCH: 28.4 pg (ref 26.0–34.0)
MCHC: 30.2 g/dL (ref 30.0–36.0)
MCV: 94.2 fL (ref 80.0–100.0)
Platelets: 306 10*3/uL (ref 150–400)
RBC: 4.5 MIL/uL (ref 4.22–5.81)
RDW: 13 % (ref 11.5–15.5)
WBC: 30.6 10*3/uL — ABNORMAL HIGH (ref 4.0–10.5)
nRBC: 0 % (ref 0.0–0.2)

## 2024-02-07 LAB — BASIC METABOLIC PANEL
Anion gap: 10 (ref 5–15)
BUN: 16 mg/dL (ref 6–20)
CO2: 40 mmol/L — ABNORMAL HIGH (ref 22–32)
Calcium: 8.8 mg/dL — ABNORMAL LOW (ref 8.9–10.3)
Chloride: 88 mmol/L — ABNORMAL LOW (ref 98–111)
Creatinine, Ser: 0.87 mg/dL (ref 0.61–1.24)
GFR, Estimated: >60 mL/min (ref >60)
Glucose, Bld: 133 mg/dL — ABNORMAL HIGH (ref 70–99)
Potassium: 3.6 mmol/L (ref 3.5–5.1)
Sodium: 138 mmol/L (ref 135–145)

## 2024-02-07 MED ORDER — IPRATROPIUM-ALBUTEROL 0.5-2.5 (3) MG/3ML IN SOLN
3.0000 mL | Freq: Two times a day (BID) | RESPIRATORY_TRACT | Status: DC
  Administered 2024-02-07 – 2024-02-08 (×2): 3 mL via RESPIRATORY_TRACT
  Filled 2024-02-07 (×2): qty 3

## 2024-02-07 NOTE — Progress Notes (Signed)
 PROGRESS NOTE    Joseph Morales  FMW:[id number] DOB: [birth year] DOA: 02/03/2024 PCP: Corinthia Isaiah Fellows, MD    Brief Narrative:  53 year old with history of COPD, asthma, diastolic CHF, obesity hypoventilation syndrome, HTN came to the ER with complaints of shortness of breath and cough.  Chronically uses 4 L of nasal cannula at home and in the ER requiring 15 L.  Chest x-ray showed left upper and lower lobe pneumonia.  Had signs of sepsis and started on Rocephin  and azithromycin .  Now with COPD exacerbation keeping him in house.   Assessment & Plan:  Principal Problem:   CAP (community acquired pneumonia) Active Problems:   Asthma   Acute on chronic diastolic CHF (congestive heart failure) (HCC)   Chronic venous insufficiency   Obesity, Class III, BMI 40-49.9 (morbid obesity) (HCC)   Chronic respiratory failure with hypoxia and hypercapnia (HCC)   Essential hypertension   Hyperlipidemia   Acute respiratory distress secondary to community-acquired pneumonia Sepsis secondary to community-acquired pneumonia Acute on chronic hypoxia -At home uses 4 L nasal cannula, currently on 5L O2.  Still not moving air very well. - COVID/RSV/flu is negative.  Respiratory panel negative.  - Treating as CAP and subsequent COPD exacerbation.  Bronchodilators, I-S/flutter valve.  Out of bed to chair.  Procalcitonin elevated, continue antibiotics.  Mildly elevated BNP as well. -Echo pending. - on azithro/CTX regimen.  Will narrow prior to DC.  Started on 2/6.  Day 5 will be 2/10 -Repeat CXR showed residual pneumonia but overall improvement in aeration. -Improved since admission but still not quite to baseline.   Acute on chronic diastolic CHF - Very mild signs of volume overload, will give 1 dose of Lasix  80 mg IV followed by his home p.o.  Continue home Coreg , Lasix , losartan . -Echo pending   Obesity hypoventilation syndrome -Supportive care and supplemental oxygen - CPAP ordered for night use.   Uses this on an outpatient basis.   Hyponatremia -Resolved   Leukocytosis - Chronically elevated WBC greater than 25.  This morning 48.  I am not sure if patient is following outpatient with any provider for this.  I do not see any workup in our system in Care Everywhere.  Once he is over this acute phase, he should follow-up with outpatient hematology to workup his chronic leukocytosis    Essential hypertension -Coreg , losartan , Lasix  p.o. twice daily.  IV as needed - BP good here.     Diabetes mellitus type 2 -This is per history.  Will check A1c.  Sliding scale and Accu-Cheks   Chronic venous insufficiency -Supportive care and elevate legs   Depression/anxiety - On Wellbutrin     DVT prophylaxis: Lovenox       Code Status: Full Code Family Communication:   Status is: Inpatient Remains inpatient appropriate because: Continue hospital stay for management of hypoxia Disposition: Likely able to discharge home 2/10 versus 2/11 depending on improvement.   Subjective: Seen at bed side, tells me he feels a little better compared to yesterday.  Still with notable cough and not great air movement.  Eating and drinking well.     Examination:   General exam: Appears calm and comfortable, morbidly obese Respiratory system: Diffuse wheezing noted bilateral bases.  Does have only fair amount of air movement bilateral bases. Cardiovascular system: S1 & S2 heard but distant.  Regular rate and rhythm. Gastrointestinal system: Abdomen is nondistended, soft and nontender. No organomegaly or masses felt. Normal bowel sounds heard. Central nervous system: Alert and oriented. No  focal neurological deficits. Extremities: +1 edema ankles bilaterally. Skin: No rashes, lesions or ulcers Psychiatry: Judgement and insight appear normal. Mood & affect appropriate.           Diet Orders (From admission, onward)     Start     Ordered   02/03/24 2220  Diet Heart Room service appropriate? Yes;  Fluid consistency: Thin  Diet effective now       Question Answer Comment  Room service appropriate? Yes   Fluid consistency: Thin      02/03/24 2219            Objective: Vitals:   02/07/24 0810 02/07/24 1009 02/07/24 1014 02/07/24 1230  BP:  (!) 143/82 (!) 143/82 133/81  Pulse:   97 92  Resp:   20 16  Temp:   98.4 F (36.9 C) 98 F (36.7 C)  TempSrc:   Oral Oral  SpO2: 94%  93% 95%  Weight:      Height:        Intake/Output Summary (Last 24 hours) at 02/07/2024 1624 Last data filed at 02/07/2024 1534 Gross per 24 hour  Intake 1159 ml  Output 2780 ml  Net -1621 ml   Filed Weights   02/03/24 1707  Weight: (!) 176.6 kg    Scheduled Meds:  buPROPion   300 mg Oral Daily   carvedilol   6.25 mg Oral BID   enoxaparin  (LOVENOX ) injection  80 mg Subcutaneous Q24H   fluticasone   1 spray Each Nare Daily   furosemide   80 mg Oral BID   ipratropium-albuterol   3 mL Nebulization BID   losartan   25 mg Oral Daily   Continuous Infusions:  sodium chloride  10 mL/hr at 02/04/24 1108   azithromycin  500 mg (02/07/24 1604)   cefTRIAXone  (ROCEPHIN )  IV Stopped (02/06/24 1703)    Nutritional status     Body mass index is 48.66 kg/m.  Data Reviewed:   CBC: Recent Labs  Lab 02/02/24 1314 02/03/24 1620 02/04/24 0448 02/05/24 0427 02/06/24 0508 02/07/24 0456  WBC 28.2* 45.5* 48.0* 37.0* 27.6* 30.6*  NEUTROABS 24.4* 37.1*  --   --   --   --   HGB 14.3 13.9 13.7 12.6* 13.5 12.8*  HCT 46.5 44.6 45.2 42.3 44.8 42.4  MCV 93.2 92.9 94.6 95.7 96.6 94.2  PLT 247 244 256 250 268 306   Basic Metabolic Panel: Recent Labs  Lab 02/02/24 1314 02/03/24 1620 02/04/24 0448 02/05/24 0427 02/06/24 0508 02/07/24 0456  NA 136 133* 135 135 136 138  K 4.1 3.8 4.4 4.2 3.7 3.6  CL 97* 97* 94* 92* 87* 88*  CO2 31 28 31  33* 38* 40*  GLUCOSE 180* 165* 136* 162* 149* 133*  BUN 15 17 17 20 16 16   CREATININE 1.08 1.18 1.15 0.99 0.95 0.87  CALCIUM  8.8* 8.2* 8.4* 8.4* 8.8* 8.8*  MG 1.6*   --   --  2.2  --   --   PHOS  --   --   --  2.6  --   --    GFR: Estimated Creatinine Clearance: 174.3 mL/min (by C-G formula based on SCr of 0.87 mg/dL). Liver Function Tests: Recent Labs  Lab 02/02/24 1314 02/03/24 1620 02/04/24 0448  AST 24 24 25   ALT 22 25 31   ALKPHOS 68 61 69  BILITOT 1.7* 2.1* 2.1*  PROT 8.5* 7.6 8.0  ALBUMIN 4.0 3.3* 3.3*   No results for input(s): LIPASE, AMYLASE in the last 168 hours. No results for input(s):  AMMONIA in the last 168 hours. Coagulation Profile: Recent Labs  Lab 02/03/24 1620  INR 1.1   Cardiac Enzymes: No results for input(s): CKTOTAL, CKMB, CKMBINDEX, TROPONINI in the last 168 hours. BNP (last 3 results) No results for input(s): PROBNP in the last 8760 hours. HbA1C: No results for input(s): HGBA1C in the last 72 hours. CBG: No results for input(s): GLUCAP in the last 168 hours. Lipid Profile: No results for input(s): CHOL, HDL, LDLCALC, TRIG, CHOLHDL, LDLDIRECT in the last 72 hours. Thyroid Function Tests: No results for input(s): TSH, T4TOTAL, FREET4, T3FREE, THYROIDAB in the last 72 hours. Anemia Panel: No results for input(s): VITAMINB12, FOLATE, FERRITIN, TIBC, IRON, RETICCTPCT in the last 72 hours. Sepsis Labs: Recent Labs  Lab 02/03/24 1626 02/04/24 0120 02/04/24 0448  PROCALCITON  --   --  3.03  LATICACIDVEN 1.0 1.3 0.8    Recent Results (from the past 240 hours)  Resp panel by RT-PCR (RSV, Flu A&B, Covid) Anterior Nasal Swab     Status: None   Collection Time: 02/02/24 12:44 PM   Specimen: Anterior Nasal Swab  Result Value Ref Range Status   SARS Coronavirus 2 by RT PCR NEGATIVE NEGATIVE Final    Comment: (NOTE) SARS-CoV-2 target nucleic acids are NOT DETECTED.  The SARS-CoV-2 RNA is generally detectable in upper respiratory specimens during the acute phase of infection. The lowest concentration of SARS-CoV-2 viral copies this assay can detect is 138  copies/mL. A negative result does not preclude SARS-Cov-2 infection and should not be used as the sole basis for treatment or other patient management decisions. A negative result may occur with  improper specimen collection/handling, submission of specimen other than nasopharyngeal swab, presence of viral mutation(s) within the areas targeted by this assay, and inadequate number of viral copies(<138 copies/mL). A negative result must be combined with clinical observations, patient history, and epidemiological information. The expected result is Negative.  Fact Sheet for Patients:  bloggercourse.com  Fact Sheet for Healthcare Providers:  seriousbroker.it  This test is no t yet approved or cleared by the [place]  FDA and  has been authorized for detection and/or diagnosis of SARS-CoV-2 by FDA under an Emergency Use Authorization (EUA). This EUA will remain  in effect (meaning this test can be used) for the duration of the COVID-19 declaration under Section 564(b)(1) of the Act, 21 U.S.C.section 360bbb-3(b)(1), unless the authorization is terminated  or revoked sooner.       Influenza A by PCR NEGATIVE NEGATIVE Final   Influenza B by PCR NEGATIVE NEGATIVE Final    Comment: (NOTE) The Xpert Xpress SARS-CoV-2/FLU/RSV plus assay is intended as an aid in the diagnosis of influenza from Nasopharyngeal swab specimens and should not be used as a sole basis for treatment. Nasal washings and aspirates are unacceptable for Xpert Xpress SARS-CoV-2/FLU/RSV testing.  Fact Sheet for Patients: bloggercourse.com  Fact Sheet for Healthcare Providers: seriousbroker.it  This test is not yet approved or cleared by the [place]  FDA and has been authorized for detection and/or diagnosis of SARS-CoV-2 by FDA under an Emergency Use Authorization (EUA). This EUA will remain in effect (meaning  this test can be used) for the duration of the COVID-19 declaration under Section 564(b)(1) of the Act, 21 U.S.C. section 360bbb-3(b)(1), unless the authorization is terminated or revoked.     Resp Syncytial Virus by PCR NEGATIVE NEGATIVE Final    Comment: (NOTE) Fact Sheet for Patients: bloggercourse.com  Fact Sheet for Healthcare Providers: seriousbroker.it  This test is not yet  approved or cleared by the [place]  FDA and has been authorized for detection and/or diagnosis of SARS-CoV-2 by FDA under an Emergency Use Authorization (EUA). This EUA will remain in effect (meaning this test can be used) for the duration of the COVID-19 declaration under Section 564(b)(1) of the Act, 21 U.S.C. section 360bbb-3(b)(1), unless the authorization is terminated or revoked.  Performed at [facility], [address], [place], [place] [address]   Blood Culture (routine x 2)     Status: None (Preliminary result)   Collection Time: 02/03/24  4:15 PM   Specimen: BLOOD  Result Value Ref Range Status   Specimen Description   Final    BLOOD SITE NOT SPECIFIED Performed at [facility], [address], [place], [place] [address]    Special Requests   Final    BOTTLES DRAWN AEROBIC AND ANAEROBIC Blood Culture adequate volume Performed at [facility], [address], [place], [place] [address]    Culture   Final    NO GROWTH 4 DAYS Performed at [facility], [address] [place], [place] [address]    Report Status PENDING  Incomplete  Resp panel by RT-PCR (RSV, Flu A&B, Covid) Anterior Nasal Swab     Status: None   Collection Time: 02/03/24  4:20 PM   Specimen: Anterior Nasal Swab  Result Value Ref Range Status   SARS Coronavirus 2 by RT PCR NEGATIVE NEGATIVE Final    Comment: (NOTE) SARS-CoV-2 target nucleic acids are NOT DETECTED.  The SARS-CoV-2 RNA is generally  detectable in upper respiratory specimens during the acute phase of infection. The lowest concentration of SARS-CoV-2 viral copies this assay can detect is 138 copies/mL. A negative result does not preclude SARS-Cov-2 infection and should not be used as the sole basis for treatment or other patient management decisions. A negative result may occur with  improper specimen collection/handling, submission of specimen other than nasopharyngeal swab, presence of viral mutation(s) within the areas targeted by this assay, and inadequate number of viral copies(<138 copies/mL). A negative result must be combined with clinical observations, patient history, and epidemiological information. The expected result is Negative.  Fact Sheet for Patients:  bloggercourse.com  Fact Sheet for Healthcare Providers:  seriousbroker.it  This test is no t yet approved or cleared by the [place]  FDA and  has been authorized for detection and/or diagnosis of SARS-CoV-2 by FDA under an Emergency Use Authorization (EUA). This EUA will remain  in effect (meaning this test can be used) for the duration of the COVID-19 declaration under Section 564(b)(1) of the Act, 21 U.S.C.section 360bbb-3(b)(1), unless the authorization is terminated  or revoked sooner.       Influenza A by PCR NEGATIVE NEGATIVE Final   Influenza B by PCR NEGATIVE NEGATIVE Final    Comment: (NOTE) The Xpert Xpress SARS-CoV-2/FLU/RSV plus assay is intended as an aid in the diagnosis of influenza from Nasopharyngeal swab specimens and should not be used as a sole basis for treatment. Nasal washings and aspirates are unacceptable for Xpert Xpress SARS-CoV-2/FLU/RSV testing.  Fact Sheet for Patients: bloggercourse.com  Fact Sheet for Healthcare Providers: seriousbroker.it  This test is not yet approved or cleared by the [place]  FDA  and has been authorized for detection and/or diagnosis of SARS-CoV-2 by FDA under an Emergency Use Authorization (EUA). This EUA will remain in effect (meaning this test can be used) for the duration of the COVID-19 declaration under Section 564(b)(1) of  the Act, 21 U.S.C. section 360bbb-3(b)(1), unless the authorization is terminated or revoked.     Resp Syncytial Virus by PCR NEGATIVE NEGATIVE Final    Comment: (NOTE) Fact Sheet for Patients: bloggercourse.com  Fact Sheet for Healthcare Providers: seriousbroker.it  This test is not yet approved or cleared by the [place]  FDA and has been authorized for detection and/or diagnosis of SARS-CoV-2 by FDA under an Emergency Use Authorization (EUA). This EUA will remain in effect (meaning this test can be used) for the duration of the COVID-19 declaration under Section 564(b)(1) of the Act, 21 U.S.C. section 360bbb-3(b)(1), unless the authorization is terminated or revoked.  Performed at [facility], [address], [place], [place] [address]   Blood Culture (routine x 2)     Status: None (Preliminary result)   Collection Time: 02/04/24  1:20 AM   Specimen: BLOOD  Result Value Ref Range Status   Specimen Description   Final    BLOOD BLOOD RIGHT ARM Performed at [facility], [address], [place], [place] [address]    Special Requests   Final    BOTTLES DRAWN AEROBIC ONLY Blood Culture adequate volume Performed at [facility], [address], [place], [place] [address]    Culture   Final    NO GROWTH 3 DAYS Performed at [facility], [address], [place], [place] [address]    Report Status PENDING  Incomplete  Respiratory (~20 pathogens) panel by PCR     Status: None   Collection Time: 02/04/24  8:34 AM   Specimen: Nasopharyngeal Swab; Respiratory  Result Value Ref Range Status   Adenovirus NOT  DETECTED NOT DETECTED Final   Coronavirus 229E NOT DETECTED NOT DETECTED Final    Comment: (NOTE) The Coronavirus on the Respiratory Panel, DOES NOT test for the novel  Coronavirus (2019 nCoV)    Coronavirus HKU1 NOT DETECTED NOT DETECTED Final   Coronavirus NL63 NOT DETECTED NOT DETECTED Final   Coronavirus OC43 NOT DETECTED NOT DETECTED Final   Metapneumovirus NOT DETECTED NOT DETECTED Final   Rhinovirus / Enterovirus NOT DETECTED NOT DETECTED Final   Influenza A NOT DETECTED NOT DETECTED Final   Influenza B NOT DETECTED NOT DETECTED Final   Parainfluenza Virus 1 NOT DETECTED NOT DETECTED Final   Parainfluenza Virus 2 NOT DETECTED NOT DETECTED Final   Parainfluenza Virus 3 NOT DETECTED NOT DETECTED Final   Parainfluenza Virus 4 NOT DETECTED NOT DETECTED Final   Respiratory Syncytial Virus NOT DETECTED NOT DETECTED Final   Bordetella pertussis NOT DETECTED NOT DETECTED Final   Bordetella Parapertussis NOT DETECTED NOT DETECTED Final   Chlamydophila pneumoniae NOT DETECTED NOT DETECTED Final   Mycoplasma pneumoniae NOT DETECTED NOT DETECTED Final    Comment: Performed at [facility], [address] [place], [place] [address]         Radiology Studies: DG Chest Port 1 View Result Date: 02/07/2024 CLINICAL DATA:  The pneumonia.  Shortness of breath.  Cough. EXAM: PORTABLE CHEST - 1 VIEW COMPARISON:  02/03/2024 FINDINGS: Cardiomediastinal silhouette and pulmonary vasculature are within normal limits. Interval improvement of left mid lung opacity. Persistent airspace opacity of the left lower lung. Right lung is well aerated. IMPRESSION: Interval improvement in aeration of the left lung with moderate opacity still remaining in the left base consistent with residual pneumonia. Electronically Signed   By: Aliene Lloyd M.D.   On: 02/07/2024 15:32  LOS: 4 days   Time spent= 35 mins    Reyes VEAR Gaw, MD [facility]  If 7PM-7AM, please contact  night-coverage  02/07/2024, 4:24 PM

## 2024-02-07 NOTE — Plan of Care (Signed)

## 2024-02-07 NOTE — Progress Notes (Signed)
 Rocephin  IV moved to 2000 D/T loss of IV access. New IV being placed at this time by IV nurse. I will continue Zithromax  dose once new IV line is established.

## 2024-02-07 NOTE — Plan of Care (Signed)
  Problem: Clinical Measurements: Goal: Diagnostic test results will improve Outcome: Progressing Goal: Respiratory complications will improve Outcome: Progressing Goal: Cardiovascular complication will be avoided Outcome: Progressing   Problem: Coping: Goal: Level of anxiety will decrease Outcome: Progressing   Problem: Safety: Goal: Ability to remain free from injury will improve Outcome: Progressing   Problem: Respiratory: Goal: Ability to maintain adequate ventilation will improve Outcome: Progressing Goal: Ability to maintain a clear airway will improve Outcome: Progressing

## 2024-02-08 ENCOUNTER — Other Ambulatory Visit (HOSPITAL_COMMUNITY): Payer: Medicaid Other

## 2024-02-08 DIAGNOSIS — J189 Pneumonia, unspecified organism: Secondary | ICD-10-CM | POA: Diagnosis not present

## 2024-02-08 LAB — DIFFERENTIAL
Abs Immature Granulocytes: 4.7 10*3/uL — ABNORMAL HIGH (ref 0.00–0.07)
Band Neutrophils: 3 %
Basophils Absolute: 0 10*3/uL (ref 0.0–0.1)
Basophils Relative: 0 %
Eosinophils Absolute: 0.3 10*3/uL (ref 0.0–0.5)
Eosinophils Relative: 1 %
Lymphocytes Relative: 8 %
Lymphs Abs: 2.7 10*3/uL (ref 0.7–4.0)
Metamyelocytes Relative: 5 %
Monocytes Absolute: 3.7 10*3/uL — ABNORMAL HIGH (ref 0.1–1.0)
Monocytes Relative: 11 %
Myelocytes: 9 %
Neutro Abs: 22 10*3/uL — ABNORMAL HIGH (ref 1.7–7.7)
Neutrophils Relative %: 63 %

## 2024-02-08 LAB — CBC
HCT: 41.3 % (ref 39.0–52.0)
Hemoglobin: 12.4 g/dL — ABNORMAL LOW (ref 13.0–17.0)
MCH: 28.1 pg (ref 26.0–34.0)
MCHC: 30 g/dL (ref 30.0–36.0)
MCV: 93.7 fL (ref 80.0–100.0)
Platelets: 349 10*3/uL (ref 150–400)
RBC: 4.41 MIL/uL (ref 4.22–5.81)
RDW: 13.1 % (ref 11.5–15.5)
WBC: 33.4 10*3/uL — ABNORMAL HIGH (ref 4.0–10.5)
nRBC: 0.1 % (ref 0.0–0.2)

## 2024-02-08 LAB — BASIC METABOLIC PANEL
Anion gap: 9 (ref 5–15)
BUN: 18 mg/dL (ref 6–20)
CO2: 40 mmol/L — ABNORMAL HIGH (ref 22–32)
Calcium: 8.9 mg/dL (ref 8.9–10.3)
Chloride: 89 mmol/L — ABNORMAL LOW (ref 98–111)
Creatinine, Ser: 0.94 mg/dL (ref 0.61–1.24)
GFR, Estimated: >60 mL/min (ref >60)
Glucose, Bld: 134 mg/dL — ABNORMAL HIGH (ref 70–99)
Potassium: 3.7 mmol/L (ref 3.5–5.1)
Sodium: 138 mmol/L (ref 135–145)

## 2024-02-08 LAB — TECHNOLOGIST SMEAR REVIEW

## 2024-02-08 LAB — GLUCOSE, CAPILLARY: Glucose-Capillary: 148 mg/dL — ABNORMAL HIGH (ref 70–99)

## 2024-02-08 MED ORDER — INFLUENZA VIRUS VACC SPLIT PF (FLUZONE) 0.5 ML IM SUSY
0.5000 mL | PREFILLED_SYRINGE | Freq: Once | INTRAMUSCULAR | Status: AC
  Administered 2024-02-08: 0.5 mL via INTRAMUSCULAR
  Filled 2024-02-08: qty 0.5

## 2024-02-08 MED ORDER — PNEUMOCOCCAL 20-VAL CONJ VACC 0.5 ML IM SUSY: 0.5000 mL | PREFILLED_SYRINGE | INTRAMUSCULAR | Status: DC | PRN

## 2024-02-08 MED ORDER — PNEUMOCOCCAL 20-VAL CONJ VACC 0.5 ML IM SUSY: 0.5000 mL | PREFILLED_SYRINGE | INTRAMUSCULAR | Status: DC

## 2024-02-08 MED ORDER — INFLUENZA VIRUS VACC SPLIT PF (FLUZONE) 0.5 ML IM SUSY: 0.5000 mL | PREFILLED_SYRINGE | INTRAMUSCULAR | Status: DC

## 2024-02-08 NOTE — Progress Notes (Signed)
   02/08/24 0200  BiPAP/CPAP/SIPAP  Reason BIPAP/CPAP not in use Non-compliant (wanted to wear oxygen instead)

## 2024-02-08 NOTE — Discharge Summary (Addendum)
 Joseph Morales JYN:[id number] DOB: 05-24-1971 DOA: 02/03/2024  PCP: Sherryl Domino, MD  Admit date: 02/03/2024 Discharge date: 02/08/2024  Time spent: 35 minutes  Recommendations for Outpatient Follow-up:  Pcp f/u  Pulmonology f/u, cardiology f/u Attention to chronic leukocytosis at pcp f/u, f/u results of peripheral smear (pending at time of discharge), consider hematology referral    Discharge Diagnoses:  Principal Problem:   CAP (community acquired pneumonia) Active Problems:   Asthma   Acute on chronic diastolic CHF (congestive heart failure) (HCC)   Chronic venous insufficiency   Obesity, Class III, BMI 40-49.9 (morbid obesity) (HCC)   Chronic respiratory failure with hypoxia and hypercapnia (HCC)   Essential hypertension   Hyperlipidemia   Discharge Condition: stable  Diet recommendation: heart healthy  Filed Weights   02/03/24 1707  Weight: (!) 176.6 kg    History of present illness:  From admission h and p: Joseph Morales is a 53 y.o. male with medical history significant of COPD, asthma, chronic diastolic heart failure, obesity hypoventilation syndrome, essential hypertension who presented to the ER with shortness of breath and cough.  Patient has chronic respiratory failure on 4 L.  Was seen in the ER yesterday treated did better on the left.  Came back from home with the shortness of breath, fever cough and oxygen 791% on 15 L.  Patient also is tachypneic with initial heart rate of 144.  Was seen and evaluated in the ER.  Patient has leukocytosis with white count of 45.5 thousand acute viral screening was negative for COVID and influenza. Chest x-ray showed left upper and left lower lobe pneumonia.   Hospital Course:  Patient presents with cough and shortness of breath. Found to have CAP with acute hypoxic respiratory failure on his home chronic hypoxic respiratory failure. Respiratory panel negative. Treated for CAP with a 5 day course of ceftriaxone  and  azithromycin . Now feeling much better, breathing comfortably on home 4 liters and ambulating without assistance or significant desaturation. Patient is overdue to f/u with pulmonology for his copd/ohs and so we strongly advised he do so. Will need pcp f/u with a week or so. Also noted to have chronic leukocytosis, discharging provider has ordered a smear, the result of which is pending at time of discharge, will need attention to this at PCP f/u and consideration of hematology referral. Other chronic medical problems stable. Influenza and pneumococcal vaccines ordered to be given prior to discharge.  Procedures: none   Consultations: none  Discharge Exam: Vitals:   02/08/24 1012 02/08/24 1014  BP: 138/73 138/73  Pulse: 88 88  Resp: 18   Temp: (!) 97.5 F (36.4 C)   SpO2: 94%     General: NAD Cardiovascular: RRR Respiratory: clear  Ext: trace LE edema  Discharge Instructions   Discharge Instructions     Diet - low sodium heart healthy   Complete by: As directed    Increase activity slowly   Complete by: As directed       Allergies as of 02/08/2024   No Known Allergies      Medication List     STOP taking these medications    OZEMPIC (1 MG/DOSE) [place]   tadalafil 10 MG tablet Commonly known as: CIALIS   Viagra 25 MG tablet Generic drug: sildenafil       TAKE these medications    budesonide -formoterol  160-4.5 MCG/ACT inhaler Commonly known as: SYMBICORT  INHALE 2 PUFFS INTO THE LUNGS IN THE MORNING AND AT BEDTIME.   carvedilol  12.5 MG  tablet Commonly known as: COREG  Take 0.5 tablets (6.25 mg total) by mouth 2 (two) times daily. What changed: how much to take   clobetasol cream 0.05 % Commonly known as: TEMOVATE Apply 1 Application topically daily as needed (eczema).   [employer] Melatonin 5 MG Tabs Generic drug: melatonin Take 5 mg by mouth at bedtime as needed (sleep).   fluticasone  50 MCG/ACT nasal spray Commonly known as: FLONASE  Place 1 spray into  both nostrils daily as needed for allergies.   furosemide  40 MG tablet Commonly known as: LASIX  Take 80 mg by mouth 2 (two) times daily.   losartan  25 MG tablet Commonly known as: COZAAR  Take 25 mg by mouth daily.   OXYGEN Inhale 2-4 L into the lungs at bedtime.       No Known Allergies  Follow-up Information     Hollowell, Lesia Raring, MD Follow up.   Specialty: Family Medicine Why: within 1-2 weeks Contact information: [address] [place] [address] [phone]         [facility]. Call.   Specialty: Pulmonology Contact information: [address] [place]  [phone]        Tobb, Kardie, DO Follow up.   Specialty: Cardiology Contact information: [address] [place] [address] [place] [address] [phone]                  The results of significant diagnostics from this hospitalization (including imaging, microbiology, ancillary and laboratory) are listed below for reference.    Significant Diagnostic Studies: DG Chest Port 1 View Result Date: 02/07/2024 CLINICAL DATA:  The pneumonia.  Shortness of breath.  Cough. EXAM: PORTABLE CHEST - 1 VIEW COMPARISON:  02/03/2024 FINDINGS: Cardiomediastinal silhouette and pulmonary vasculature are within normal limits. Interval improvement of left mid lung opacity. Persistent airspace opacity of the left lower lung. Right lung is well aerated. IMPRESSION: Interval improvement in aeration of the left lung with moderate opacity still remaining in the left base consistent with residual pneumonia. Electronically Signed   By: Elester Grim M.D.   On: 02/07/2024 15:32   DG Chest Port 1 View Result Date: 02/03/2024 CLINICAL DATA:  Sepsis. EXAM: PORTABLE CHEST 1 VIEW COMPARISON:  Chest radiograph dated 02/02/2024 and CT dated 02/02/2024. FINDINGS: Areas of airspace opacity in the left upper and left lower lobes,  progressed since the prior radiograph. No pleural effusion or pneumothorax. The cardiac silhouette is within normal limits. No acute osseous pathology. IMPRESSION: Left upper and left lower lobe pneumonia. Electronically Signed   By: Angus Bark M.D.   On: 02/03/2024 18:03   CT Angio Chest PE W and/or Wo Contrast Result Date: 02/02/2024 CLINICAL DATA:  Pulmonary embolism (PE) suspected, low to intermediate prob, positive D-dimer Shortness of breath and fever for 4 days. EXAM: CT ANGIOGRAPHY CHEST WITH CONTRAST TECHNIQUE: Multidetector CT imaging of the chest was performed using the standard protocol during bolus administration of intravenous contrast. Multiplanar CT image reconstructions and MIPs were obtained to evaluate the vascular anatomy. RADIATION DOSE REDUCTION: This exam was performed according to the departmental dose-optimization program which includes automated exposure control, adjustment of the mA and/or kV according to patient size and/or use of iterative reconstruction technique. CONTRAST:  75mL OMNIPAQUE  IOHEXOL  350 MG/ML SOLN COMPARISON:  Radiographs 02/02/2024 and 11/09/2023.  CT 10/16/2020. FINDINGS: Technical note: Despite efforts by the technologist and patient, mild motion artifact is present on today's exam and could not  be eliminated. This reduces exam sensitivity and specificity. Cardiovascular: The pulmonary arteries are adequately opacified with contrast to the level of the segmental branches. There is no evidence of acute pulmonary embolism. No acute systemic arterial abnormalities are identified. The heart size is normal. There is no pericardial effusion. Mediastinum/Nodes: There are no enlarged mediastinal, hilar or axillary lymph nodes. The thyroid gland, trachea and esophagus demonstrate no significant findings. Lungs/Pleura: No pleural effusion or pneumothorax. Pulmonary assessment mildly limited by breathing artifact. There are multifocal patchy nodular airspace opacities  within the left upper lobe, lingula and anterior left lower lobe consistent with multilobar pneumonia. The right lung is clear. Upper abdomen:  No acute findings.  Probable mild hepatic steatosis. Musculoskeletal/Chest wall: There is no chest wall mass or suspicious osseous finding. Review of the MIP images confirms the above findings. IMPRESSION: 1. No evidence of acute pulmonary embolism or other acute vascular findings in the chest. 2. Multifocal patchy nodular airspace opacities within the left upper lobe, lingula and anterior left lower lobe consistent with multilobar pneumonia. Radiographic follow-up recommended to ensure resolution. 3. Probable mild hepatic steatosis. Electronically Signed   By: Elmon Hagedorn M.D.   On: 02/02/2024 17:23   DG Chest Portable 1 View Result Date: 02/02/2024 CLINICAL DATA:  Shortness of breath and fever. EXAM: PORTABLE CHEST 1 VIEW COMPARISON:  Chest x-ray dated November 09, 2023. FINDINGS: The patient is rotated to the right. The heart size and mediastinal contours are within normal limits. Normal pulmonary vascularity. Hazy density at the right lung base is felt to be overlapping soft tissue. No focal consolidation, pleural effusion, or pneumothorax. No acute osseous abnormality. IMPRESSION: 1. No active disease. Electronically Signed   By: Aleta Anda M.D.   On: 02/02/2024 14:11    Microbiology: Recent Results (from the past 240 hours)  Resp panel by RT-PCR (RSV, Flu A&B, Covid) Anterior Nasal Swab     Status: None   Collection Time: 02/02/24 12:44 PM   Specimen: Anterior Nasal Swab  Result Value Ref Range Status   SARS Coronavirus 2 by RT PCR NEGATIVE NEGATIVE Final    Comment: (NOTE) SARS-CoV-2 target nucleic acids are NOT DETECTED.  The SARS-CoV-2 RNA is generally detectable in upper respiratory specimens during the acute phase of infection. The lowest concentration of SARS-CoV-2 viral copies this assay can detect is 138 copies/mL. A negative result  does not preclude SARS-Cov-2 infection and should not be used as the sole basis for treatment or other patient management decisions. A negative result may occur with  improper specimen collection/handling, submission of specimen other than nasopharyngeal swab, presence of viral mutation(s) within the areas targeted by this assay, and inadequate number of viral copies(<138 copies/mL). A negative result must be combined with clinical observations, patient history, and epidemiological information. The expected result is Negative.  Fact Sheet for Patients:  BloggerCourse.com  Fact Sheet for Healthcare Providers:  SeriousBroker.it  This test is no t yet approved or cleared by the [place]  FDA and  has been authorized for detection and/or diagnosis of SARS-CoV-2 by FDA under an Emergency Use Authorization (EUA). This EUA will remain  in effect (meaning this test can be used) for the duration of the COVID-19 declaration under Section 564(b)(1) of the Act, 21 U.S.C.section 360bbb-3(b)(1), unless the authorization is terminated  or revoked sooner.       Influenza A by PCR NEGATIVE NEGATIVE Final   Influenza B by PCR NEGATIVE NEGATIVE Final    Comment: (NOTE) The Xpert Xpress SARS-CoV-2/FLU/RSV  plus assay is intended as an aid in the diagnosis of influenza from Nasopharyngeal swab specimens and should not be used as a sole basis for treatment. Nasal washings and aspirates are unacceptable for Xpert Xpress SARS-CoV-2/FLU/RSV testing.  Fact Sheet for Patients: BloggerCourse.com  Fact Sheet for Healthcare Providers: SeriousBroker.it  This test is not yet approved or cleared by the [place]  FDA and has been authorized for detection and/or diagnosis of SARS-CoV-2 by FDA under an Emergency Use Authorization (EUA). This EUA will remain in effect (meaning this test can be used) for  the duration of the COVID-19 declaration under Section 564(b)(1) of the Act, 21 U.S.C. section 360bbb-3(b)(1), unless the authorization is terminated or revoked.     Resp Syncytial Virus by PCR NEGATIVE NEGATIVE Final    Comment: (NOTE) Fact Sheet for Patients: BloggerCourse.com  Fact Sheet for Healthcare Providers: SeriousBroker.it  This test is not yet approved or cleared by the [place]  FDA and has been authorized for detection and/or diagnosis of SARS-CoV-2 by FDA under an Emergency Use Authorization (EUA). This EUA will remain in effect (meaning this test can be used) for the duration of the COVID-19 declaration under Section 564(b)(1) of the Act, 21 U.S.C. section 360bbb-3(b)(1), unless the authorization is terminated or revoked.  Performed at [facility], [address] [place], [place] [address]   Blood Culture (routine x 2)     Status: None (Preliminary result)   Collection Time: 02/03/24  4:15 PM   Specimen: BLOOD  Result Value Ref Range Status   Specimen Description   Final    BLOOD SITE NOT SPECIFIED Performed at [facility], [address], [place], [place] [address]    Special Requests   Final    BOTTLES DRAWN AEROBIC AND ANAEROBIC Blood Culture adequate volume Performed at [facility], [address], [place], [place] [address]    Culture   Final    NO GROWTH 4 DAYS Performed at [facility], [address], [place], [place] [address]    Report Status PENDING  Incomplete  Resp panel by RT-PCR (RSV, Flu A&B, Covid) Anterior Nasal Swab     Status: None   Collection Time: 02/03/24  4:20 PM   Specimen: Anterior Nasal Swab  Result Value Ref Range Status   SARS Coronavirus 2 by RT PCR NEGATIVE NEGATIVE Final    Comment: (NOTE) SARS-CoV-2 target nucleic acids are NOT DETECTED.  The SARS-CoV-2 RNA is generally detectable in upper  respiratory specimens during the acute phase of infection. The lowest concentration of SARS-CoV-2 viral copies this assay can detect is 138 copies/mL. A negative result does not preclude SARS-Cov-2 infection and should not be used as the sole basis for treatment or other patient management decisions. A negative result may occur with  improper specimen collection/handling, submission of specimen other than nasopharyngeal swab, presence of viral mutation(s) within the areas targeted by this assay, and inadequate number of viral copies(<138 copies/mL). A negative result must be combined with clinical observations, patient history, and epidemiological information. The expected result is Negative.  Fact Sheet for Patients:  BloggerCourse.com  Fact Sheet for Healthcare Providers:  SeriousBroker.it  This test is no t yet approved or cleared by the [place]  FDA and  has been authorized for detection and/or diagnosis of SARS-CoV-2 by FDA under an Emergency Use Authorization (EUA). This EUA will remain  in effect (meaning this test can be used) for the duration of the COVID-19 declaration under  Section 564(b)(1) of the Act, 21 U.S.C.section 360bbb-3(b)(1), unless the authorization is terminated  or revoked sooner.       Influenza A by PCR NEGATIVE NEGATIVE Final   Influenza B by PCR NEGATIVE NEGATIVE Final    Comment: (NOTE) The Xpert Xpress SARS-CoV-2/FLU/RSV plus assay is intended as an aid in the diagnosis of influenza from Nasopharyngeal swab specimens and should not be used as a sole basis for treatment. Nasal washings and aspirates are unacceptable for Xpert Xpress SARS-CoV-2/FLU/RSV testing.  Fact Sheet for Patients: BloggerCourse.com  Fact Sheet for Healthcare Providers: SeriousBroker.it  This test is not yet approved or cleared by the [place]  FDA and has been  authorized for detection and/or diagnosis of SARS-CoV-2 by FDA under an Emergency Use Authorization (EUA). This EUA will remain in effect (meaning this test can be used) for the duration of the COVID-19 declaration under Section 564(b)(1) of the Act, 21 U.S.C. section 360bbb-3(b)(1), unless the authorization is terminated or revoked.     Resp Syncytial Virus by PCR NEGATIVE NEGATIVE Final    Comment: (NOTE) Fact Sheet for Patients: BloggerCourse.com  Fact Sheet for Healthcare Providers: SeriousBroker.it  This test is not yet approved or cleared by the [place]  FDA and has been authorized for detection and/or diagnosis of SARS-CoV-2 by FDA under an Emergency Use Authorization (EUA). This EUA will remain in effect (meaning this test can be used) for the duration of the COVID-19 declaration under Section 564(b)(1) of the Act, 21 U.S.C. section 360bbb-3(b)(1), unless the authorization is terminated or revoked.  Performed at [facility], [address], [place], [place] [address]   Blood Culture (routine x 2)     Status: None (Preliminary result)   Collection Time: 02/04/24  1:20 AM   Specimen: BLOOD  Result Value Ref Range Status   Specimen Description   Final    BLOOD BLOOD RIGHT ARM Performed at [facility], [address], [place], [place] [address]    Special Requests   Final    BOTTLES DRAWN AEROBIC ONLY Blood Culture adequate volume Performed at [facility], [address] [place], [place] [address]    Culture   Final    NO GROWTH 3 DAYS Performed at [facility], [address] [place], [place] [address]    Report Status PENDING  Incomplete  Respiratory (~20 pathogens) panel by PCR     Status: None   Collection Time: 02/04/24  8:34 AM   Specimen: Nasopharyngeal Swab; Respiratory  Result Value Ref Range Status   Adenovirus NOT DETECTED NOT  DETECTED Final   Coronavirus 229E NOT DETECTED NOT DETECTED Final    Comment: (NOTE) The Coronavirus on the Respiratory Panel, DOES NOT test for the novel  Coronavirus (2019 nCoV)    Coronavirus HKU1 NOT DETECTED NOT DETECTED Final   Coronavirus NL63 NOT DETECTED NOT DETECTED Final   Coronavirus OC43 NOT DETECTED NOT DETECTED Final   Metapneumovirus NOT DETECTED NOT DETECTED Final   Rhinovirus / Enterovirus NOT DETECTED NOT DETECTED Final   Influenza A NOT DETECTED NOT DETECTED Final   Influenza B NOT DETECTED NOT DETECTED Final   Parainfluenza Virus 1 NOT DETECTED NOT DETECTED Final   Parainfluenza Virus 2 NOT DETECTED NOT DETECTED Final   Parainfluenza Virus 3 NOT DETECTED NOT DETECTED Final   Parainfluenza Virus 4 NOT DETECTED NOT DETECTED Final   Respiratory Syncytial Virus NOT DETECTED NOT DETECTED Final   Bordetella pertussis NOT DETECTED NOT DETECTED  Final   Bordetella Parapertussis NOT DETECTED NOT DETECTED Final   Chlamydophila pneumoniae NOT DETECTED NOT DETECTED Final   Mycoplasma pneumoniae NOT DETECTED NOT DETECTED Final    Comment: Performed at [facility], [address] [place], [place] [address]     Labs: Basic Metabolic Panel: Recent Labs  Lab 02/02/24 1314 02/03/24 1620 02/04/24 0448 02/05/24 0427 02/06/24 0508 02/07/24 0456 02/08/24 0411  NA 136   < > 135 135 136 138 138  K 4.1   < > 4.4 4.2 3.7 3.6 3.7  CL 97*   < > 94* 92* 87* 88* 89*  CO2 31   < > 31 33* 38* 40* 40*  GLUCOSE 180*   < > 136* 162* 149* 133* 134*  BUN 15   < > 17 20 16 16 18   CREATININE 1.08   < > 1.15 0.99 0.95 0.87 0.94  CALCIUM  8.8*   < > 8.4* 8.4* 8.8* 8.8* 8.9  MG 1.6*  --   --  2.2  --   --   --   PHOS  --   --   --  2.6  --   --   --    < > = values in this interval not displayed.   Liver Function Tests: Recent Labs  Lab 02/02/24 1314 02/03/24 1620 02/04/24 0448  AST 24 24 25   ALT 22 25 31   ALKPHOS 68 61 69  BILITOT 1.7* 2.1* 2.1*  PROT 8.5* 7.6 8.0   ALBUMIN 4.0 3.3* 3.3*   No results for input(s): "LIPASE", "AMYLASE" in the last 168 hours. No results for input(s): "AMMONIA" in the last 168 hours. CBC: Recent Labs  Lab 02/02/24 1314 02/03/24 1620 02/04/24 0448 02/05/24 0427 02/06/24 0508 02/07/24 0456 02/08/24 0411  WBC 28.2* 45.5* 48.0* 37.0* 27.6* 30.6* 33.4*  NEUTROABS 24.4* 37.1*  --   --   --   --   --   HGB 14.3 13.9 13.7 12.6* 13.5 12.8* 12.4*  HCT 46.5 44.6 45.2 42.3 44.8 42.4 41.3  MCV 93.2 92.9 94.6 95.7 96.6 94.2 93.7  PLT 247 244 256 250 268 306 349   Cardiac Enzymes: No results for input(s): "CKTOTAL", "CKMB", "CKMBINDEX", "TROPONINI" in the last 168 hours. BNP: BNP (last 3 results) Recent Labs    02/02/24 1314 02/03/24 1620  BNP 95.8 143.7*    ProBNP (last 3 results) No results for input(s): "PROBNP" in the last 8760 hours.  CBG: Recent Labs  Lab 02/07/24 2025  GLUCAP 148*       Signed:  Raymonde Calico MD.  [facility] 02/08/2024, 11:07 AM

## 2024-02-09 LAB — CULTURE, BLOOD (ROUTINE X 2)
Culture: NO GROWTH
Culture: NO GROWTH
Special Requests: ADEQUATE
Special Requests: ADEQUATE

## 2024-07-06 ENCOUNTER — Emergency Department (HOSPITAL_COMMUNITY)

## 2024-07-06 ENCOUNTER — Emergency Department (HOSPITAL_COMMUNITY)
Admission: EM | Admit: 2024-07-06 | Discharge: 2024-07-06 | Disposition: A | Discharge status: Home / Self Care | Attending: Emergency Medicine | Admitting: Emergency Medicine

## 2024-07-06 ENCOUNTER — Other Ambulatory Visit: Payer: Self-pay

## 2024-07-06 DIAGNOSIS — Z79899 Other long term (current) drug therapy: Secondary | ICD-10-CM | POA: Insufficient documentation

## 2024-07-06 DIAGNOSIS — K2970 Gastritis, unspecified, without bleeding: Secondary | ICD-10-CM | POA: Diagnosis not present

## 2024-07-06 DIAGNOSIS — Z7951 Long term (current) use of inhaled steroids: Secondary | ICD-10-CM | POA: Diagnosis not present

## 2024-07-06 DIAGNOSIS — I110 Hypertensive heart disease with heart failure: Secondary | ICD-10-CM | POA: Diagnosis not present

## 2024-07-06 DIAGNOSIS — I5032 Chronic diastolic (congestive) heart failure: Secondary | ICD-10-CM | POA: Diagnosis not present

## 2024-07-06 DIAGNOSIS — R1013 Epigastric pain: Secondary | ICD-10-CM | POA: Diagnosis present

## 2024-07-06 DIAGNOSIS — J4489 Other specified chronic obstructive pulmonary disease: Secondary | ICD-10-CM | POA: Insufficient documentation

## 2024-07-06 LAB — URINALYSIS, ROUTINE W REFLEX MICROSCOPIC
Bilirubin Urine: NEGATIVE
Glucose, UA: NEGATIVE mg/dL
Hgb urine dipstick: NEGATIVE
Ketones, ur: NEGATIVE mg/dL
Leukocytes,Ua: NEGATIVE
Nitrite: NEGATIVE
Protein, ur: NEGATIVE mg/dL
Specific Gravity, Urine: >1.046 — ABNORMAL HIGH (ref 1.005–1.030)
pH: 5 (ref 5.0–8.0)

## 2024-07-06 LAB — COMPREHENSIVE METABOLIC PANEL WITH GFR
ALT: 24 U/L (ref 0–44)
AST: 24 U/L (ref 15–41)
Albumin: 3.9 g/dL (ref 3.5–5.0)
Alkaline Phosphatase: 59 U/L (ref 38–126)
Anion gap: 9 (ref 5–15)
BUN: 14 mg/dL (ref 6–20)
CO2: 25 mmol/L (ref 22–32)
Calcium: 9 mg/dL (ref 8.9–10.3)
Chloride: 102 mmol/L (ref 98–111)
Creatinine, Ser: 0.92 mg/dL (ref 0.61–1.24)
GFR, Estimated: >60 mL/min (ref >60)
Glucose, Bld: 123 mg/dL — ABNORMAL HIGH (ref 70–99)
Potassium: 3.8 mmol/L (ref 3.5–5.1)
Sodium: 136 mmol/L (ref 135–145)
Total Bilirubin: 1.5 mg/dL — ABNORMAL HIGH (ref 0.0–1.2)
Total Protein: 8.6 g/dL — ABNORMAL HIGH (ref 6.5–8.1)

## 2024-07-06 LAB — CBC WITH DIFFERENTIAL/PLATELET
Abs Immature Granulocytes: 0.15 K/uL — ABNORMAL HIGH (ref 0.00–0.07)
Basophils Absolute: 0.1 K/uL (ref 0.0–0.1)
Basophils Relative: 0 %
Eosinophils Absolute: 0.3 K/uL (ref 0.0–0.5)
Eosinophils Relative: 2 %
HCT: 47.4 % (ref 39.0–52.0)
Hemoglobin: 14.9 g/dL (ref 13.0–17.0)
Immature Granulocytes: 1 %
Lymphocytes Relative: 9 %
Lymphs Abs: 1.8 K/uL (ref 0.7–4.0)
MCH: 28.6 pg (ref 26.0–34.0)
MCHC: 31.4 g/dL (ref 30.0–36.0)
MCV: 91 fL (ref 80.0–100.0)
Monocytes Absolute: 0.8 K/uL (ref 0.1–1.0)
Monocytes Relative: 4 %
Neutro Abs: 16.8 K/uL — ABNORMAL HIGH (ref 1.7–7.7)
Neutrophils Relative %: 84 %
Platelets: 330 K/uL (ref 150–400)
RBC: 5.21 MIL/uL (ref 4.22–5.81)
RDW: 13.2 % (ref 11.5–15.5)
WBC: 20 K/uL — ABNORMAL HIGH (ref 4.0–10.5)
nRBC: 0 % (ref 0.0–0.2)

## 2024-07-06 LAB — CBC
HCT: 47.5 % (ref 39.0–52.0)
Hemoglobin: 15.2 g/dL (ref 13.0–17.0)
MCH: 29.1 pg (ref 26.0–34.0)
MCHC: 32 g/dL (ref 30.0–36.0)
MCV: 90.8 fL (ref 80.0–100.0)
Platelets: 319 K/uL (ref 150–400)
RBC: 5.23 MIL/uL (ref 4.22–5.81)
RDW: 13.2 % (ref 11.5–15.5)
WBC: 17.3 K/uL — ABNORMAL HIGH (ref 4.0–10.5)
nRBC: 0 % (ref 0.0–0.2)

## 2024-07-06 LAB — TROPONIN I (HIGH SENSITIVITY): Troponin I (High Sensitivity): 3 ng/L (ref <18)

## 2024-07-06 LAB — LIPASE, BLOOD: Lipase: 55 U/L — ABNORMAL HIGH (ref 11–51)

## 2024-07-06 MED ORDER — OXYCODONE HCL 5 MG PO TABS: 5.0000 mg | ORAL_TABLET | ORAL | 0 refills | Status: AC | PRN

## 2024-07-06 MED ORDER — ONDANSETRON HCL 4 MG/2ML IJ SOLN
4.0000 mg | Freq: Once | INTRAMUSCULAR | Status: AC
  Administered 2024-07-06: 4 mg via INTRAVENOUS
  Filled 2024-07-06: qty 2

## 2024-07-06 MED ORDER — PANTOPRAZOLE SODIUM 40 MG PO TBEC: 40.0000 mg | DELAYED_RELEASE_TABLET | Freq: Every day | ORAL | 0 refills | Status: AC

## 2024-07-06 MED ORDER — IPRATROPIUM-ALBUTEROL 0.5-2.5 (3) MG/3ML IN SOLN
3.0000 mL | Freq: Once | RESPIRATORY_TRACT | Status: AC
  Administered 2024-07-06: 3 mL via RESPIRATORY_TRACT
  Filled 2024-07-06: qty 3

## 2024-07-06 MED ORDER — ALUM & MAG HYDROXIDE-SIMETH 200-200-20 MG/5ML PO SUSP
30.0000 mL | Freq: Once | ORAL | Status: AC
  Administered 2024-07-06: 30 mL via ORAL
  Filled 2024-07-06: qty 30

## 2024-07-06 MED ORDER — LIDOCAINE VISCOUS HCL 2 % MT SOLN
15.0000 mL | Freq: Once | OROMUCOSAL | Status: AC
  Administered 2024-07-06: 15 mL via ORAL
  Filled 2024-07-06: qty 15

## 2024-07-06 MED ORDER — PANTOPRAZOLE SODIUM 40 MG IV SOLR
40.0000 mg | Freq: Once | INTRAVENOUS | Status: AC
  Administered 2024-07-06: 40 mg via INTRAVENOUS
  Filled 2024-07-06: qty 10

## 2024-07-06 MED ORDER — MAALOX MAX 400-400-40 MG/5ML PO SUSP: 15.0000 mL | Freq: Four times a day (QID) | ORAL | 0 refills | Status: AC | PRN

## 2024-07-06 MED ORDER — FENTANYL CITRATE PF 50 MCG/ML IJ SOSY
50.0000 ug | PREFILLED_SYRINGE | Freq: Once | INTRAMUSCULAR | Status: AC
  Administered 2024-07-06: 50 ug via INTRAVENOUS
  Filled 2024-07-06: qty 1

## 2024-07-06 MED ORDER — IOHEXOL 300 MG/ML  SOLN
100.0000 mL | Freq: Once | INTRAMUSCULAR | Status: AC | PRN
  Administered 2024-07-06: 100 mL via INTRAVENOUS

## 2024-07-06 MED ORDER — ONDANSETRON 4 MG PO TBDP
4.0000 mg | ORAL_TABLET | Freq: Three times a day (TID) | ORAL | 0 refills | Status: AC | PRN
Start: 2024-07-06 — End: ?

## 2024-07-06 NOTE — ED Triage Notes (Signed)
 Pt BIBA from home for non-radiating abd pain just above navel starting Friday that was intermittent but yesterday started back and did not get better. Hx gerd, tried gas x with no relief. Similar episode about a year ago. En route started having heartburn and nausea. Denies urinary sx  160/100 Cbg 89 Hr 80s 96% ra

## 2024-07-06 NOTE — Discharge Instructions (Addendum)
 Follow-up with a gastroenterologist.  Follow-up with your primary care doctor.  Short course of pain medicine, Protonix , Maalox sent into the pharmacy for you.  Nausea medicine sent in as well.  Return for any emergent symptoms. CT scan, right upper quadrant ultrasound did not show any concerns.

## 2024-07-06 NOTE — ED Provider Notes (Cosign Needed Addendum)
 [place] EMERGENCY DEPARTMENT AT [facility] Provider Note   CSN: [id number] Arrival date & time: 07/06/24  9189     Patient presents with: Abdominal Pain   Joseph Morales is a 53 y.o. male.   53 year old male presents today for concern of epigastric abdominal pain that started on Friday.  Has been intermittent but has become worse today.  Endorses some nausea but no vomiting.  Has history of acid reflux.  Endorses taking his medicine but does not recall what he takes.  He did not take it today.  The history is provided by the patient. No language interpreter was used.       Prior to Admission medications   Medication Sig Start Date End Date Taking? Authorizing Provider  budesonide -formoterol  (SYMBICORT ) 160-4.5 MCG/ACT inhaler INHALE 2 PUFFS INTO THE LUNGS IN THE MORNING AND AT BEDTIME. 06/18/21 02/04/24  Hope Almarie ORN, NP  carvedilol  (COREG ) 12.5 MG tablet Take 0.5 tablets (6.25 mg total) by mouth 2 (two) times daily. Patient taking differently: Take 25 mg by mouth 2 (two) times daily. 05/31/22   Cheryle Page, MD  clobetasol cream (TEMOVATE) 0.05 % Apply 1 Application topically daily as needed (eczema). 05/15/22   [provider]  [employer] MELATONIN 5 MG TABS Take 5 mg by mouth at bedtime as needed (sleep). 05/15/22   [provider]  fluticasone  (FLONASE ) 50 MCG/ACT nasal spray Place 1 spray into both nostrils daily as needed for allergies. 05/15/22   [provider]  furosemide  (LASIX ) 40 MG tablet Take 80 mg by mouth 2 (two) times daily. 08/05/21   [provider]  losartan  (COZAAR ) 25 MG tablet Take 25 mg by mouth daily. 12/16/21   [provider]  OXYGEN Inhale 2-4 L into the lungs at bedtime.    [provider]    Allergies: Patient has no known allergies.    Review of Systems  Constitutional:  Negative for chills and fever.  Respiratory:  Negative for shortness of breath.   Cardiovascular:  Negative for  chest pain.  Gastrointestinal:  Positive for abdominal pain and nausea. Negative for vomiting.  Neurological:  Negative for light-headedness.  All other systems reviewed and are negative.   Updated Vital Signs BP 115/68 (BP Location: Right Arm)   Pulse 78   Temp 97.6 F (36.4 C) (Oral)   Resp 18   Ht 6' 3 (1.905 m)   Wt (!) 154.2 kg   SpO2 94%   BMI 42.50 kg/m   Physical Exam Vitals and nursing note reviewed.  Constitutional:      General: He is not in acute distress.    Appearance: Normal appearance. He is not ill-appearing.  HENT:     Head: Normocephalic and atraumatic.     Nose: Nose normal.  Eyes:     Conjunctiva/sclera: Conjunctivae normal.  Cardiovascular:     Rate and Rhythm: Normal rate and regular rhythm.  Pulmonary:     Effort: Pulmonary effort is normal. No respiratory distress.     Breath sounds: Wheezing present. No rales.  Abdominal:     General: There is no distension.     Tenderness: There is abdominal tenderness. There is no guarding.  Musculoskeletal:        General: No deformity. Normal range of motion.     Cervical back: Normal range of motion.  Skin:    Findings: No rash.  Neurological:     Mental Status: He is alert.     (all labs  ordered are listed, but only abnormal results are displayed) Labs Reviewed  LIPASE, BLOOD - Abnormal; Notable for the following components:      Result Value   Lipase 55 (*)    All other components within normal limits  COMPREHENSIVE METABOLIC PANEL WITH GFR - Abnormal; Notable for the following components:   Glucose, Bld 123 (*)    Total Protein 8.6 (*)    Total Bilirubin 1.5 (*)    All other components within normal limits  CBC - Abnormal; Notable for the following components:   WBC 17.3 (*)    All other components within normal limits  URINALYSIS, ROUTINE W REFLEX MICROSCOPIC - Abnormal; Notable for the following components:   Specific Gravity, Urine >1.046 (*)    All other components within normal  limits  CBC WITH DIFFERENTIAL/PLATELET - Abnormal; Notable for the following components:   WBC 20.0 (*)    Neutro Abs 16.8 (*)    Abs Immature Granulocytes 0.15 (*)    All other components within normal limits  TROPONIN I (HIGH SENSITIVITY)  TROPONIN I (HIGH SENSITIVITY)    EKG: EKG Interpretation Date/Time:  Wednesday July 06 2024 09:04:14 EDT Ventricular Rate:  84 PR Interval:  155 QRS Duration:  112 QT Interval:  354 QTC Calculation: 419 R Axis:   74  Text Interpretation: Sinus rhythm Incomplete right bundle branch block Since prior ECG, rate has decreased Confirmed by Dreama Longs ([id number]) on 07/06/2024 1:08:14 PM  Radiology: US  Abdomen Limited RUQ (LIVER/GB) Result Date: 07/06/2024 CLINICAL DATA:  [id number] Epigastric pain 114842 EXAM: ULTRASOUND ABDOMEN LIMITED RIGHT UPPER QUADRANT COMPARISON:  CT scan abdomen and pelvis from earlier the same day. FINDINGS: Gallbladder: No gallstones or wall thickening visualized. No sonographic Murphy sign noted by sonographer. Common bile duct: Diameter: Up to 4 mm.  No intrahepatic bile duct dilation. Liver: There is poor sound beam penetration to the deep / posterior aspects of the liver as a result of increased hepatic echogenicity which reduces the sensitivity of ultrasound for the detection of focal masses. That being said, no focal mass is identified. Portal vein is patent on color Doppler imaging with normal direction of blood flow towards the liver. Other: None. IMPRESSION: 1. Increased hepatic echogenicity, a nonspecific finding that is most commonly seen on the basis of steatosis in the absence of known liver disease. 2. Otherwise unremarkable exam. Electronically Signed   By: Ree Molt M.D.   On: 07/06/2024 14:48   CT ABDOMEN PELVIS W CONTRAST Result Date: 07/06/2024 CLINICAL DATA:  Abdominal pain, acute, nonlocalized. EXAM: CT ABDOMEN AND PELVIS WITH CONTRAST TECHNIQUE: Multidetector CT imaging of the abdomen and pelvis was performed  using the standard protocol following bolus administration of intravenous contrast. RADIATION DOSE REDUCTION: This exam was performed according to the departmental dose-optimization program which includes automated exposure control, adjustment of the mA and/or kV according to patient size and/or use of iterative reconstruction technique. CONTRAST:  OMNIPAQUE  IOHEXOL  300 MG/ML  SOLN COMPARISON:  None Available. FINDINGS: Lower chest: There are patchy atelectatic changes at the left lung base with associated small left pleural effusion. Bilateral lungs are otherwise clear. No overt consolidation. No right pleural effusion. The heart is normal in size. No pericardial effusion. Hepatobiliary: The liver is normal in size. Non-cirrhotic configuration. No suspicious mass. These is mild diffuse hepatic steatosis. No intrahepatic or extrahepatic bile duct dilation. No calcified gallstones. Normal gallbladder wall thickness. No pericholecystic inflammatory changes. Pancreas: Unremarkable. No pancreatic ductal dilatation or surrounding inflammatory changes. Spleen:  Within normal limits. No focal lesion. Adrenals/Urinary Tract: Adrenal glands are unremarkable. No suspicious renal mass. There is a 2.5 x 2.6 cm simple cyst arising from the left kidney upper pole, anteriorly. No nephroureterolithiasis or obstructive uropathy. Urinary bladder is under distended, precluding optimal assessment. However, no large mass or stones identified. No perivesical fat stranding. Stomach/Bowel: No disproportionate dilation of the small or large bowel loops. No evidence of abnormal bowel wall thickening or inflammatory changes. The appendix is unremarkable. Vascular/Lymphatic: No ascites or pneumoperitoneum. No abdominal or pelvic lymphadenopathy, by size criteria. No aneurysmal dilation of the major abdominal arteries. Reproductive: Normal size prostate. Symmetric seminal vesicles. Other: There are small fat containing umbilical and left  inguinal hernias. The soft tissues and abdominal wall are otherwise unremarkable. Musculoskeletal: No suspicious osseous lesions. IMPRESSION: 1. No acute inflammatory process identified within the abdomen or pelvis. 2. Small left pleural effusion and atelectatic changes at the left lung base. 3. Otherwise essentially unremarkable exam, as described above. Electronically Signed   By: Ree Molt M.D.   On: 07/06/2024 10:18   DG Chest Portable 1 View Result Date: 07/06/2024 CLINICAL DATA:  dyspnea EXAM: PORTABLE CHEST - 1 VIEW COMPARISON:  February 07, 2024 FINDINGS: No focal airspace consolidation, pleural effusion, or pneumothorax. No cardiomegaly.No acute fracture or destructive lesion. IMPRESSION: No acute cardiopulmonary abnormality. Electronically Signed   By: Rogelia Myers M.D.   On: 07/06/2024 09:58     Procedures   Medications Ordered in the ED  ipratropium-albuterol  (DUONEB) 0.5-2.5 (3) MG/3ML nebulizer solution 3 mL (3 mLs Nebulization Given 07/06/24 0939)  pantoprazole  (PROTONIX ) injection 40 mg (40 mg Intravenous Given 07/06/24 0939)  alum & mag hydroxide-simeth (MAALOX/MYLANTA) 200-200-20 MG/5ML suspension 30 mL (30 mLs Oral Given 07/06/24 0939)  iohexol  (OMNIPAQUE ) 300 MG/ML solution 100 mL (100 mLs Intravenous Contrast Given 07/06/24 0954)  alum & mag hydroxide-simeth (MAALOX/MYLANTA) 200-200-20 MG/5ML suspension 30 mL (30 mLs Oral Given 07/06/24 1346)    And  lidocaine  (XYLOCAINE ) 2 % viscous mouth solution 15 mL (15 mLs Oral Given 07/06/24 1346)  fentaNYL  (SUBLIMAZE ) injection 50 mcg (50 mcg Intravenous Given 07/06/24 1346)  ondansetron  (ZOFRAN ) injection 4 mg (4 mg Intravenous Given 07/06/24 1346)    Clinical Course as of 07/06/24 1504  Wed Jul 06, 2024  1219 Ongoing pain.  CT without acute concern for intra-abdominal process.  CBC does show leukocytosis of 17,000.  CMP does not show acute concern.  Lipase 55. Will provide additional GI cocktail, pain control. [AA]    Clinical Course  User Index [AA] Hildegard Loge, PA-C                                 Medical Decision Making Amount and/or Complexity of Data Reviewed Labs: ordered. Radiology: ordered.  Risk OTC drugs. Prescription drug management.   Medical Decision Making / ED Course   This patient presents to the ED for concern of abdominal pain, this involves an extensive number of treatment options, and is a complaint that carries with it a high risk of complications and morbidity.  The differential diagnosis includes gastritis, cholecystitis, gastritis, colitis, diverticulitis, UTI, pyelonephritis  MDM: 53 year old male presents today for for concern of abdominal pain.  History of gastritis and acid reflux.  Reports compliance with PPI.  CBC with leukocytosis with mild left shift but afebrile.  No anemia.  CMP without acute concern.  UA without evidence of UTI.  He is not coughing.  Chest x-ray without pneumonia or other acute cardiopulmonary process.  CT abdomen pelvis without acute intra-abdominal process.  Right upper quadrant ultrasound does not show any gallstones.  Does show hepatic steatosis.  Will give GI referral. Leukocytosis likely reactive.  Will give symptomatic management with pain medicine, Maalox, and Zofran . Stable for discharge. Discharged in stable condition.  Given heart history cardiac labs were obtained.  These were all reassuring.  EKG without acute ischemic change.  Low suspicion for ACS.  Lab Tests: -I ordered, reviewed, and interpreted labs.   The pertinent results include:   Labs Reviewed  LIPASE, BLOOD - Abnormal; Notable for the following components:      Result Value   Lipase 55 (*)    All other components within normal limits  COMPREHENSIVE METABOLIC PANEL WITH GFR - Abnormal; Notable for the following components:   Glucose, Bld 123 (*)    Total Protein 8.6 (*)    Total Bilirubin 1.5 (*)    All other components within normal limits  CBC - Abnormal; Notable for the  following components:   WBC 17.3 (*)    All other components within normal limits  URINALYSIS, ROUTINE W REFLEX MICROSCOPIC - Abnormal; Notable for the following components:   Specific Gravity, Urine >1.046 (*)    All other components within normal limits  CBC WITH DIFFERENTIAL/PLATELET - Abnormal; Notable for the following components:   WBC 20.0 (*)    Neutro Abs 16.8 (*)    Abs Immature Granulocytes 0.15 (*)    All other components within normal limits  TROPONIN I (HIGH SENSITIVITY)  TROPONIN I (HIGH SENSITIVITY)      EKG  EKG Interpretation Date/Time:  Wednesday July 06 2024 09:04:14 EDT Ventricular Rate:  84 PR Interval:  155 QRS Duration:  112 QT Interval:  354 QTC Calculation: 419 R Axis:   74  Text Interpretation: Sinus rhythm Incomplete right bundle branch block Since prior ECG, rate has decreased Confirmed by Dreama Longs ([id number]) on 07/06/2024 1:08:14 PM         Imaging Studies ordered: I ordered imaging studies including CT abdomen pelvis with contrast, chest x-ray, right upper quadrant ultrasound I independently visualized and interpreted imaging. I agree with the radiologist interpretation   Medicines ordered and prescription drug management: Meds ordered this encounter  Medications   ipratropium-albuterol  (DUONEB) 0.5-2.5 (3) MG/3ML nebulizer solution 3 mL   pantoprazole  (PROTONIX ) injection 40 mg   alum & mag hydroxide-simeth (MAALOX/MYLANTA) 200-200-20 MG/5ML suspension 30 mL   iohexol  (OMNIPAQUE ) 300 MG/ML solution 100 mL   AND Linked Order Group    alum & mag hydroxide-simeth (MAALOX/MYLANTA) 200-200-20 MG/5ML suspension 30 mL    lidocaine  (XYLOCAINE ) 2 % viscous mouth solution 15 mL   fentaNYL  (SUBLIMAZE ) injection 50 mcg   ondansetron  (ZOFRAN ) injection 4 mg    -I have reviewed the patients home medicines and have made adjustments as needed   Reevaluation: After the interventions noted above, I reevaluated the patient and found that they  have :improved  Co morbidities that complicate the patient evaluation  Past Medical History:  Diagnosis Date   Asthma    CHF (congestive heart failure) (HCC)    Chronic diastolic heart failure (HCC)    COPD (chronic obstructive pulmonary disease) (HCC)    Hypertension    Hypoventilation associated with obesity syndrome (HCC)    Obesity       Dispostion: Discharged in stable condition.  Return precaution discussed.  Patient voices understanding and is in agreement with plan.  Final diagnoses:  Epigastric pain  Gastritis without bleeding, unspecified chronicity, unspecified gastritis type    ED Discharge Orders          Ordered    oxyCODONE  (ROXICODONE ) 5 MG immediate release tablet  Every 4 hours PRN        07/06/24 1517    ondansetron  (ZOFRAN -ODT) 4 MG disintegrating tablet  Every 8 hours PRN        07/06/24 1517    alum & mag hydroxide-simeth (MAALOX MAX) 400-400-40 MG/5ML suspension  Every 6 hours PRN        07/06/24 1517    pantoprazole  (PROTONIX ) 40 MG tablet  Daily        07/06/24 1517               Hildegard Loge, PA-C 07/06/24 1518    Hildegard Loge, PA-C 07/06/24 1519    Dreama Longs, MD 07/07/24 0022

## 2024-12-12 ENCOUNTER — Emergency Department (HOSPITAL_COMMUNITY)

## 2024-12-12 ENCOUNTER — Inpatient Hospital Stay (HOSPITAL_COMMUNITY)
Admission: EM | Admit: 2024-12-12 | Discharge: 2024-12-15 | DRG: 389 | Disposition: A | Discharge status: Home / Self Care | Attending: Internal Medicine | Admitting: Internal Medicine

## 2024-12-12 ENCOUNTER — Encounter (HOSPITAL_COMMUNITY): Payer: Self-pay

## 2024-12-12 ENCOUNTER — Other Ambulatory Visit: Payer: Self-pay

## 2024-12-12 DIAGNOSIS — Z803 Family history of malignant neoplasm of breast: Secondary | ICD-10-CM

## 2024-12-12 DIAGNOSIS — I5032 Chronic diastolic (congestive) heart failure: Secondary | ICD-10-CM | POA: Diagnosis present

## 2024-12-12 DIAGNOSIS — E662 Morbid (severe) obesity with alveolar hypoventilation: Secondary | ICD-10-CM | POA: Diagnosis present

## 2024-12-12 DIAGNOSIS — Z7951 Long term (current) use of inhaled steroids: Secondary | ICD-10-CM

## 2024-12-12 DIAGNOSIS — J4489 Other specified chronic obstructive pulmonary disease: Secondary | ICD-10-CM | POA: Diagnosis present

## 2024-12-12 DIAGNOSIS — Z825 Family history of asthma and other chronic lower respiratory diseases: Secondary | ICD-10-CM

## 2024-12-12 DIAGNOSIS — Z6841 Body Mass Index (BMI) 40.0 and over, adult: Secondary | ICD-10-CM

## 2024-12-12 DIAGNOSIS — Z7985 Long-term (current) use of injectable non-insulin antidiabetic drugs: Secondary | ICD-10-CM

## 2024-12-12 DIAGNOSIS — D72829 Elevated white blood cell count, unspecified: Secondary | ICD-10-CM | POA: Diagnosis present

## 2024-12-12 DIAGNOSIS — E119 Type 2 diabetes mellitus without complications: Secondary | ICD-10-CM | POA: Diagnosis present

## 2024-12-12 DIAGNOSIS — K56600 Partial intestinal obstruction, unspecified as to cause: Principal | ICD-10-CM | POA: Diagnosis present

## 2024-12-12 DIAGNOSIS — Z79899 Other long term (current) drug therapy: Secondary | ICD-10-CM

## 2024-12-12 DIAGNOSIS — Z87891 Personal history of nicotine dependence: Secondary | ICD-10-CM

## 2024-12-12 DIAGNOSIS — K56609 Unspecified intestinal obstruction, unspecified as to partial versus complete obstruction: Principal | ICD-10-CM | POA: Diagnosis present

## 2024-12-12 DIAGNOSIS — E785 Hyperlipidemia, unspecified: Secondary | ICD-10-CM | POA: Diagnosis present

## 2024-12-12 DIAGNOSIS — Z8249 Family history of ischemic heart disease and other diseases of the circulatory system: Secondary | ICD-10-CM

## 2024-12-12 DIAGNOSIS — I110 Hypertensive heart disease with heart failure: Secondary | ICD-10-CM | POA: Diagnosis present

## 2024-12-12 DIAGNOSIS — Z833 Family history of diabetes mellitus: Secondary | ICD-10-CM

## 2024-12-12 LAB — CBC
HCT: 52.8 % — ABNORMAL HIGH (ref 39.0–52.0)
Hemoglobin: 16.8 g/dL (ref 13.0–17.0)
MCH: 29.4 pg (ref 26.0–34.0)
MCHC: 31.8 g/dL (ref 30.0–36.0)
MCV: 92.3 fL (ref 80.0–100.0)
Platelets: 328 K/uL (ref 150–400)
RBC: 5.72 MIL/uL (ref 4.22–5.81)
RDW: 12.9 % (ref 11.5–15.5)
WBC: 27.1 K/uL — ABNORMAL HIGH (ref 4.0–10.5)
nRBC: 0 % (ref 0.0–0.2)

## 2024-12-12 LAB — COMPREHENSIVE METABOLIC PANEL WITH GFR
ALT: 16 U/L (ref 0–44)
AST: 23 U/L (ref 15–41)
Albumin: 4.5 g/dL (ref 3.5–5.0)
Alkaline Phosphatase: 87 U/L (ref 38–126)
Anion gap: 12 (ref 5–15)
BUN: 19 mg/dL (ref 6–20)
CO2: 28 mmol/L (ref 22–32)
Calcium: 9.7 mg/dL (ref 8.9–10.3)
Chloride: 99 mmol/L (ref 98–111)
Creatinine, Ser: 1.09 mg/dL (ref 0.61–1.24)
GFR, Estimated: >60 mL/min (ref >60)
Glucose, Bld: 121 mg/dL — ABNORMAL HIGH (ref 70–99)
Potassium: 4.6 mmol/L (ref 3.5–5.1)
Sodium: 139 mmol/L (ref 135–145)
Total Bilirubin: 1.2 mg/dL (ref 0.0–1.2)
Total Protein: 8.6 g/dL — ABNORMAL HIGH (ref 6.5–8.1)

## 2024-12-12 LAB — TROPONIN T, HIGH SENSITIVITY
Troponin T High Sensitivity: <15 ng/L (ref 0–19)
Troponin T High Sensitivity: <15 ng/L (ref 0–19)

## 2024-12-12 MED ORDER — LACTATED RINGERS IV BOLUS
1000.0000 mL | Freq: Once | INTRAVENOUS | Status: AC
  Administered 2024-12-12: 15:00:00 1000 mL via INTRAVENOUS

## 2024-12-12 MED ORDER — HYDROMORPHONE HCL 1 MG/ML IJ SOLN
0.5000 mg | INTRAMUSCULAR | Status: DC | PRN
  Administered 2024-12-12: 20:00:00 1 mg via INTRAVENOUS
  Filled 2024-12-12: qty 1

## 2024-12-12 MED ORDER — FAMOTIDINE IN NACL 20-0.9 MG/50ML-% IV SOLN
20.0000 mg | Freq: Once | INTRAVENOUS | Status: AC
  Administered 2024-12-12: 15:00:00 20 mg via INTRAVENOUS
  Filled 2024-12-12: qty 50

## 2024-12-12 MED ORDER — SODIUM CHLORIDE (PF) 0.9 % IJ SOLN
INTRAMUSCULAR | Status: AC
  Filled 2024-12-12: qty 50

## 2024-12-12 MED ORDER — HYDRALAZINE HCL 20 MG/ML IJ SOLN: 10.0000 mg | INTRAMUSCULAR | Status: DC | PRN

## 2024-12-12 MED ORDER — METOCLOPRAMIDE HCL 5 MG/ML IJ SOLN
10.0000 mg | Freq: Once | INTRAMUSCULAR | Status: AC
  Administered 2024-12-12: 15:00:00 10 mg via INTRAVENOUS
  Filled 2024-12-12: qty 2

## 2024-12-12 MED ORDER — ONDANSETRON HCL 4 MG PO TABS: 4.0000 mg | ORAL_TABLET | Freq: Four times a day (QID) | ORAL | Status: DC | PRN

## 2024-12-12 MED ORDER — DIATRIZOATE MEGLUMINE & SODIUM 66-10 % PO SOLN
90.0000 mL | Freq: Once | ORAL | Status: AC
  Administered 2024-12-13: 09:00:00 90 mL via ORAL
  Filled 2024-12-12: qty 90

## 2024-12-12 MED ORDER — IOHEXOL 300 MG/ML  SOLN
100.0000 mL | Freq: Once | INTRAMUSCULAR | Status: AC | PRN
  Administered 2024-12-12: 16:00:00 100 mL via INTRAVENOUS

## 2024-12-12 MED ORDER — ONDANSETRON HCL 4 MG/2ML IJ SOLN: 4.0000 mg | Freq: Four times a day (QID) | INTRAMUSCULAR | Status: DC | PRN

## 2024-12-12 NOTE — ED Provider Notes (Signed)
 [place] EMERGENCY DEPARTMENT AT [facility] Provider Note   CSN: [id number] Arrival date & time: 12/12/24  1230     Patient presents with: Abdominal Pain   Joseph Morales is a 53 y.o. male.   Patient is a 53 year old male with a history of CHF, asthma, hypertension and diabetes who is presenting today with complaints of abdominal pain, nausea vomiting and diarrhea.  Symptoms started yesterday afternoon and has gradually worsened.  He complains of significant abdominal pain diffusely but most prominent in the periumbilical area.  Also reports he is having a lot of burping and feels heartburn type symptoms.  He has had 3 total episodes of emesis and several of diarrhea but denies any bloody emesis or stool.  He has not had fever and denies known sick contacts or concern for foodborne illness.  Patient does report that he recently started back on Ozempic last Monday and does report he had similar symptoms in July of this year and had discontinued the Ozempic and did not have symptoms until now.  He denies any excessive alcohol use and states he last had a drink last Wednesday.  He denies any chest pain or shortness of breath at this time.  No prior abdominal surgeries  The history is provided by the patient and medical records.  Abdominal Pain      Prior to Admission medications  Medication Sig Start Date End Date Taking? Authorizing Provider  alum & mag hydroxide-simeth (MAALOX MAX) 400-400-40 MG/5ML suspension Take 15 mLs by mouth every 6 (six) hours as needed for indigestion. 07/06/24   Hildegard Loge, PA-C  budesonide -formoterol  (SYMBICORT ) 160-4.5 MCG/ACT inhaler INHALE 2 PUFFS INTO THE LUNGS IN THE MORNING AND AT BEDTIME. 06/18/21 02/04/24  Hope Almarie ORN, NP  carvedilol  (COREG ) 12.5 MG tablet Take 0.5 tablets (6.25 mg total) by mouth 2 (two) times daily. Patient taking differently: Take 25 mg by mouth 2 (two) times daily. 05/31/22   Cheryle Page, MD  clobetasol cream  (TEMOVATE) 0.05 % Apply 1 Application topically daily as needed (eczema). 05/15/22   [provider]  [employer] MELATONIN 5 MG TABS Take 5 mg by mouth at bedtime as needed (sleep). 05/15/22   [provider]  fluticasone  (FLONASE ) 50 MCG/ACT nasal spray Place 1 spray into both nostrils daily as needed for allergies. 05/15/22   [provider]  furosemide  (LASIX ) 40 MG tablet Take 80 mg by mouth 2 (two) times daily. 08/05/21   [provider]  losartan  (COZAAR ) 25 MG tablet Take 25 mg by mouth daily. 12/16/21   [provider]  ondansetron  (ZOFRAN -ODT) 4 MG disintegrating tablet Take 1 tablet (4 mg total) by mouth every 8 (eight) hours as needed. 07/06/24   Hildegard Loge, PA-C  oxyCODONE  (ROXICODONE ) 5 MG immediate release tablet Take 1 tablet (5 mg total) by mouth every 4 (four) hours as needed for severe pain (pain score 7-10). 07/06/24   Hildegard Loge, PA-C  OXYGEN Inhale 2-4 L into the lungs at bedtime.    [provider]  pantoprazole  (PROTONIX ) 40 MG tablet Take 1 tablet (40 mg total) by mouth daily. 07/06/24   Hildegard Loge, PA-C    Allergies: Patient has no known allergies.    Review of Systems  Gastrointestinal:  Positive for abdominal pain.    Updated Vital Signs BP (!) 207/120   Pulse 99   Temp 99 F (37.2 C) (Oral)   Resp 20   Ht 6' 3 (1.905 m)   [place] ROLLEN)  154.2 kg   SpO2 94%   BMI 42.50 kg/m   Physical Exam Vitals and nursing note reviewed.  Constitutional:      General: He is not in acute distress.    Appearance: He is well-developed.  HENT:     Head: Normocephalic and atraumatic.  Eyes:     Conjunctiva/sclera: Conjunctivae normal.     Pupils: Pupils are equal, round, and reactive to light.  Cardiovascular:     Rate and Rhythm: Regular rhythm. Tachycardia present.     Heart sounds: No murmur heard. Pulmonary:     Effort: Pulmonary effort is normal. No respiratory distress.     Breath sounds: Normal breath sounds. No wheezing or rales.   Abdominal:     General: There is no distension.     Palpations: Abdomen is soft.     Tenderness: There is abdominal tenderness in the right upper quadrant, epigastric area and periumbilical area. There is no guarding or rebound.  Musculoskeletal:        General: No tenderness. Normal range of motion.     Cervical back: Normal range of motion and neck supple.  Skin:    General: Skin is warm and dry.     Findings: No erythema or rash.  Neurological:     Mental Status: He is alert and oriented to person, place, and time.  Psychiatric:        Behavior: Behavior normal.     (all labs ordered are listed, but only abnormal results are displayed) Labs Reviewed  CBC  COMPREHENSIVE METABOLIC PANEL WITH GFR  TROPONIN T, HIGH SENSITIVITY    EKG: EKG Interpretation Date/Time:  Monday December 12 2024 13:13:34 EST Ventricular Rate:  99 PR Interval:  149 QRS Duration:  113 QT Interval:  335 QTC Calculation: 430 R Axis:   87  Text Interpretation: Sinus rhythm Incomplete right bundle branch block No significant change since last tracing Confirmed by Doretha Folks ([id number]) on 12/12/2024 2:17:59 PM  Radiology: DG Chest 2 View Result Date: 12/12/2024 CLINICAL DATA:  Abdominal pain EXAM: CHEST - 2 VIEW COMPARISON:  Chest radiograph dated 07/06/2024. FINDINGS: Left lung base linear atelectasis/scarring. No focal consolidation, pleural effusion or pneumothorax. The cardiac silhouette is within normal limits. No acute osseous pathology. IMPRESSION: No active cardiopulmonary disease. Electronically Signed   By: Vanetta Chou M.D.   On: 12/12/2024 14:08     Procedures   Medications Ordered in the ED  lactated ringers  bolus 1,000 mL (has no administration in time range)  metoCLOPramide  (REGLAN ) injection 10 mg (has no administration in time range)  famotidine  (PEPCID ) IVPB 20 mg premix (has no administration in time range)                                    Medical Decision  Making Amount and/or Complexity of Data Reviewed External Data Reviewed: notes. Labs: ordered. Decision-making details documented in ED Course. Radiology: ordered and independent interpretation performed. Decision-making details documented in ED Course. ECG/medicine tests: ordered and independent interpretation performed. Decision-making details documented in ED Course.  Risk Prescription drug management.   Pt with multiple medical problems and comorbidities and presenting today with a complaint that caries a high risk for morbidity and mortality.  Presenting today with complaint of abdominal pain.  Concern for possible reaction to the Ozempic, pancreatitis, hepatitis.  Lower suspicion for cholelithiasis as patient had a right upper quadrant ultrasound done in July  of this year which was negative for acute findings.  Lower suspicion for obstruction as patient has also had diarrhea.  Patient is hypertensive here but denying any chest pain or shortness of breath.  Did report that he took his blood pressure medication however unclear if he held it down.  Will treat with fluids and antiemetics as well as Pepcid .  Labs are pending.  I independently interpreted patient's labs and EKG.  EKG without acute findings.  CBC with a leukocytosis of 27, hemoglobin of 16, troponin is less than 15.  CMP is still pending.  I have independently visualized and interpreted pt's images today.  Chest x-ray is within normal limits.  Will need a CT for further evaluation.      Final diagnoses:  None    ED Discharge Orders     None          Doretha Folks, MD 12/12/24 1456

## 2024-12-12 NOTE — H&P (Signed)
 History and Physical    Joseph Morales FMW:[id number] DOB: 07/29/1971 DOA: 12/12/2024  I have briefly reviewed the patient's prior medical records in [facility]  PCP: Hollowell, Isaiah Fellows, MD  Patient coming from: home  Chief Complaint: Abdominal pain, nausea, vomiting  HPI: Joseph Morales is a 53 y.o. male with medical history significant of obesity, COPD, HTN, HLD, chronic diastolic CHF who comes into the hospital with complaints of abdominal pain.  Patient reports that has been having abdominal discomfort, nausea and vomiting since last night.  He reports that has been having to go to the bathroom few times thinking he was going to have a bowel movement but nothing came out.  Last BM was yesterday, he is not passing any gas.  He feels extremely bloated.  Last time he was vomiting was about the 3 AM this morning, has not had any vomiting since.  Given lack of improvement he decided to come to the ER.  He denies any fever or chills.  He reports a similar episode several months ago when he started on Ozempic, and has discontinued it since, but last Monday he decided to go back on it and took it again.  No chest pain, no shortness of breath.  ED Course: In the emergency room he is afebrile, initial blood pressure was elevated to 200 systolic but then settled in the 150s.  He is satting well on room air.  His blood work is fairly unremarkable except for a white count of 27.  CT scan of the abdomen and pelvis showed early or partial small bowel obstruction without any obstructing lesions identified.  We are asked to admit for partial SBO.  Review of Systems: All systems reviewed, and apart from HPI, all negative  Past Medical History:  Diagnosis Date   Asthma    CHF (congestive heart failure) (HCC)    Chronic diastolic heart failure (HCC)    COPD (chronic obstructive pulmonary disease) (HCC)    Hypertension    Hypoventilation associated with obesity syndrome (HCC)     Obesity     Past Surgical History:  Procedure Laterality Date   NO PAST SURGERIES       reports that he has quit smoking. His smoking use included cigarettes. He has a 20 pack-year smoking history. He has never used smokeless tobacco. He reports current drug use. Drug: Marijuana. He reports that he does not drink alcohol.  Allergies[1]  Family History  Problem Relation Age of Onset   Breast cancer Mother    Diabetes Mother    Emphysema Maternal Grandmother    Congestive Heart Failure Maternal Grandmother     Prior to Admission medications  Medication Sig Start Date End Date Taking? Authorizing Provider  alum & mag hydroxide-simeth (MAALOX MAX) 400-400-40 MG/5ML suspension Take 15 mLs by mouth every 6 (six) hours as needed for indigestion. 07/06/24   Hildegard Loge, PA-C  budesonide -formoterol  (SYMBICORT ) 160-4.5 MCG/ACT inhaler INHALE 2 PUFFS INTO THE LUNGS IN THE MORNING AND AT BEDTIME. 06/18/21 02/04/24  Hope Almarie ORN, NP  carvedilol  (COREG ) 12.5 MG tablet Take 0.5 tablets (6.25 mg total) by mouth 2 (two) times daily. Patient taking differently: Take 25 mg by mouth 2 (two) times daily. 05/31/22   Cheryle Page, MD  clobetasol cream (TEMOVATE) 0.05 % Apply 1 Application topically daily as needed (eczema). 05/15/22   [provider]  [facility] MELATONIN 5 MG TABS Take 5 mg by mouth at bedtime as needed (sleep). 05/15/22   [provider]  fluticasone  (FLONASE ) 50 MCG/ACT nasal spray Place 1 spray into both nostrils daily as needed for allergies. 05/15/22   [provider]  furosemide  (LASIX ) 40 MG tablet Take 80 mg by mouth 2 (two) times daily. 08/05/21   [provider]  losartan  (COZAAR ) 25 MG tablet Take 25 mg by mouth daily. 12/16/21   [provider]  ondansetron  (ZOFRAN -ODT) 4 MG disintegrating tablet Take 1 tablet (4 mg total) by mouth every 8 (eight) hours as needed. 07/06/24   Hildegard Loge, PA-C  oxyCODONE  (ROXICODONE ) 5 MG immediate release tablet  Take 1 tablet (5 mg total) by mouth every 4 (four) hours as needed for severe pain (pain score 7-10). 07/06/24   Hildegard Loge, PA-C  OXYGEN Inhale 2-4 L into the lungs at bedtime.    [provider]  pantoprazole  (PROTONIX ) 40 MG tablet Take 1 tablet (40 mg total) by mouth daily. 07/06/24   Hildegard Loge, PA-C    Physical Exam: Vitals:   12/12/24 1308 12/12/24 1448  BP: (!) 207/120 (!) 159/94  Pulse: 99 91  Resp: 20 16  Temp: 99 F (37.2 C) 97.7 F (36.5 C)  TempSrc: Oral Oral  SpO2: 94% 97%  Weight: (!) 154.2 kg   Height: 6' 3 (1.905 m)     Constitutional: NAD, calm, comfortable Eyes: PERRL, lids and conjunctivae normal ENMT: Mucous membranes are moist.   Neck: normal, supple Respiratory: clear to auscultation bilaterally, no wheezing, no crackles. Normal respiratory effort.  Cardiovascular: Regular rate and rhythm, no murmurs / rubs / gallops. No extremity edema.  Abdomen: Distended, mildly tender to palpation, no guarding or rebound.  Bowel sounds diminished Musculoskeletal: no clubbing / cyanosis. Normal muscle tone.  Skin: no rashes, lesions, ulcers. No induration Neurologic: CN 2-12 grossly intact. Strength 5/5 in all 4.  Psychiatric: Normal judgment and insight. Alert and oriented x 3. Normal mood.   Labs on Admission: I have personally reviewed following labs and imaging studies  CBC: Recent Labs  Lab 12/12/24 1411  WBC 27.1*  HGB 16.8  HCT 52.8*  MCV 92.3  PLT 328   Basic Metabolic Panel: Recent Labs  Lab 12/12/24 1412  NA 139  K 4.6  CL 99  CO2 28  GLUCOSE 121*  BUN 19  CREATININE 1.09  CALCIUM  9.7   Liver Function Tests: Recent Labs  Lab 12/12/24 1412  AST 23  ALT 16  ALKPHOS 87  BILITOT 1.2  PROT 8.6*  ALBUMIN 4.5   Coagulation Profile: No results for input(s): INR, PROTIME in the last 168 hours. BNP (last 3 results) No results for input(s): PROBNP in the last 8760 hours. CBG: No results for input(s): GLUCAP in the last 168  hours. Thyroid Function Tests: No results for input(s): TSH, T4TOTAL, FREET4, T3FREE, THYROIDAB in the last 72 hours. Urine analysis:    Component Value Date/Time   COLORURINE YELLOW 07/06/2024 1350   APPEARANCEUR CLEAR 07/06/2024 1350   LABSPEC >1.046 (H) 07/06/2024 1350   PHURINE 5.0 07/06/2024 1350   GLUCOSEU NEGATIVE 07/06/2024 1350   HGBUR NEGATIVE 07/06/2024 1350   BILIRUBINUR NEGATIVE 07/06/2024 1350   KETONESUR NEGATIVE 07/06/2024 1350   PROTEINUR NEGATIVE 07/06/2024 1350   NITRITE NEGATIVE 07/06/2024 1350   LEUKOCYTESUR NEGATIVE 07/06/2024 1350     Radiological Exams on Admission: CT ABDOMEN PELVIS W CONTRAST Result Date: 12/12/2024 EXAM: CT ABDOMEN AND PELVIS WITH CONTRAST 12/12/2024 04:15:11 PM TECHNIQUE: CT of the abdomen and pelvis was performed with the administration of 100 mL  of iohexol  (OMNIPAQUE ) 300 MG/ML solution. Multiplanar reformatted images are provided for review. Automated exposure control, iterative reconstruction, and/or weight-based adjustment of the mA/kV was utilized to reduce the radiation dose to as low as reasonably achievable. COMPARISON: Ultrasound abdomen 07/22/2024 and CT abdomen and pelvis 07/22/2024. CLINICAL HISTORY: Abdominal pain, acute, nonlocalized. FINDINGS: LOWER CHEST: Small left pleural effusion with focal basilar atelectasis or consolidation. Appearances are similar to the prior study. LIVER: The liver is unremarkable. GALLBLADDER AND BILE DUCTS: Gallbladder is unremarkable. No biliary ductal dilatation. SPLEEN: No acute abnormality. PANCREAS: No acute abnormality. ADRENAL GLANDS: No acute abnormality. KIDNEYS, URETERS AND BLADDER: The kidneys are symmetrical. No stones in the kidneys or ureters. No hydronephrosis or solid lesions. No perinephric or periureteral stranding. The bladder is normal. GI AND BOWEL: Small hiatal hernia. The stomach is moderately distended with fluid. No wall thickening. Small bowel is mildly distended and  fluid-filled to the level of the right lower quadrant, where there is decompression of the ileum. This likely indicates early or partial obstruction. No obstructing lesions are identified. Scattered stool throughout the colon without distention. PERITONEUM AND RETROPERITONEUM: No ascites. No free air. VASCULATURE: Calcification of the aorta. No aneurysm. LYMPH NODES: No lymphadenopathy. REPRODUCTIVE ORGANS: The prostate gland is not enlarged. BONES AND SOFT TISSUES: Degenerative changes in the spine. No acute osseous abnormality. No focal soft tissue abnormality. IMPRESSION: 1. Small bowel mildly distended and fluid-filled to the level of the right lower quadrant, where there is decompression of the ileum, likely indicating early or partial obstruction. No obstructing lesions are identified. 2. Moderately distended stomach with fluid, without wall thickening. 3. Small left pleural effusion with focal basilar atelectasis or consolidation, similar to the prior study. 4. Small hiatal hernia. Electronically signed by: Elsie Gravely MD 12/12/2024 04:43 PM EST RP Workstation: [id number]   DG Chest 2 View Result Date: 12/12/2024 CLINICAL DATA:  Abdominal pain EXAM: CHEST - 2 VIEW COMPARISON:  Chest radiograph dated 07/06/2024. FINDINGS: Left lung base linear atelectasis/scarring. No focal consolidation, pleural effusion or pneumothorax. The cardiac silhouette is within normal limits. No acute osseous pathology. IMPRESSION: No active cardiopulmonary disease. Electronically Signed   By: Vanetta Chou M.D.   On: 12/12/2024 14:08    EKG: Independently reviewed.  Sinus rhythm  Assessment/Plan Principal problem Early/partial SBO -admit patient to the hospital, n.p.o., IV fluids.  It has been more than 12 hours since he had any vomiting, currently does not feel nauseous, so hold off NG tube - I have asked EDP to consult general surgery, appreciate input.  Hold Ozempic  Active problems COPD -no wheezing, this  is stable, continue home inhalers  Essential hypertension -provide PRN, hold home medication since he is strictly n.p.o.  Leukocytosis -he does have a degree of chronic leukocytosis, I am not sure if this was ever worked up as an outpatient but I do not see any normal WBC in our system at least for the past 4 years  Obesity, morbid -patient would benefit from weight loss.  Hold Ozempic since is probably partially responsible for #1, with recurrent episode now as he resumed it and a prior episode when he started it several months ago  DVT prophylaxis: Lovenox   Code Status: Full code  Family Communication: no family at bedside  Bed Type: Medsurg Consults called: General Surgery per EDP  Obs/Inp: Observation   Nilda Fendt, MD, PhD [facility]  Contact via [email or web address]  12/12/2024, 5:21 PM         [1] No Known Allergies

## 2024-12-12 NOTE — ED Triage Notes (Signed)
 Generalized abdominal pain, N/V/D, heartburn that started yesterday. Hypertensive in triage, has not missed any BP meds at home.

## 2024-12-12 NOTE — ED Provider Triage Note (Signed)
 Emergency Medicine Provider Triage Evaluation Note  Joseph Morales , a 53 y.o. male  was evaluated in triage.  Pt complains of shortness of breath, vomiting and diarrhea.  Pt has chf.  He complains of abdominal swelling and pain  Review of Systems  Positive: Abdominal pain Negative: fever  Physical Exam  BP (!) 207/120   Pulse 99   Temp 99 F (37.2 C) (Oral)   Resp 20   Ht 6' 3 (1.905 m)   Wt (!) 154.2 kg   SpO2 94%   BMI 42.50 kg/m  Gen:   Awake, no distress   Resp:  Normal effort  MSK:   Moves extremities without difficulty  Other:    Medical Decision Making  Medically screening exam initiated at 1:27 PM.  Appropriate orders placed.  Wolm Gerre Arenas was informed that the remainder of the evaluation will be completed by another provider, this initial triage assessment does not replace that evaluation, and the importance of remaining in the ED until their evaluation is complete.     Flint Sonny POUR, PA-C 12/12/24 1328

## 2024-12-12 NOTE — ED Provider Notes (Signed)
 Patient care was taken over from Dr. Doretha.  Patient was awaiting CT scan.  CT scan shows early or partial small bowel obstruction.  Patient is otherwise well-appearing.  No recent vomiting.  Will hold off on NG tube at this point.  Discussed with Dr. Nilda who will admit the patient.  He request to consult surgery given his elevated WBC count..  Awaiting callback from general surgery.  Discussed with Dr. Dasie with surgery who will see the patient.   Lenor Hollering, MD 12/12/24 [phone]

## 2024-12-12 NOTE — Consult Note (Signed)
 Joseph Morales 1971-09-26  [id number].    Requesting MD: Andrea Ness, MD Chief Complaint/Reason for Consult: small bowel obstruction  HPI:  Joseph Morales is a 53 yo male who presented to the ED with abdominal pain, nausea and vomiting. He began having abdominal pain yesterday afternoon, which is diffuse. He had two episodes of vomiting around 2am this morning, but has not had any further vomiting since then and denies nausea currently. He had diarrhea yesterday but has not had any bowel movements or flatus today. Labs in the ED are significant for a WBC of 27, however on review of his previous labs and notes, he does have chronic leukocytosis. Last WBC in July was 17, and WBC was 48 in February of this year when he was admitted with pneumonia. A CT scan shows mild small bowel distension, consistent with a partial SBO. General surgery was consulted.  The patient has not had any prior abdominal surgeries. He denies any recent sick contacts. He is on Ozempic.  ROS: Review of Systems  Constitutional:  Negative for chills and fever.  Respiratory:  Negative for shortness of breath.   Gastrointestinal:  Positive for abdominal pain, diarrhea, nausea and vomiting.    Family History  Problem Relation Age of Onset   Breast cancer Mother    Diabetes Mother    Emphysema Maternal Grandmother    Congestive Heart Failure Maternal Grandmother     Past Medical History:  Diagnosis Date   Asthma    CHF (congestive heart failure) (HCC)    Chronic diastolic heart failure (HCC)    COPD (chronic obstructive pulmonary disease) (HCC)    Hypertension    Hypoventilation associated with obesity syndrome (HCC)    Obesity     Past Surgical History:  Procedure Laterality Date   NO PAST SURGERIES      Social History:  reports that he has quit smoking. His smoking use included cigarettes. He has a 20 pack-year smoking history. He has never used smokeless tobacco. He reports current drug use. Drug:  Marijuana. He reports that he does not drink alcohol.  Allergies: Allergies[1]  (Not in a hospital admission)    Physical Exam: Blood pressure (!) 168/90, pulse 87, temperature 98 F (36.7 C), temperature source Oral, resp. rate 16, height 6' 3 (1.905 m), weight (!) 154.2 kg, SpO2 94%. General: resting comfortably, appears stated age, no apparent distress Neurological: alert and oriented, no focal deficits HEENT: normocephalic, atraumatic CV: regular rate and rhythm Respiratory: normal work of breathing on room air Abdomen: protuberant but soft and nontender to palpation. No surgical scars noted. Extremities: warm and well-perfused, no deformities, moving all extremities spontaneously Psychiatric: normal mood and affect Skin: warm and dry   Results for orders placed or performed during the hospital encounter of 12/12/24 (from the past 48 hours)  CBC     Status: Abnormal   Collection Time: 12/12/24  2:11 PM  Result Value Ref Range   WBC 27.1 (H) 4.0 - 10.5 K/uL   RBC 5.72 4.22 - 5.81 MIL/uL   Hemoglobin 16.8 13.0 - 17.0 g/dL   HCT 47.1 (H) 60.9 - 47.9 %   MCV 92.3 80.0 - 100.0 fL   MCH 29.4 26.0 - 34.0 pg   MCHC 31.8 30.0 - 36.0 g/dL   RDW 87.0 88.4 - 84.4 %   Platelets 328 150 - 400 K/uL   nRBC 0.0 0.0 - 0.2 %    Comment: Performed at [facility], [address] [place], [place] [address]  Troponin T, High Sensitivity     Status: None   Collection Time: 12/12/24  2:11 PM  Result Value Ref Range   Troponin T High Sensitivity <15 0 - 19 ng/L    Comment: (NOTE) Biotin concentrations > 1000 ng/mL falsely decrease TnT results.  Serial cardiac troponin measurements are suggested.  Refer to the Links section for chest pain algorithms and additional  guidance. Performed at [facility], [address], [place], [place] [address]   Comprehensive metabolic panel     Status: Abnormal   Collection Time: 12/12/24  2:12 PM  Result Value  Ref Range   Sodium 139 135 - 145 mmol/L   Potassium 4.6 3.5 - 5.1 mmol/L   Chloride 99 98 - 111 mmol/L   CO2 28 22 - 32 mmol/L   Glucose, Bld 121 (H) 70 - 99 mg/dL    Comment: Glucose reference range applies only to samples taken after fasting for at least 8 hours.   BUN 19 6 - 20 mg/dL   Creatinine, Ser 8.90 0.61 - 1.24 mg/dL   Calcium  9.7 8.9 - 10.3 mg/dL   Total Protein 8.6 (H) 6.5 - 8.1 g/dL   Albumin 4.5 3.5 - 5.0 g/dL   AST 23 15 - 41 U/L    Comment: HEMOLYSIS AT THIS LEVEL MAY AFFECT RESULT   ALT 16 0 - 44 U/L   Alkaline Phosphatase 87 38 - 126 U/L   Total Bilirubin 1.2 0.0 - 1.2 mg/dL   GFR, Estimated >39 >39 mL/min    Comment: (NOTE) Calculated using the CKD-EPI Creatinine Equation (2021)    Anion gap 12 5 - 15    Comment: Performed at [facility], [address] [place], [place] [address]  Troponin T, High Sensitivity     Status: None   Collection Time: 12/12/24  6:15 PM  Result Value Ref Range   Troponin T High Sensitivity <15 0 - 19 ng/L    Comment: (NOTE) Biotin concentrations > 1000 ng/mL falsely decrease TnT results.  Serial cardiac troponin measurements are suggested.  Refer to the Links section for chest pain algorithms and additional  guidance. Performed at [facility], [address] [place], [place] [address]    CT ABDOMEN PELVIS W CONTRAST Result Date: 12/12/2024 EXAM: CT ABDOMEN AND PELVIS WITH CONTRAST 12/12/2024 04:15:11 PM TECHNIQUE: CT of the abdomen and pelvis was performed with the administration of 100 mL of iohexol  (OMNIPAQUE ) 300 MG/ML solution. Multiplanar reformatted images are provided for review. Automated exposure control, iterative reconstruction, and/or weight-based adjustment of the mA/kV was utilized to reduce the radiation dose to as low as reasonably achievable. COMPARISON: Ultrasound abdomen 07/22/2024 and CT abdomen and pelvis 07/22/2024. CLINICAL HISTORY: Abdominal pain, acute, nonlocalized.  FINDINGS: LOWER CHEST: Small left pleural effusion with focal basilar atelectasis or consolidation. Appearances are similar to the prior study. LIVER: The liver is unremarkable. GALLBLADDER AND BILE DUCTS: Gallbladder is unremarkable. No biliary ductal dilatation. SPLEEN: No acute abnormality. PANCREAS: No acute abnormality. ADRENAL GLANDS: No acute abnormality. KIDNEYS, URETERS AND BLADDER: The kidneys are symmetrical. No stones in the kidneys or ureters. No hydronephrosis or solid lesions. No perinephric or periureteral stranding. The bladder is normal. GI AND BOWEL: Small hiatal hernia. The stomach is moderately distended with fluid. No wall thickening. Small bowel is mildly distended and fluid-filled to the level of the right lower quadrant, where there is decompression of the ileum. This likely indicates early or  partial obstruction. No obstructing lesions are identified. Scattered stool throughout the colon without distention. PERITONEUM AND RETROPERITONEUM: No ascites. No free air. VASCULATURE: Calcification of the aorta. No aneurysm. LYMPH NODES: No lymphadenopathy. REPRODUCTIVE ORGANS: The prostate gland is not enlarged. BONES AND SOFT TISSUES: Degenerative changes in the spine. No acute osseous abnormality. No focal soft tissue abnormality. IMPRESSION: 1. Small bowel mildly distended and fluid-filled to the level of the right lower quadrant, where there is decompression of the ileum, likely indicating early or partial obstruction. No obstructing lesions are identified. 2. Moderately distended stomach with fluid, without wall thickening. 3. Small left pleural effusion with focal basilar atelectasis or consolidation, similar to the prior study. 4. Small hiatal hernia. Electronically signed by: Elsie Gravely MD 12/12/2024 04:43 PM EST RP Workstation: [id number]   DG Chest 2 View Result Date: 12/12/2024 CLINICAL DATA:  Abdominal pain EXAM: CHEST - 2 VIEW COMPARISON:  Chest radiograph dated 07/06/2024.  FINDINGS: Left lung base linear atelectasis/scarring. No focal consolidation, pleural effusion or pneumothorax. The cardiac silhouette is within normal limits. No acute osseous pathology. IMPRESSION: No active cardiopulmonary disease. Electronically Signed   By: Vanetta Chou M.D.   On: 12/12/2024 14:08      Assessment/Plan 53 yo male with no prior abdominal surgical history, presenting with nausea/vomiting and abdominal pain. I personally reviewed his labs, imaging and notes. His CT scan shows mild proximal small bowel dilation with distal decompression. Given his diarrhea this may represent gastroenteritis rather than a true obstruction. WBC is elevated but he does have chronic leukocytosis. Abdominal exam is benign, and there is no free fluid, pneumatosis or portal venous gas on imaging. No indication for emergent surgical intervention. Recommend supportive care. Ok to defer NG decompression for now as he has not had any nausea or vomiting for over 12 hours, but would place an NG tube if he has further emesis. Will proceed with a gastrografin  challenge by mouth if patient is able to tolerate, with follow up abdominal plain films in 8 hours. Surgery will follow while admitted.  Level of medical decision-making: Complex  Leonor Dawn, MD [facility] Surgery General, Hepatobiliary and Pancreatic Surgery 12/12/2024 7:55 PM      [1] No Known Allergies

## 2024-12-13 ENCOUNTER — Inpatient Hospital Stay (HOSPITAL_COMMUNITY)

## 2024-12-13 DIAGNOSIS — J4489 Other specified chronic obstructive pulmonary disease: Secondary | ICD-10-CM | POA: Diagnosis present

## 2024-12-13 DIAGNOSIS — E119 Type 2 diabetes mellitus without complications: Secondary | ICD-10-CM | POA: Diagnosis present

## 2024-12-13 DIAGNOSIS — E785 Hyperlipidemia, unspecified: Secondary | ICD-10-CM | POA: Diagnosis present

## 2024-12-13 DIAGNOSIS — Z8249 Family history of ischemic heart disease and other diseases of the circulatory system: Secondary | ICD-10-CM | POA: Diagnosis not present

## 2024-12-13 DIAGNOSIS — I5032 Chronic diastolic (congestive) heart failure: Secondary | ICD-10-CM | POA: Diagnosis present

## 2024-12-13 DIAGNOSIS — Z7985 Long-term (current) use of injectable non-insulin antidiabetic drugs: Secondary | ICD-10-CM | POA: Diagnosis not present

## 2024-12-13 DIAGNOSIS — Z7951 Long term (current) use of inhaled steroids: Secondary | ICD-10-CM | POA: Diagnosis not present

## 2024-12-13 DIAGNOSIS — Z79899 Other long term (current) drug therapy: Secondary | ICD-10-CM | POA: Diagnosis not present

## 2024-12-13 DIAGNOSIS — Z803 Family history of malignant neoplasm of breast: Secondary | ICD-10-CM | POA: Diagnosis not present

## 2024-12-13 DIAGNOSIS — I110 Hypertensive heart disease with heart failure: Secondary | ICD-10-CM | POA: Diagnosis present

## 2024-12-13 DIAGNOSIS — Z825 Family history of asthma and other chronic lower respiratory diseases: Secondary | ICD-10-CM | POA: Diagnosis not present

## 2024-12-13 DIAGNOSIS — Z87891 Personal history of nicotine dependence: Secondary | ICD-10-CM | POA: Diagnosis not present

## 2024-12-13 DIAGNOSIS — K56600 Partial intestinal obstruction, unspecified as to cause: Secondary | ICD-10-CM | POA: Diagnosis present

## 2024-12-13 DIAGNOSIS — K56609 Unspecified intestinal obstruction, unspecified as to partial versus complete obstruction: Secondary | ICD-10-CM | POA: Diagnosis present

## 2024-12-13 DIAGNOSIS — Z833 Family history of diabetes mellitus: Secondary | ICD-10-CM | POA: Diagnosis not present

## 2024-12-13 DIAGNOSIS — Z6841 Body Mass Index (BMI) 40.0 and over, adult: Secondary | ICD-10-CM | POA: Diagnosis not present

## 2024-12-13 DIAGNOSIS — E662 Morbid (severe) obesity with alveolar hypoventilation: Secondary | ICD-10-CM | POA: Diagnosis present

## 2024-12-13 DIAGNOSIS — D72829 Elevated white blood cell count, unspecified: Secondary | ICD-10-CM | POA: Diagnosis present

## 2024-12-13 LAB — CBC
HCT: 51.2 % (ref 39.0–52.0)
Hemoglobin: 15.8 g/dL (ref 13.0–17.0)
MCH: 28.6 pg (ref 26.0–34.0)
MCHC: 30.9 g/dL (ref 30.0–36.0)
MCV: 92.8 fL (ref 80.0–100.0)
Platelets: 325 K/uL (ref 150–400)
RBC: 5.52 MIL/uL (ref 4.22–5.81)
RDW: 13 % (ref 11.5–15.5)
WBC: 20.2 K/uL — ABNORMAL HIGH (ref 4.0–10.5)
nRBC: 0 % (ref 0.0–0.2)

## 2024-12-13 LAB — COMPREHENSIVE METABOLIC PANEL WITH GFR
ALT: 14 U/L (ref 0–44)
AST: 21 U/L (ref 15–41)
Albumin: 4.2 g/dL (ref 3.5–5.0)
Alkaline Phosphatase: 77 U/L (ref 38–126)
Anion gap: 8 (ref 5–15)
BUN: 18 mg/dL (ref 6–20)
CO2: 31 mmol/L (ref 22–32)
Calcium: 9 mg/dL (ref 8.9–10.3)
Chloride: 102 mmol/L (ref 98–111)
Creatinine, Ser: 1.07 mg/dL (ref 0.61–1.24)
GFR, Estimated: >60 mL/min (ref >60)
Glucose, Bld: 99 mg/dL (ref 70–99)
Potassium: 4 mmol/L (ref 3.5–5.1)
Sodium: 140 mmol/L (ref 135–145)
Total Bilirubin: 1.3 mg/dL — ABNORMAL HIGH (ref 0.0–1.2)
Total Protein: 7.8 g/dL (ref 6.5–8.1)

## 2024-12-13 MED ORDER — ENOXAPARIN SODIUM 40 MG/0.4ML IJ SOSY: 40.0000 mg | PREFILLED_SYRINGE | INTRAMUSCULAR | Status: DC

## 2024-12-13 MED ORDER — SODIUM CHLORIDE 0.9 % IV SOLN: INTRAVENOUS | Status: DC

## 2024-12-13 MED ORDER — FLUTICASONE FUROATE-VILANTEROL 100-25 MCG/ACT IN AEPB
1.0000 | INHALATION_SPRAY | Freq: Every day | RESPIRATORY_TRACT | Status: DC
  Administered 2024-12-14 – 2024-12-15 (×2): 1 via RESPIRATORY_TRACT
  Filled 2024-12-13: qty 28

## 2024-12-13 MED ORDER — ENOXAPARIN SODIUM 80 MG/0.8ML IJ SOSY
75.0000 mg | PREFILLED_SYRINGE | INTRAMUSCULAR | Status: DC
  Administered 2024-12-13 – 2024-12-15 (×3): 75 mg via SUBCUTANEOUS
  Filled 2024-12-13: qty 0.8
  Filled 2024-12-13: qty 0.75
  Filled 2024-12-13: qty 0.8

## 2024-12-13 NOTE — Plan of Care (Signed)

## 2024-12-13 NOTE — Progress Notes (Signed)
 Progress Note     Subjective: Pt reports some improvement in abdominal pain and mild improvement in distention. No flatus or BM. No nausea or vomiting. Frustrated that no provider saw him overnight and gastrografin  was not given overnight.   Objective: Vital signs in last 24 hours: Temp:  [97.4 F (36.3 C)-99 F (37.2 C)] 97.8 F (36.6 C) (12/16 0800) Pulse Rate:  [80-99] 80 (12/16 0800) Resp:  [16-20] 18 (12/16 0800) BP: (133-207)/(79-130) 133/80 (12/16 0800) SpO2:  [92 %-97 %] 94 % (12/16 0800) Weight:  [154.2 kg] 154.2 kg (12/15 1308)    Intake/Output from previous day: 12/15 0701 - 12/16 0700 In: 1050 [IV Piggyback:1050] Out: -  Intake/Output this shift: No intake/output data recorded.  PE: General: pleasant, WD, obese male who is laying in bed in NAD Heart: regular, rate, and rhythm.  Palpable pedal pulses bilaterally Lungs: Respiratory effort nonlabored Abd: soft, NT, mild distention Psych: A&Ox3 with an appropriate affect.    Lab Results:  Recent Labs    12/12/24 1411 12/13/24 0448  WBC 27.1* 20.2*  HGB 16.8 15.8  HCT 52.8* 51.2  PLT 328 325   BMET Recent Labs    12/12/24 1412 12/13/24 0448  NA 139 140  K 4.6 4.0  CL 99 102  CO2 28 31  GLUCOSE 121* 99  BUN 19 18  CREATININE 1.09 1.07  CALCIUM  9.7 9.0   PT/INR No results for input(s): LABPROT, INR in the last 72 hours. CMP     Component Value Date/Time   NA 140 12/13/2024 0448   NA 141 08/01/2020 1415   K 4.0 12/13/2024 0448   CL 102 12/13/2024 0448   CO2 31 12/13/2024 0448   GLUCOSE 99 12/13/2024 0448   BUN 18 12/13/2024 0448   BUN 14 08/01/2020 1415   CREATININE 1.07 12/13/2024 0448   CALCIUM  9.0 12/13/2024 0448   PROT 7.8 12/13/2024 0448   PROT 7.2 08/01/2020 1415   ALBUMIN 4.2 12/13/2024 0448   ALBUMIN 4.4 08/01/2020 1415   AST 21 12/13/2024 0448   ALT 14 12/13/2024 0448   ALKPHOS 77 12/13/2024 0448   BILITOT 1.3 (H) 12/13/2024 0448   BILITOT 0.9 08/01/2020 1415    GFRNONAA >60 12/13/2024 0448   GFRAA 109 08/01/2020 1415   Lipase     Component Value Date/Time   LIPASE 55 (H) 07/06/2024 0846       Studies/Results: CT ABDOMEN PELVIS W CONTRAST Result Date: 12/12/2024 EXAM: CT ABDOMEN AND PELVIS WITH CONTRAST 12/12/2024 04:15:11 PM TECHNIQUE: CT of the abdomen and pelvis was performed with the administration of 100 mL of iohexol  (OMNIPAQUE ) 300 MG/ML solution. Multiplanar reformatted images are provided for review. Automated exposure control, iterative reconstruction, and/or weight-based adjustment of the mA/kV was utilized to reduce the radiation dose to as low as reasonably achievable. COMPARISON: Ultrasound abdomen 07/22/2024 and CT abdomen and pelvis 07/22/2024. CLINICAL HISTORY: Abdominal pain, acute, nonlocalized. FINDINGS: LOWER CHEST: Small left pleural effusion with focal basilar atelectasis or consolidation. Appearances are similar to the prior study. LIVER: The liver is unremarkable. GALLBLADDER AND BILE DUCTS: Gallbladder is unremarkable. No biliary ductal dilatation. SPLEEN: No acute abnormality. PANCREAS: No acute abnormality. ADRENAL GLANDS: No acute abnormality. KIDNEYS, URETERS AND BLADDER: The kidneys are symmetrical. No stones in the kidneys or ureters. No hydronephrosis or solid lesions. No perinephric or periureteral stranding. The bladder is normal. GI AND BOWEL: Small hiatal hernia. The stomach is moderately distended with fluid. No wall thickening. Small bowel is mildly distended and  fluid-filled to the level of the right lower quadrant, where there is decompression of the ileum. This likely indicates early or partial obstruction. No obstructing lesions are identified. Scattered stool throughout the colon without distention. PERITONEUM AND RETROPERITONEUM: No ascites. No free air. VASCULATURE: Calcification of the aorta. No aneurysm. LYMPH NODES: No lymphadenopathy. REPRODUCTIVE ORGANS: The prostate gland is not enlarged. BONES AND SOFT  TISSUES: Degenerative changes in the spine. No acute osseous abnormality. No focal soft tissue abnormality. IMPRESSION: 1. Small bowel mildly distended and fluid-filled to the level of the right lower quadrant, where there is decompression of the ileum, likely indicating early or partial obstruction. No obstructing lesions are identified. 2. Moderately distended stomach with fluid, without wall thickening. 3. Small left pleural effusion with focal basilar atelectasis or consolidation, similar to the prior study. 4. Small hiatal hernia. Electronically signed by: Elsie Gravely MD 12/12/2024 04:43 PM EST RP Workstation: [id number]   DG Chest 2 View Result Date: 12/12/2024 CLINICAL DATA:  Abdominal pain EXAM: CHEST - 2 VIEW COMPARISON:  Chest radiograph dated 07/06/2024. FINDINGS: Left lung base linear atelectasis/scarring. No focal consolidation, pleural effusion or pneumothorax. The cardiac silhouette is within normal limits. No acute osseous pathology. IMPRESSION: No active cardiopulmonary disease. Electronically Signed   By: Vanetta Chou M.D.   On: 12/12/2024 14:08    Anti-infectives: Anti-infectives (From admission, onward)    None        Assessment/Plan SBO vs PSBO - CT with above, no obstruction lesions identified, moderately distended stomach with fluid  - no n/v overnight and pain and distention are improving, no bowel function yet either  - gastrografin  was ordered PO but not given overnight - discussed with RN already this AM and she will give PO - mobilize as tolerated - keep K>4.0, Mg>2.0 and Phos>3.0 to optimize bowel function  - no indication for emergent surgical intervention at this time   FEN: NPO, IVF per [facility] VTE: 75 mg LMWH QD ID: no current abx  - per [facility] -  HTN CHFpEF - ECHO from 03/2023 shows EF >55% although pt declined contrast so study was limited COPD Morbid Obesity - BMI 42.5    LOS: 0 days   I reviewed nursing notes, hospitalist notes, last 24 h  vitals and pain scores, last 48 h intake and output, last 24 h labs and trends, and last 24 h imaging results.  This care required moderate level of medical decision making.    Burnard JONELLE Louder, [facility] 12/13/2024, 8:51 AM Please see Amion for pager number during day hours 7:00am-4:30pm

## 2024-12-13 NOTE — Progress Notes (Signed)
 PROGRESS NOTE  Joseph Morales  FMW:[id number] DOB: Apr 16, 1971 DOA: 12/12/2024 PCP: Corinthia Isaiah Fellows, MD   Brief Narrative: Patient is a 53 year old male with past medical history of obesity, COPD, hypertension, hyperlipidemia , chronic diastolic CHF who presented with abdominal pain, nausea, vomiting, not passing any gas, feeling bloated.  Takes Ozempic at home.  On presentation, he was hypertensive.  Lab work showed WBC count of 27.  CT abdomen/pelvis showed early or partial small bowel obstruction without any obstructing lesion.  General surgery following  Assessment & Plan:  Principal Problem:   SBO (small bowel obstruction) (HCC)   Early/partial SBO: Presented with abdominal pain, nausea, vomiting.  Takes Ozempic at home.  Imaging showed stable partial obstruction per general surgery following.  Continue IV fluid, pain management, supportive care, antiemetics.  COPD: Currently not wheezing.  Continue bronchodilators as needed  Hypertension: Currently NPO.  Blood pressure stable  Leukocytosis: On reviewing his past lab works, he has chronic leukocytosis.  May need to follow-up with hematology as an outpatient.  No signs of infectious process at present  Morbid obesity: BMI of 42.5        DVT prophylaxis:Lovenox      Code Status: Full Code  Family Communication: None at bedside  Patient status:Inpatient  Patient is from :Home  Anticipated discharge un:Ynfz  Estimated DC date:1-2 days   Consultants: General surgery  Procedures: None  Antimicrobials:  Anti-infectives (From admission, onward)    None       Subjective: Patient seen and examined at bedside today.  Hemodynamically stable lying in bed.  Denied any severe abdominal discomfort.  Abdomen is distended and mostly tender.  Denies passing any gas.  Objective: Vitals:   12/13/24 0245 12/13/24 0500 12/13/24 0505 12/13/24 0800  BP: (!) 151/95 (!) 142/80  133/80  Pulse: 90 83  80  Resp: 17  17  18   Temp:   98.2 F (36.8 C) 97.8 F (36.6 C)  TempSrc:   Oral Oral  SpO2: 93% 95%  94%  Weight:      Height:        Intake/Output Summary (Last 24 hours) at 12/13/2024 0816 Last data filed at 12/12/2024 1609 Gross per 24 hour  Intake 1050 ml  Output --  Net 1050 ml   Filed Weights   12/12/24 1308  Weight: (!) 154.2 kg    Examination:  General exam: Overall comfortable, not in distress, morbidly obese HEENT: PERRL Respiratory system:  no wheezes or crackles  Cardiovascular system: S1 & S2 heard, RRR.  Gastrointestinal system: Abdomen is distended, mostly nontender.  Slow bowel sounds heard Central nervous system: Alert and oriented Extremities: No edema, no clubbing ,no cyanosis Skin: No rashes, no ulcers,no icterus     Data Reviewed: I have personally reviewed following labs and imaging studies  CBC: Recent Labs  Lab 12/12/24 1411 12/13/24 0448  WBC 27.1* 20.2*  HGB 16.8 15.8  HCT 52.8* 51.2  MCV 92.3 92.8  PLT 328 325   Basic Metabolic Panel: Recent Labs  Lab 12/12/24 1412 12/13/24 0448  NA 139 140  K 4.6 4.0  CL 99 102  CO2 28 31  GLUCOSE 121* 99  BUN 19 18  CREATININE 1.09 1.07  CALCIUM  9.7 9.0     No results found for this or any previous visit (from the past 240 hours).   Radiology Studies: CT ABDOMEN PELVIS W CONTRAST Result Date: 12/12/2024 EXAM: CT ABDOMEN AND PELVIS WITH CONTRAST 12/12/2024 04:15:11 PM TECHNIQUE: CT of the  abdomen and pelvis was performed with the administration of 100 mL of iohexol  (OMNIPAQUE ) 300 MG/ML solution. Multiplanar reformatted images are provided for review. Automated exposure control, iterative reconstruction, and/or weight-based adjustment of the mA/kV was utilized to reduce the radiation dose to as low as reasonably achievable. COMPARISON: Ultrasound abdomen 07/22/2024 and CT abdomen and pelvis 07/22/2024. CLINICAL HISTORY: Abdominal pain, acute, nonlocalized. FINDINGS: LOWER CHEST: Small left pleural  effusion with focal basilar atelectasis or consolidation. Appearances are similar to the prior study. LIVER: The liver is unremarkable. GALLBLADDER AND BILE DUCTS: Gallbladder is unremarkable. No biliary ductal dilatation. SPLEEN: No acute abnormality. PANCREAS: No acute abnormality. ADRENAL GLANDS: No acute abnormality. KIDNEYS, URETERS AND BLADDER: The kidneys are symmetrical. No stones in the kidneys or ureters. No hydronephrosis or solid lesions. No perinephric or periureteral stranding. The bladder is normal. GI AND BOWEL: Small hiatal hernia. The stomach is moderately distended with fluid. No wall thickening. Small bowel is mildly distended and fluid-filled to the level of the right lower quadrant, where there is decompression of the ileum. This likely indicates early or partial obstruction. No obstructing lesions are identified. Scattered stool throughout the colon without distention. PERITONEUM AND RETROPERITONEUM: No ascites. No free air. VASCULATURE: Calcification of the aorta. No aneurysm. LYMPH NODES: No lymphadenopathy. REPRODUCTIVE ORGANS: The prostate gland is not enlarged. BONES AND SOFT TISSUES: Degenerative changes in the spine. No acute osseous abnormality. No focal soft tissue abnormality. IMPRESSION: 1. Small bowel mildly distended and fluid-filled to the level of the right lower quadrant, where there is decompression of the ileum, likely indicating early or partial obstruction. No obstructing lesions are identified. 2. Moderately distended stomach with fluid, without wall thickening. 3. Small left pleural effusion with focal basilar atelectasis or consolidation, similar to the prior study. 4. Small hiatal hernia. Electronically signed by: Elsie Gravely MD 12/12/2024 04:43 PM EST RP Workstation: [id number]   DG Chest 2 View Result Date: 12/12/2024 CLINICAL DATA:  Abdominal pain EXAM: CHEST - 2 VIEW COMPARISON:  Chest radiograph dated 07/06/2024. FINDINGS: Left lung base linear  atelectasis/scarring. No focal consolidation, pleural effusion or pneumothorax. The cardiac silhouette is within normal limits. No acute osseous pathology. IMPRESSION: No active cardiopulmonary disease. Electronically Signed   By: Vanetta Chou M.D.   On: 12/12/2024 14:08    Scheduled Meds:  diatrizoate  meglumine -sodium  90 mL Oral Once   enoxaparin  (LOVENOX ) injection  75 mg Subcutaneous Q24H   fluticasone  furoate-vilanterol  1 puff Inhalation Daily   Continuous Infusions:  sodium chloride        LOS: 0 days   Ivonne Mustache, MD [facility] P12/16/2025, 8:16 AM

## 2024-12-14 DIAGNOSIS — K56609 Unspecified intestinal obstruction, unspecified as to partial versus complete obstruction: Secondary | ICD-10-CM | POA: Diagnosis not present

## 2024-12-14 LAB — CBC
HCT: 46 % (ref 39.0–52.0)
Hemoglobin: 14.4 g/dL (ref 13.0–17.0)
MCH: 28.5 pg (ref 26.0–34.0)
MCHC: 31.3 g/dL (ref 30.0–36.0)
MCV: 91.1 fL (ref 80.0–100.0)
Platelets: 312 K/uL (ref 150–400)
RBC: 5.05 MIL/uL (ref 4.22–5.81)
RDW: 12.8 % (ref 11.5–15.5)
WBC: 14 K/uL — ABNORMAL HIGH (ref 4.0–10.5)
nRBC: 0 % (ref 0.0–0.2)

## 2024-12-14 LAB — BASIC METABOLIC PANEL WITH GFR
Anion gap: 10 (ref 5–15)
BUN: 16 mg/dL (ref 6–20)
CO2: 28 mmol/L (ref 22–32)
Calcium: 8.9 mg/dL (ref 8.9–10.3)
Chloride: 101 mmol/L (ref 98–111)
Creatinine, Ser: 0.92 mg/dL (ref 0.61–1.24)
GFR, Estimated: >60 mL/min (ref >60)
Glucose, Bld: 78 mg/dL (ref 70–99)
Potassium: 3.8 mmol/L (ref 3.5–5.1)
Sodium: 138 mmol/L (ref 135–145)

## 2024-12-14 MED ORDER — SODIUM CHLORIDE 0.9 % IV SOLN: INTRAVENOUS | Status: DC

## 2024-12-14 NOTE — Plan of Care (Signed)

## 2024-12-14 NOTE — Progress Notes (Signed)
 PROGRESS NOTE  Joseph Morales  FMW:[id number] DOB: [birth year] DOA: 12/12/2024 PCP: Corinthia Isaiah Fellows, MD   Brief Narrative: Patient is a 53 year old male with past medical history of obesity, COPD, hypertension, hyperlipidemia , chronic diastolic CHF who presented with abdominal pain, nausea, vomiting, not passing any gas, feeling bloated.  Takes Ozempic at home.  On presentation, he was hypertensive.  Lab work showed WBC count of 27.  CT abdomen/pelvis showed early or partial small bowel obstruction without any obstructing lesion.  General surgery following.  Clinically improving.  Passing flatus, started on full liquid diet  Assessment & Plan:  Principal Problem:   SBO (small bowel obstruction) (HCC)   Early/partial SBO: Presented with abdominal pain, nausea, vomiting.  Takes Ozempic at home.  Imaging showed stable partial obstruction per general surgery following.   He said he had a bowel movement last night, passing gas.  Started on full liquid diet.  Bowel sounds still slow  COPD: Currently not wheezing.  Continue bronchodilators as needed  Hypertension:  Blood pressure stable  Leukocytosis: On reviewing his past lab works, he has chronic leukocytosis.  May need to follow-up with hematology as an outpatient.  No signs of infectious process at present.Improving.  Morbid obesity: BMI of 42.5        DVT prophylaxis:Lovenox      Code Status: Full Code  Family Communication: None at bedside  Patient status:Inpatient  Patient is from :Home  Anticipated discharge un:Ynfz  Estimated DC date:1-2 days   Consultants: General surgery  Procedures: None  Antimicrobials:  Anti-infectives (From admission, onward)    None       Subjective: Patient seen and examined at bedside today.  Hemodynamically stable.  Denies abdomen pain, nausea or vomiting today.  Had a bowel movement last night.  Passing gas.  Objective: Vitals:   12/13/24 1955 12/14/24 0052  12/14/24 0503 12/14/24 0906  BP: (!) 151/75 (!) 118/52 (!) 113/51   Pulse: 83 89 86   Resp: 18 18 18    Temp: 98.8 F (37.1 C) 98.1 F (36.7 C) (!) 97.5 F (36.4 C)   TempSrc: Oral Oral Oral   SpO2: 95% 92% 91% 91%  Weight:      Height:        Intake/Output Summary (Last 24 hours) at 12/14/2024 1254 Last data filed at 12/13/2024 1307 Gross per 24 hour  Intake --  Output 320 ml  Net -320 ml   Filed Weights   12/12/24 1308  Weight: (!) 154.2 kg    Examination:  General exam: Overall comfortable, not in distress,obese HEENT: PERRL Respiratory system:  no wheezes or crackles  Cardiovascular system: S1 & S2 heard, RRR.  Gastrointestinal system: Abdomen is nondistended, soft and nontender.Slow bowel sounds Central nervous system: Alert and oriented Extremities: No edema, no clubbing ,no cyanosis Skin: No rashes, no ulcers,no icterus      Data Reviewed: I have personally reviewed following labs and imaging studies  CBC: Recent Labs  Lab 12/12/24 1411 12/13/24 0448 12/14/24 0653  WBC 27.1* 20.2* 14.0*  HGB 16.8 15.8 14.4  HCT 52.8* 51.2 46.0  MCV 92.3 92.8 91.1  PLT 328 325 312   Basic Metabolic Panel: Recent Labs  Lab 12/12/24 1412 12/13/24 0448 12/14/24 0653  NA 139 140 138  K 4.6 4.0 3.8  CL 99 102 101  CO2 28 31 28   GLUCOSE 121* 99 78  BUN 19 18 16   CREATININE 1.09 1.07 0.92  CALCIUM  9.7 9.0 8.9  No results found for this or any previous visit (from the past 240 hours).   Radiology Studies: DG Abd Portable 1V-Small Bowel Obstruction Protocol-initial, 8 hr delay Result Date: 12/13/2024 EXAM: 1 VIEW XRAY OF THE ABDOMEN 12/13/2024 04:55:00 PM COMPARISON: CT abdomen and pelvis 12/12/2024. CLINICAL HISTORY: FINDINGS: BOWEL: Dilute contrast material is demonstrated throughout the small bowel with contrast material suggestive in the right colon. The small bowel appears dilated. This may represent partial obstruction. SOFT TISSUES: No abnormal  calcifications. BONES: No acute fracture. IMPRESSION: 1. Contrast material in the right colon, indicating no complete obstruction . 2. Dilated small bowel, suggestive of partial obstruction. Electronically signed by: Elsie Gravely MD 12/13/2024 04:59 PM EST RP Workstation: [id number]   CT ABDOMEN PELVIS W CONTRAST Result Date: 12/12/2024 EXAM: CT ABDOMEN AND PELVIS WITH CONTRAST 12/12/2024 04:15:11 PM TECHNIQUE: CT of the abdomen and pelvis was performed with the administration of 100 mL of iohexol  (OMNIPAQUE ) 300 MG/ML solution. Multiplanar reformatted images are provided for review. Automated exposure control, iterative reconstruction, and/or weight-based adjustment of the mA/kV was utilized to reduce the radiation dose to as low as reasonably achievable. COMPARISON: Ultrasound abdomen 07/22/2024 and CT abdomen and pelvis 07/22/2024. CLINICAL HISTORY: Abdominal pain, acute, nonlocalized. FINDINGS: LOWER CHEST: Small left pleural effusion with focal basilar atelectasis or consolidation. Appearances are similar to the prior study. LIVER: The liver is unremarkable. GALLBLADDER AND BILE DUCTS: Gallbladder is unremarkable. No biliary ductal dilatation. SPLEEN: No acute abnormality. PANCREAS: No acute abnormality. ADRENAL GLANDS: No acute abnormality. KIDNEYS, URETERS AND BLADDER: The kidneys are symmetrical. No stones in the kidneys or ureters. No hydronephrosis or solid lesions. No perinephric or periureteral stranding. The bladder is normal. GI AND BOWEL: Small hiatal hernia. The stomach is moderately distended with fluid. No wall thickening. Small bowel is mildly distended and fluid-filled to the level of the right lower quadrant, where there is decompression of the ileum. This likely indicates early or partial obstruction. No obstructing lesions are identified. Scattered stool throughout the colon without distention. PERITONEUM AND RETROPERITONEUM: No ascites. No free air. VASCULATURE: Calcification of the  aorta. No aneurysm. LYMPH NODES: No lymphadenopathy. REPRODUCTIVE ORGANS: The prostate gland is not enlarged. BONES AND SOFT TISSUES: Degenerative changes in the spine. No acute osseous abnormality. No focal soft tissue abnormality. IMPRESSION: 1. Small bowel mildly distended and fluid-filled to the level of the right lower quadrant, where there is decompression of the ileum, likely indicating early or partial obstruction. No obstructing lesions are identified. 2. Moderately distended stomach with fluid, without wall thickening. 3. Small left pleural effusion with focal basilar atelectasis or consolidation, similar to the prior study. 4. Small hiatal hernia. Electronically signed by: Elsie Gravely MD 12/12/2024 04:43 PM EST RP Workstation: [id number]   DG Chest 2 View Result Date: 12/12/2024 CLINICAL DATA:  Abdominal pain EXAM: CHEST - 2 VIEW COMPARISON:  Chest radiograph dated 07/06/2024. FINDINGS: Left lung base linear atelectasis/scarring. No focal consolidation, pleural effusion or pneumothorax. The cardiac silhouette is within normal limits. No acute osseous pathology. IMPRESSION: No active cardiopulmonary disease. Electronically Signed   By: Vanetta Chou M.D.   On: 12/12/2024 14:08    Scheduled Meds:  enoxaparin  (LOVENOX ) injection  75 mg Subcutaneous Q24H   fluticasone  furoate-vilanterol  1 puff Inhalation Daily   Continuous Infusions:     LOS: 1 day   Ivonne Mustache, MD [facility] P12/17/2025, 12:54 PM

## 2024-12-14 NOTE — Progress Notes (Signed)
 Subjective/Chief Complaint: No complaints.  Feels better.  Passing lots of flatus.   Objective: Vital signs in last 24 hours: Temp:  [97.5 F (36.4 C)-98.8 F (37.1 C)] 97.5 F (36.4 C) (12/17 0503) Pulse Rate:  [77-89] 86 (12/17 0503) Resp:  [14-18] 18 (12/17 0503) BP: (113-151)/(51-89) 113/51 (12/17 0503) SpO2:  [91 %-95 %] 91 % (12/17 0906) Last BM Date : 12/13/24  Intake/Output from previous day: 12/16 0701 - 12/17 0700 In: 210 [I.V.:210] Out: 620 [Urine:620] Intake/Output this shift: No intake/output data recorded.  General appearance: alert and cooperative Resp: clear to auscultation bilaterally Cardio: regular rate and rhythm GI: soft, nontender  Lab Results:  Recent Labs    12/13/24 0448 12/14/24 0653  WBC 20.2* 14.0*  HGB 15.8 14.4  HCT 51.2 46.0  PLT 325 312   BMET Recent Labs    12/13/24 0448 12/14/24 0653  NA 140 138  K 4.0 3.8  CL 102 101  CO2 31 28  GLUCOSE 99 78  BUN 18 16  CREATININE 1.07 0.92  CALCIUM  9.0 8.9   PT/INR No results for input(s): LABPROT, INR in the last 72 hours. ABG No results for input(s): PHART, HCO3 in the last 72 hours.  Invalid input(s): PCO2, PO2  Studies/Results: DG Abd Portable 1V-Small Bowel Obstruction Protocol-initial, 8 hr delay Result Date: 12/13/2024 EXAM: 1 VIEW XRAY OF THE ABDOMEN 12/13/2024 04:55:00 PM COMPARISON: CT abdomen and pelvis 12/12/2024. CLINICAL HISTORY: FINDINGS: BOWEL: Dilute contrast material is demonstrated throughout the small bowel with contrast material suggestive in the right colon. The small bowel appears dilated. This may represent partial obstruction. SOFT TISSUES: No abnormal calcifications. BONES: No acute fracture. IMPRESSION: 1. Contrast material in the right colon, indicating no complete obstruction . 2. Dilated small bowel, suggestive of partial obstruction. Electronically signed by: Elsie Gravely MD 12/13/2024 04:59 PM EST RP Workstation: [id number]   CT  ABDOMEN PELVIS W CONTRAST Result Date: 12/12/2024 EXAM: CT ABDOMEN AND PELVIS WITH CONTRAST 12/12/2024 04:15:11 PM TECHNIQUE: CT of the abdomen and pelvis was performed with the administration of 100 mL of iohexol  (OMNIPAQUE ) 300 MG/ML solution. Multiplanar reformatted images are provided for review. Automated exposure control, iterative reconstruction, and/or weight-based adjustment of the mA/kV was utilized to reduce the radiation dose to as low as reasonably achievable. COMPARISON: Ultrasound abdomen 07/22/2024 and CT abdomen and pelvis 07/22/2024. CLINICAL HISTORY: Abdominal pain, acute, nonlocalized. FINDINGS: LOWER CHEST: Small left pleural effusion with focal basilar atelectasis or consolidation. Appearances are similar to the prior study. LIVER: The liver is unremarkable. GALLBLADDER AND BILE DUCTS: Gallbladder is unremarkable. No biliary ductal dilatation. SPLEEN: No acute abnormality. PANCREAS: No acute abnormality. ADRENAL GLANDS: No acute abnormality. KIDNEYS, URETERS AND BLADDER: The kidneys are symmetrical. No stones in the kidneys or ureters. No hydronephrosis or solid lesions. No perinephric or periureteral stranding. The bladder is normal. GI AND BOWEL: Small hiatal hernia. The stomach is moderately distended with fluid. No wall thickening. Small bowel is mildly distended and fluid-filled to the level of the right lower quadrant, where there is decompression of the ileum. This likely indicates early or partial obstruction. No obstructing lesions are identified. Scattered stool throughout the colon without distention. PERITONEUM AND RETROPERITONEUM: No ascites. No free air. VASCULATURE: Calcification of the aorta. No aneurysm. LYMPH NODES: No lymphadenopathy. REPRODUCTIVE ORGANS: The prostate gland is not enlarged. BONES AND SOFT TISSUES: Degenerative changes in the spine. No acute osseous abnormality. No focal soft tissue abnormality. IMPRESSION: 1. Small bowel mildly distended and fluid-filled to  the  level of the right lower quadrant, where there is decompression of the ileum, likely indicating early or partial obstruction. No obstructing lesions are identified. 2. Moderately distended stomach with fluid, without wall thickening. 3. Small left pleural effusion with focal basilar atelectasis or consolidation, similar to the prior study. 4. Small hiatal hernia. Electronically signed by: Elsie Gravely MD 12/12/2024 04:43 PM EST RP Workstation: [id number]   DG Chest 2 View Result Date: 12/12/2024 CLINICAL DATA:  Abdominal pain EXAM: CHEST - 2 VIEW COMPARISON:  Chest radiograph dated 07/06/2024. FINDINGS: Left lung base linear atelectasis/scarring. No focal consolidation, pleural effusion or pneumothorax. The cardiac silhouette is within normal limits. No acute osseous pathology. IMPRESSION: No active cardiopulmonary disease. Electronically Signed   By: Vanetta Chou M.D.   On: 12/12/2024 14:08    Anti-infectives: Anti-infectives (From admission, onward)    None       Assessment/Plan: s/p * No surgery found * Advance diet Sbo seems to be resolving If he tolerates diet then we will sign off  LOS: 1 day    Joseph Morales 12/14/2024

## 2024-12-15 LAB — CBC
HCT: 44.7 % (ref 39.0–52.0)
Hemoglobin: 14.1 g/dL (ref 13.0–17.0)
MCH: 28.7 pg (ref 26.0–34.0)
MCHC: 31.5 g/dL (ref 30.0–36.0)
MCV: 90.9 fL (ref 80.0–100.0)
Platelets: 281 K/uL (ref 150–400)
RBC: 4.92 MIL/uL (ref 4.22–5.81)
RDW: 12.7 % (ref 11.5–15.5)
WBC: 14.2 K/uL — ABNORMAL HIGH (ref 4.0–10.5)
nRBC: 0 % (ref 0.0–0.2)

## 2024-12-15 NOTE — Discharge Summary (Signed)
 Physician Discharge Summary  Joseph Morales FMW:[id number] DOB: October 10, 1971 DOA: 12/12/2024  PCP: [place]ia Isaiah Fellows, MD  Admit date: 12/12/2024 Discharge date: 12/15/2024  Admitted From: Home Disposition:  Home  Discharge Condition:Stable CODE STATUS:FULL Diet recommendation: soft diet for next 2-3 days   Brief/Interim Summary: Patient is a 53 year old male with past medical history of obesity, COPD, hypertension, hyperlipidemia , chronic diastolic CHF who presented with abdominal pain, nausea, vomiting, not passing any gas, feeling bloated.  Takes Ozempic at home.  On presentation, he was hypertensive.  Lab work showed WBC count of 27.  CT abdomen/pelvis showed early or partial small bowel obstruction without any obstructing lesion.  General surgery following.  Small bowel obstruction has resolved.  He started having multiple bowel movements since yesterday.  Abdomen is soft and nontender on examination.  Medically stable for discharge home today  Following problems were addressed during the hospitalization:  Early/partial SBO: Presented with abdominal pain, nausea, vomiting.  Takes Ozempic at home.  Imaging showed stable partial obstruction per general surgery following.   Now having multiple bowel movements.  No abdominal pain, nausea or vomiting.  Diet advanced to soft.   COPD: Currently not wheezing.  Continue home bronchodilators   Hypertension: Takes Coreg , losartan , Lasix  at home.  Currently blood pressure stable without any medications Has history of diastolic CHF.  Currently looks euvolemic   Leukocytosis: On reviewing his past lab works, he has chronic leukocytosis.  May need to follow-up with hematology as an outpatient.  No signs of infectious process at present.leukocytosis improved.  Check CBC in a week   Morbid obesity: BMI of 42.5   Discharge Diagnoses:  Principal Problem:   SBO (small bowel obstruction) [facility])    Discharge Instructions  Discharge  Instructions     Discharge instructions   Complete by: As directed    1)Please take soft diet for the next 2 to 3 days 2)Follow up with your PCP next week.  Do a CBC test during the follow up 3)You are on multiple medications at home like Coreg , Lasix , losartan  at home.  This medications can lower your blood pressure.  Your blood pressures have been stable here without any medications.  Continue to monitor your blood pressure at home.  Discuss with your  PCP if you  need to continue these medications   Increase activity slowly   Complete by: As directed       Allergies as of 12/15/2024   No Known Allergies      Medication List     TAKE these medications    budesonide -formoterol  160-4.5 MCG/ACT inhaler Commonly known as: SYMBICORT  INHALE 2 PUFFS INTO THE LUNGS IN THE MORNING AND AT BEDTIME.   carvedilol  12.5 MG tablet Commonly known as: COREG  Take 0.5 tablets (6.25 mg total) by mouth 2 (two) times daily. What changed: how much to take   clobetasol cream 0.05 % Commonly known as: TEMOVATE Apply 1 Application topically daily as needed (eczema).   [employer] Melatonin 5 MG Tabs Generic drug: melatonin Take 5 mg by mouth at bedtime as needed (sleep).   fluticasone  50 MCG/ACT nasal spray Commonly known as: FLONASE  Place 1 spray into both nostrils daily as needed for allergies.   furosemide  40 MG tablet Commonly known as: LASIX  Take 80 mg by mouth 2 (two) times daily.   losartan  25 MG tablet Commonly known as: COZAAR  Take 25 mg by mouth daily.   Maalox Max 400-400-40 MG/5ML suspension Generic drug: alum & mag hydroxide-simeth Take 15  mLs by mouth every 6 (six) hours as needed for indigestion.   ondansetron  4 MG disintegrating tablet Commonly known as: ZOFRAN -ODT Take 1 tablet (4 mg total) by mouth every 8 (eight) hours as needed.   oxyCODONE  5 MG immediate release tablet Commonly known as: Roxicodone  Take 1 tablet (5 mg total) by mouth every 4 (four) hours as needed for  severe pain (pain score 7-10).   OXYGEN Inhale 2-4 L into the lungs at bedtime.   pantoprazole  40 MG tablet Commonly known as: Protonix  Take 1 tablet (40 mg total) by mouth daily.   Wellbutrin  XL 300 MG 24 hr tablet Generic drug: buPROPion  Take 300 mg by mouth daily.        Follow-up Information     Hollowell, Isaiah Fellows, MD. Schedule an appointment as soon as possible for a visit in 1 week(s).   Specialty: Family Medicine Contact information: [address] [place] [address] [phone]                Allergies[1]  Consultations: General Surgery   Procedures/Studies: DG Abd Portable 1V-Small Bowel Obstruction Protocol-initial, 8 hr delay Result Date: 12/13/2024 EXAM: 1 VIEW XRAY OF THE ABDOMEN 12/13/2024 04:55:00 PM COMPARISON: CT abdomen and pelvis 12/12/2024. CLINICAL HISTORY: FINDINGS: BOWEL: Dilute contrast material is demonstrated throughout the small bowel with contrast material suggestive in the right colon. The small bowel appears dilated. This may represent partial obstruction. SOFT TISSUES: No abnormal calcifications. BONES: No acute fracture. IMPRESSION: 1. Contrast material in the right colon, indicating no complete obstruction . 2. Dilated small bowel, suggestive of partial obstruction. Electronically signed by: Elsie Gravely MD 12/13/2024 04:59 PM EST RP Workstation: [id number]   CT ABDOMEN PELVIS W CONTRAST Result Date: 12/12/2024 EXAM: CT ABDOMEN AND PELVIS WITH CONTRAST 12/12/2024 04:15:11 PM TECHNIQUE: CT of the abdomen and pelvis was performed with the administration of 100 mL of iohexol  (OMNIPAQUE ) 300 MG/ML solution. Multiplanar reformatted images are provided for review. Automated exposure control, iterative reconstruction, and/or weight-based adjustment of the mA/kV was utilized to reduce the radiation dose to as low as reasonably achievable. COMPARISON: Ultrasound abdomen 07/22/2024 and CT abdomen and pelvis  07/22/2024. CLINICAL HISTORY: Abdominal pain, acute, nonlocalized. FINDINGS: LOWER CHEST: Small left pleural effusion with focal basilar atelectasis or consolidation. Appearances are similar to the prior study. LIVER: The liver is unremarkable. GALLBLADDER AND BILE DUCTS: Gallbladder is unremarkable. No biliary ductal dilatation. SPLEEN: No acute abnormality. PANCREAS: No acute abnormality. ADRENAL GLANDS: No acute abnormality. KIDNEYS, URETERS AND BLADDER: The kidneys are symmetrical. No stones in the kidneys or ureters. No hydronephrosis or solid lesions. No perinephric or periureteral stranding. The bladder is normal. GI AND BOWEL: Small hiatal hernia. The stomach is moderately distended with fluid. No wall thickening. Small bowel is mildly distended and fluid-filled to the level of the right lower quadrant, where there is decompression of the ileum. This likely indicates early or partial obstruction. No obstructing lesions are identified. Scattered stool throughout the colon without distention. PERITONEUM AND RETROPERITONEUM: No ascites. No free air. VASCULATURE: Calcification of the aorta. No aneurysm. LYMPH NODES: No lymphadenopathy. REPRODUCTIVE ORGANS: The prostate gland is not enlarged. BONES AND SOFT TISSUES: Degenerative changes in the spine. No acute osseous abnormality. No focal soft tissue abnormality. IMPRESSION: 1. Small bowel mildly distended and fluid-filled to the level of the right lower quadrant, where there is decompression of the ileum, likely indicating early or partial obstruction. No obstructing lesions are identified. 2. Moderately distended stomach with fluid,  without wall thickening. 3. Small left pleural effusion with focal basilar atelectasis or consolidation, similar to the prior study. 4. Small hiatal hernia. Electronically signed by: Elsie Gravely MD 12/12/2024 04:43 PM EST RP Workstation: [id number]   DG Chest 2 View Result Date: 12/12/2024 CLINICAL DATA:  Abdominal pain  EXAM: CHEST - 2 VIEW COMPARISON:  Chest radiograph dated 07/06/2024. FINDINGS: Left lung base linear atelectasis/scarring. No focal consolidation, pleural effusion or pneumothorax. The cardiac silhouette is within normal limits. No acute osseous pathology. IMPRESSION: No active cardiopulmonary disease. Electronically Signed   By: Vanetta Chou M.D.   On: 12/12/2024 14:08      Subjective: Patient seen and examined the bedside today.  Hemodynamically stable.  Comfortable.  Lying on bed.  Having multiple bowel movements since yesterday.  No abdominal pain, nausea or vomiting.  Medically stable for discharge  Discharge Exam: Vitals:   12/15/24 0349 12/15/24 0918  BP: (!) 121/57   Pulse: 73   Resp: 18   Temp: 98.3 F (36.8 C)   SpO2: 92% 92%   Vitals:   12/14/24 1339 12/14/24 1953 12/15/24 0349 12/15/24 0918  BP: 113/65 106/71 (!) 121/57   Pulse: 76 74 73   Resp: 16 18 18    Temp: 98.6 F (37 C) 98.6 F (37 C) 98.3 F (36.8 C)   TempSrc: Oral Oral Oral   SpO2: 93% 95% 92% 92%  Weight:      Height:        General: Pt is alert, awake, not in acute distress, morbidly obese Cardiovascular: RRR, S1/S2 +, no rubs, no gallops Respiratory: CTA bilaterally, no wheezing, no rhonchi Abdominal: Soft, NT, ND, bowel sounds + Extremities: no edema, no cyanosis    The results of significant diagnostics from this hospitalization (including imaging, microbiology, ancillary and laboratory) are listed below for reference.     Microbiology: No results found for this or any previous visit (from the past 240 hours).   Labs: BNP (last 3 results) Recent Labs    02/02/24 1314 02/03/24 1620  BNP 95.8 143.7*   Basic Metabolic Panel: Recent Labs  Lab 12/12/24 1412 12/13/24 0448 12/14/24 0653  NA 139 140 138  K 4.6 4.0 3.8  CL 99 102 101  CO2 28 31 28   GLUCOSE 121* 99 78  BUN 19 18 16   CREATININE 1.09 1.07 0.92  CALCIUM  9.7 9.0 8.9   Liver Function Tests: Recent Labs  Lab  12/12/24 1412 12/13/24 0448  AST 23 21  ALT 16 14  ALKPHOS 87 77  BILITOT 1.2 1.3*  PROT 8.6* 7.8  ALBUMIN 4.5 4.2   No results for input(s): LIPASE, AMYLASE in the last 168 hours. No results for input(s): AMMONIA in the last 168 hours. CBC: Recent Labs  Lab 12/12/24 1411 12/13/24 0448 12/14/24 0653 12/15/24 0601  WBC 27.1* 20.2* 14.0* 14.2*  HGB 16.8 15.8 14.4 14.1  HCT 52.8* 51.2 46.0 44.7  MCV 92.3 92.8 91.1 90.9  PLT 328 325 312 281   Cardiac Enzymes: No results for input(s): CKTOTAL, CKMB, CKMBINDEX, TROPONINI in the last 168 hours. BNP: Invalid input(s): POCBNP CBG: No results for input(s): GLUCAP in the last 168 hours. D-Dimer No results for input(s): DDIMER in the last 72 hours. Hgb A1c No results for input(s): HGBA1C in the last 72 hours. Lipid Profile No results for input(s): CHOL, HDL, LDLCALC, TRIG, CHOLHDL, LDLDIRECT in the last 72 hours. Thyroid function studies No results for input(s): TSH, T4TOTAL, T3FREE, THYROIDAB in the last 72 hours.  Invalid input(s): FREET3 Anemia work up No results for input(s): VITAMINB12, FOLATE, FERRITIN, TIBC, IRON, RETICCTPCT in the last 72 hours. Urinalysis    Component Value Date/Time   COLORURINE YELLOW 07/06/2024 1350   APPEARANCEUR CLEAR 07/06/2024 1350   LABSPEC >1.046 (H) 07/06/2024 1350   PHURINE 5.0 07/06/2024 1350   GLUCOSEU NEGATIVE 07/06/2024 1350   HGBUR NEGATIVE 07/06/2024 1350   BILIRUBINUR NEGATIVE 07/06/2024 1350   KETONESUR NEGATIVE 07/06/2024 1350   PROTEINUR NEGATIVE 07/06/2024 1350   NITRITE NEGATIVE 07/06/2024 1350   LEUKOCYTESUR NEGATIVE 07/06/2024 1350   Sepsis Labs Recent Labs  Lab 12/12/24 1411 12/13/24 0448 12/14/24 0653 12/15/24 0601  WBC 27.1* 20.2* 14.0* 14.2*   Microbiology No results found for this or any previous visit (from the past 240 hours).  Please note: You were cared for by a hospitalist during your hospital  stay. Once you are discharged, your primary care physician will handle any further medical issues. Please note that NO REFILLS for any discharge medications will be authorized once you are discharged, as it is imperative that you return to your primary care physician (or establish a relationship with a primary care physician if you do not have one) for your post hospital discharge needs so that they can reassess your need for medications and monitor your lab values.    Time coordinating discharge: 40 minutes  SIGNED:   Ivonne Mustache, MD  [facility] 12/15/2024, 11:55 AM Pager [phone]  If 7PM-7AM, please contact night-coverage [email or web address] Password [email or web address]    [1] No Known Allergies

## 2024-12-15 NOTE — Plan of Care (Signed)

## 2024-12-15 NOTE — Progress Notes (Signed)
° °  Subjective/Chief Complaint: No complaints.  Passing flatus.   Objective: Vital signs in last 24 hours: Temp:  [98.3 F (36.8 C)-98.6 F (37 C)] 98.3 F (36.8 C) (12/18 0349) Pulse Rate:  [73-76] 73 (12/18 0349) Resp:  [16-18] 18 (12/18 0349) BP: (106-121)/(57-71) 121/57 (12/18 0349) SpO2:  [92 %-95 %] 92 % (12/18 0918) Last BM Date : 12/13/24  Intake/Output from previous day: 12/17 0701 - 12/18 0700 In: 895 [P.O.:820; I.V.:75] Out: 750 [Urine:750] Intake/Output this shift: No intake/output data recorded.  General appearance: alert and cooperative Resp: clear to auscultation bilaterally Cardio: regular rate and rhythm GI: soft, nontender  Lab Results:  Recent Labs    12/14/24 0653 12/15/24 0601  WBC 14.0* 14.2*  HGB 14.4 14.1  HCT 46.0 44.7  PLT 312 281   BMET Recent Labs    12/13/24 0448 12/14/24 0653  NA 140 138  K 4.0 3.8  CL 102 101  CO2 31 28  GLUCOSE 99 78  BUN 18 16  CREATININE 1.07 0.92  CALCIUM  9.0 8.9   PT/INR No results for input(s): LABPROT, INR in the last 72 hours. ABG No results for input(s): PHART, HCO3 in the last 72 hours.  Invalid input(s): PCO2, PO2  Studies/Results: DG Abd Portable 1V-Small Bowel Obstruction Protocol-initial, 8 hr delay Result Date: 12/13/2024 EXAM: 1 VIEW XRAY OF THE ABDOMEN 12/13/2024 04:55:00 PM COMPARISON: CT abdomen and pelvis 12/12/2024. CLINICAL HISTORY: FINDINGS: BOWEL: Dilute contrast material is demonstrated throughout the small bowel with contrast material suggestive in the right colon. The small bowel appears dilated. This may represent partial obstruction. SOFT TISSUES: No abnormal calcifications. BONES: No acute fracture. IMPRESSION: 1. Contrast material in the right colon, indicating no complete obstruction . 2. Dilated small bowel, suggestive of partial obstruction. Electronically signed by: Elsie Gravely MD 12/13/2024 04:59 PM EST RP Workstation: [id number]     Anti-infectives: Anti-infectives (From admission, onward)    None       Assessment/Plan: s/p * No surgery found * SBO seems to have resolved Advance diet as tolerated No indication for surgery at this point.  Will sign off  LOS: 2 days    Joseph Morales 12/15/2024
